# Patient Record
Sex: Female | Born: 1965
Health system: Southern US, Community
[De-identification: ages and names within clinical notes are randomized; demographics above are authoritative.]

## PROBLEM LIST (undated history)

## (undated) DIAGNOSIS — M069 Rheumatoid arthritis, unspecified: Secondary | ICD-10-CM

## (undated) DIAGNOSIS — M1990 Unspecified osteoarthritis, unspecified site: Secondary | ICD-10-CM

## (undated) DIAGNOSIS — M5136 Other intervertebral disc degeneration, lumbar region: Secondary | ICD-10-CM

## (undated) DIAGNOSIS — J45909 Unspecified asthma, uncomplicated: Secondary | ICD-10-CM

## (undated) DIAGNOSIS — M818 Other osteoporosis without current pathological fracture: Secondary | ICD-10-CM

## (undated) DIAGNOSIS — M51369 Other intervertebral disc degeneration, lumbar region without mention of lumbar back pain or lower extremity pain: Secondary | ICD-10-CM

## (undated) DIAGNOSIS — G479 Sleep disorder, unspecified: Secondary | ICD-10-CM

## (undated) DIAGNOSIS — M810 Age-related osteoporosis without current pathological fracture: Secondary | ICD-10-CM

## (undated) HISTORY — PX: ELBOW SURGERY: SHX618

---

## 2015-07-07 ENCOUNTER — Emergency Department (HOSPITAL_COMMUNITY): Payer: Self-pay

## 2015-07-07 ENCOUNTER — Encounter (HOSPITAL_COMMUNITY): Payer: Self-pay | Admitting: Emergency Medicine

## 2015-07-07 ENCOUNTER — Emergency Department (HOSPITAL_COMMUNITY)
Admission: EM | Admit: 2015-07-07 | Discharge: 2015-07-07 | Disposition: A | Discharge status: Home / Self Care | Payer: Self-pay | Attending: Emergency Medicine | Admitting: Emergency Medicine

## 2015-07-07 DIAGNOSIS — M5442 Lumbago with sciatica, left side: Secondary | ICD-10-CM | POA: Insufficient documentation

## 2015-07-07 DIAGNOSIS — M1990 Unspecified osteoarthritis, unspecified site: Secondary | ICD-10-CM | POA: Insufficient documentation

## 2015-07-07 DIAGNOSIS — F17210 Nicotine dependence, cigarettes, uncomplicated: Secondary | ICD-10-CM | POA: Insufficient documentation

## 2015-07-07 HISTORY — DX: Unspecified osteoarthritis, unspecified site: M19.90

## 2015-07-07 MED ORDER — CYCLOBENZAPRINE HCL 10 MG PO TABS
10.0000 mg | ORAL_TABLET | Freq: Once | ORAL | Status: AC
  Administered 2015-07-07: 10 mg via ORAL
  Filled 2015-07-07: qty 1

## 2015-07-07 MED ORDER — KETOROLAC TROMETHAMINE 60 MG/2ML IM SOLN
60.0000 mg | Freq: Once | INTRAMUSCULAR | Status: AC
  Administered 2015-07-07: 60 mg via INTRAMUSCULAR
  Filled 2015-07-07: qty 2

## 2015-07-07 MED ORDER — CYCLOBENZAPRINE HCL 10 MG PO TABS: 10.0000 mg | ORAL_TABLET | Freq: Three times a day (TID) | ORAL | Status: DC | PRN

## 2015-07-07 MED ORDER — OXYCODONE-ACETAMINOPHEN 5-325 MG PO TABS
1.0000 | ORAL_TABLET | Freq: Once | ORAL | Status: AC
  Administered 2015-07-07: 1 via ORAL
  Filled 2015-07-07: qty 1

## 2015-07-07 MED ORDER — PREDNISONE 50 MG PO TABS
60.0000 mg | ORAL_TABLET | Freq: Once | ORAL | Status: AC
  Administered 2015-07-07: 60 mg via ORAL
  Filled 2015-07-07: qty 1

## 2015-07-07 MED ORDER — OXYCODONE-ACETAMINOPHEN 5-325 MG PO TABS: 1.0000 | ORAL_TABLET | ORAL | Status: DC | PRN

## 2015-07-07 NOTE — ED Notes (Signed)
MD at bedside. 

## 2015-07-07 NOTE — ED Notes (Signed)
Pt reports LT lower back pain after a fall getting into the shower. States she injured it initially a few weeks ago. States pain radiates to LT buttock and down LT leg. Pt ambulatory.

## 2015-07-07 NOTE — ED Provider Notes (Signed)
CSN: [id number]     Arrival date & time 07/07/15  1129 History   First MD Initiated Contact with Patient 07/07/15 1148     Chief Complaint  Patient presents with  . Back Pain     (Consider location/radiation/quality/duration/timing/severity/associated sxs/prior Treatment) HPI   Amy Hall is a 50 y.o. female with chronic low back pain who presents to the Emergency Department complaining of worsening left lower back pain that began suddenly after a fall in the shower earlier on the day of arrival.  She reports a prior injury several weeks ago and was seen at [facility].  She was treated with pain medications with minimal relief.  She has scheduled an appt with neurology in July.  She describes a sharp radiating pain to her left buttock and into her leg.  Pain worse with walking.  She denies fever, chills, abdominal pain, numbness or weakness of the LE, urine or bowel changes.   Past Medical History  Diagnosis Date  . Arthritis    Past Surgical History  Procedure Laterality Date  . Elbow surgery     No family history on file. Social History  Substance Use Topics  . Smoking status: Current Every Day Smoker -- 0.50 packs/day    Types: Cigarettes  . Smokeless tobacco: None  . Alcohol Use: No   OB History    No data available     Review of Systems  Constitutional: Negative for fever.  Respiratory: Negative for shortness of breath.   Gastrointestinal: Negative for vomiting, abdominal pain and constipation.  Genitourinary: Negative for dysuria, hematuria, flank pain, decreased urine volume and difficulty urinating.  Musculoskeletal: Positive for back pain. Negative for joint swelling.  Skin: Negative for rash.  Neurological: Negative for weakness and numbness.  All other systems reviewed and are negative.     Allergies  Review of patient's allergies indicates no known allergies.  Home Medications   Prior to Admission medications   Not on File   BP 113/78  mmHg  Pulse 88  Temp(Src) 98.6 F (37 C) (Oral)  Resp 20  Ht 5\' 3"  (1.6 m)  Wt 58.968 kg  BMI 23.03 kg/m2  SpO2 98%  LMP 01/16/2015 Physical Exam  Constitutional: She is oriented to person, place, and time. She appears well-developed and well-nourished. No distress.  HENT:  Head: Normocephalic and atraumatic.  Neck: Normal range of motion. Neck supple.  Cardiovascular: Normal rate, regular rhythm, normal heart sounds and intact distal pulses.   No murmur heard. Pulmonary/Chest: Effort normal and breath sounds normal. No respiratory distress.  Abdominal: Soft. She exhibits no distension. There is no tenderness.  Musculoskeletal: She exhibits tenderness. She exhibits no edema.       Lumbar back: She exhibits tenderness and pain. She exhibits normal range of motion, no swelling, no deformity, no laceration and normal pulse.  ttp of the lumbar spine and left lumbar paraspinal muscles.  DP pulses are brisk and symmetrical.  Distal sensation intact.  Pt has 4/5 strength against resistance of bilateral lower extremities.     Neurological: She is alert and oriented to person, place, and time. She has normal strength. No sensory deficit. She exhibits normal muscle tone. Coordination and gait normal.  Reflex Scores:      Patellar reflexes are 2+ on the right side and 2+ on the left side.      Achilles reflexes are 2+ on the right side and 2+ on the left side. Skin: Skin is warm and dry. No rash  noted.  Nursing note and vitals reviewed.   ED Course  Procedures (including critical care time) Labs Review Labs Reviewed - No data to display  Imaging Review Dg Lumbar Spine Complete  07/07/2015  CLINICAL DATA:  Fall today.  Back pain EXAM: LUMBAR SPINE - COMPLETE 4+ VIEW COMPARISON:  None. FINDINGS: Mild dextroscoliosis. Normal alignment. Mild fracture of L1 which appears chronic. No acute fracture. Disc degeneration and spurring on the left at L3-4 and on the right L4-5. No pars defect.  IMPRESSION: Chronic fracture of L1.  No acute fracture. Electronically Signed   By: Franchot Gallo M.D.   On: 07/07/2015 12:42    I have personally reviewed and evaluated these images and lab results as part of my medical decision-making.   EKG Interpretation None      MDM   Final diagnoses:  Left-sided low back pain with left-sided sciatica    Pt well appearing.  No focal neuor deficits.  No concerning sx's for emergent neurological process.  Pt ambulates with a slightly antalgic gait.  No foot drop  Feeling better after medication. Return precautions given     Pt has upcoming appt with neurology.     Kem Parkinson, PA-C 07/09/15 2154  Forde Dandy, MD 07/12/15 [phone]

## 2015-07-22 ENCOUNTER — Emergency Department (HOSPITAL_COMMUNITY)
Admission: EM | Admit: 2015-07-22 | Discharge: 2015-07-22 | Disposition: A | Discharge status: Home / Self Care | Payer: Medicaid Other | Attending: Emergency Medicine | Admitting: Emergency Medicine

## 2015-07-22 ENCOUNTER — Encounter (HOSPITAL_COMMUNITY): Payer: Self-pay | Admitting: Emergency Medicine

## 2015-07-22 DIAGNOSIS — M1990 Unspecified osteoarthritis, unspecified site: Secondary | ICD-10-CM | POA: Insufficient documentation

## 2015-07-22 DIAGNOSIS — M5442 Lumbago with sciatica, left side: Secondary | ICD-10-CM | POA: Insufficient documentation

## 2015-07-22 DIAGNOSIS — F17210 Nicotine dependence, cigarettes, uncomplicated: Secondary | ICD-10-CM | POA: Insufficient documentation

## 2015-07-22 MED ORDER — PREDNISONE 50 MG PO TABS
60.0000 mg | ORAL_TABLET | Freq: Once | ORAL | Status: AC
  Administered 2015-07-22: 60 mg via ORAL
  Filled 2015-07-22: qty 1

## 2015-07-22 MED ORDER — CYCLOBENZAPRINE HCL 5 MG PO TABS: 5.0000 mg | ORAL_TABLET | Freq: Three times a day (TID) | ORAL | Status: DC | PRN

## 2015-07-22 MED ORDER — PREDNISONE 10 MG PO TABS: ORAL_TABLET | ORAL | Status: DC

## 2015-07-22 MED ORDER — OXYCODONE-ACETAMINOPHEN 5-325 MG PO TABS: 1.0000 | ORAL_TABLET | ORAL | Status: DC | PRN

## 2015-07-22 MED ORDER — OXYCODONE-ACETAMINOPHEN 5-325 MG PO TABS
1.0000 | ORAL_TABLET | Freq: Once | ORAL | Status: AC
  Administered 2015-07-22: 1 via ORAL
  Filled 2015-07-22: qty 1

## 2015-07-22 NOTE — Discharge Instructions (Signed)
Sciatica °Sciatica is pain, weakness, numbness, or tingling along the path of the sciatic nerve. The nerve starts in the lower back and runs down the back of each leg. The nerve controls the muscles in the lower leg and in the back of the knee, while also providing sensation to the back of the thigh, lower leg, and the sole of your foot. Sciatica is a symptom of another medical condition. For instance, nerve damage or certain conditions, such as a herniated disk or bone spur on the spine, pinch or put pressure on the sciatic nerve. This causes the pain, weakness, or other sensations normally associated with sciatica. Generally, sciatica only affects one side of the body. °CAUSES  °· Herniated or slipped disc. °· Degenerative disk disease. °· A pain disorder involving the narrow muscle in the buttocks (piriformis syndrome). °· Pelvic injury or fracture. °· Pregnancy. °· Tumor (rare). °SYMPTOMS  °Symptoms can vary from mild to very severe. The symptoms usually travel from the low back to the buttocks and down the back of the leg. Symptoms can include: °· Mild tingling or dull aches in the lower back, leg, or hip. °· Numbness in the back of the calf or sole of the foot. °· Burning sensations in the lower back, leg, or hip. °· Sharp pains in the lower back, leg, or hip. °· Leg weakness. °· Severe back pain inhibiting movement. °These symptoms may get worse with coughing, sneezing, laughing, or prolonged sitting or standing. Also, being overweight may worsen symptoms. °DIAGNOSIS  °Your caregiver will perform a physical exam to look for common symptoms of sciatica. He or she may ask you to do certain movements or activities that would trigger sciatic nerve pain. Other tests may be performed to find the cause of the sciatica. These may include: °· Blood tests. °· X-rays. °· Imaging tests, such as an MRI or CT scan. °TREATMENT  °Treatment is directed at the cause of the sciatic pain. Sometimes, treatment is not necessary  and the pain and discomfort goes away on its own. If treatment is needed, your caregiver may suggest: °· Over-the-counter medicines to relieve pain. °· Prescription medicines, such as anti-inflammatory medicine, muscle relaxants, or narcotics. °· Applying heat or ice to the painful area. °· Steroid injections to lessen pain, irritation, and inflammation around the nerve. °· Reducing activity during periods of pain. °· Exercising and stretching to strengthen your abdomen and improve flexibility of your spine. Your caregiver may suggest losing weight if the extra weight makes the back pain worse. °· Physical therapy. °· Surgery to eliminate what is pressing or pinching the nerve, such as a bone spur or part of a herniated disk. °HOME CARE INSTRUCTIONS  °· Only take over-the-counter or prescription medicines for pain or discomfort as directed by your caregiver. °· Apply ice to the affected area for 20 minutes, 3-4 times a day for the first 48-72 hours. Then try heat in the same way. °· Exercise, stretch, or perform your usual activities if these do not aggravate your pain. °· Attend physical therapy sessions as directed by your caregiver. °· Keep all follow-up appointments as directed by your caregiver. °· Do not wear high heels or shoes that do not provide proper support. °· Check your mattress to see if it is too soft. A firm mattress may lessen your pain and discomfort. °SEEK IMMEDIATE MEDICAL CARE IF:  °· You lose control of your bowel or bladder (incontinence). °· You have increasing weakness in the lower back, pelvis, buttocks,   or legs.  You have redness or swelling of your back.  You have a burning sensation when you urinate.  You have pain that gets worse when you lie down or awakens you at night.  Your pain is worse than you have experienced in the past.  Your pain is lasting longer than 4 weeks.  You are suddenly losing weight without reason. MAKE SURE YOU:  Understand these  instructions.  Will watch your condition.  Will get help right away if you are not doing well or get worse.   This information is not intended to replace advice given to you by your health care provider. Make sure you discuss any questions you have with your health care provider.   Document Released: 12/26/2000 Document Revised: 09/22/2014 Document Reviewed: 05/13/2011 Elsevier Interactive Patient Education 2016 [place] your next dose of prednisone tomorrow morning.  Use the the other medicines as directed.  Do not drive within 4 hours of taking oxycodone as this will make you drowsy.  Avoid lifting,  Bending,  Twisting or any other activity that worsens your pain over the next week.  Try using a heating pad to your lower back 20 minutes 3-4 times daily.  Keep your appointment with neurology as you have scheduled.  Get rechecked sooner for any weakness in your leg(s) or loss of bladder or bowel function - these are potential symptoms of a worsening condition.

## 2015-07-22 NOTE — ED Notes (Signed)
Back injury from fall 3 weeks ago, pt was given flexeril and prednisone, pt states she ran out yesterday and pain is worse today. Pt has appointment with neuro 7/18

## 2015-07-23 NOTE — ED Provider Notes (Signed)
CSN: [id number]     Arrival date & time 07/22/15  1631 History   First MD Initiated Contact with Patient 07/22/15 1653     Chief Complaint  Patient presents with  . Back Pain     (Consider location/radiation/quality/duration/timing/severity/associated sxs/prior Treatment) The history is provided by the patient.   Amy Hall is a 50 y.o. female presenting with a history of chronic back pain, worsened since sustaining a slip and fall in the shower 3 weeks ago.  Prior to this injury she had been scheduled an appointment with a neurologist in [place] after being seen at [facility] for a prior fall. Her pain is sharp and radiates into her left posterior thigh to her knee. Movement and walking worsens pain and is relieved supine on her side with her knees and hips flexed.  She denies numbness or weakness in her legs, also denies fevers, chills, urinary or fecal incontinence or retention.  She was prescribed flexeril and oxycodone at her last visit here.  Imaging at that time was revealing for a chronic appearing compression fracture at L1.   Past Medical History  Diagnosis Date  . Arthritis    Past Surgical History  Procedure Laterality Date  . Elbow surgery     No family history on file. Social History  Substance Use Topics  . Smoking status: Current Every Day Smoker -- 1.00 packs/day    Types: Cigarettes  . Smokeless tobacco: None  . Alcohol Use: No   OB History    No data available     Review of Systems  Constitutional: Negative for fever.  Respiratory: Negative for shortness of breath.   Cardiovascular: Negative for chest pain and leg swelling.  Gastrointestinal: Negative for abdominal pain, constipation and abdominal distention.  Genitourinary: Negative for dysuria, urgency, frequency, flank pain and difficulty urinating.  Musculoskeletal: Positive for back pain. Negative for joint swelling and gait problem.  Skin: Negative for rash.  Neurological: Negative for weakness  and numbness.      Allergies  Review of patient's allergies indicates no known allergies.  Home Medications   Prior to Admission medications   Medication Sig Start Date End Date Taking? Authorizing Provider  cyclobenzaprine (FLEXERIL) 5 MG tablet Take 1 tablet (5 mg total) by mouth 3 (three) times daily as needed for muscle spasms. 07/22/15   Evalee Jefferson, PA-C  oxyCODONE-acetaminophen (PERCOCET/ROXICET) 5-325 MG tablet Take 1 tablet by mouth every 4 (four) hours as needed. 07/22/15   Evalee Jefferson, PA-C  predniSONE (DELTASONE) 10 MG tablet 6, 5, 4, 3, 2 then 1 tablet by mouth daily for 6 days total. 07/23/15   Evalee Jefferson, PA-C   BP 122/81 mmHg  Pulse 87  Temp(Src) 97.9 F (36.6 C) (Oral)  Resp 18  Ht 5\' 2"  (1.575 m)  Wt 58.968 kg  BMI 23.77 kg/m2  SpO2 98%  LMP 01/16/2015 Physical Exam  Constitutional: She appears well-developed and well-nourished.  HENT:  Head: Normocephalic.  Eyes: Conjunctivae are normal.  Neck: Normal range of motion. Neck supple.  Cardiovascular: Normal rate and intact distal pulses.   Pedal pulses normal.  Pulmonary/Chest: Effort normal.  Abdominal: Soft. Bowel sounds are normal. She exhibits no distension and no mass.  Musculoskeletal: Normal range of motion. She exhibits no edema.       Lumbar back: She exhibits tenderness and bony tenderness. She exhibits no swelling, no edema and no spasm.  ttp midlumbar midline. No stepoffs.  Positive SLR left.  Neurological: She is alert. She has  normal strength. She displays no atrophy and no tremor. No sensory deficit. Gait normal.  Reflex Scores:      Patellar reflexes are 2+ on the right side and 2+ on the left side.      Achilles reflexes are 2+ on the right side and 2+ on the left side. No strength deficit noted in hip and knee flexor and extensor muscle groups.  Ankle flexion and extension intact. No footdrop.  Slight antalgic gait favoring left.  Skin: Skin is warm and dry.  Psychiatric: She has a normal mood  and affect.  Nursing note and vitals reviewed.   ED Course  Procedures (including critical care time) Labs Review Labs Reviewed - No data to display  Imaging Review No results found. I have personally reviewed and evaluated these images and lab results as part of my medical decision-making.   EKG Interpretation None      MDM   Final diagnoses:  Left-sided low back pain with left-sided sciatica    Imaging from prior visit reviewed.  Pt with no emergent findings today. Encouraged to keep appt with neurology.  Prescribed oxycodone, [place] database reviewed first.  Prednisone taper, flexeril.  Activity as tolerated. Heat tx.  No neuro deficit on exam or by history to suggest emergent or surgical presentation.  Also discussed worsened sx that should prompt immediate re-evaluation including distal weakness, bowel/bladder retention/incontinence.          Evalee Jefferson, PA-C 07/25/15 1227  Merrily Pew, MD 07/27/15 [phone]

## 2015-08-02 ENCOUNTER — Encounter (HOSPITAL_COMMUNITY): Payer: Self-pay

## 2015-08-02 ENCOUNTER — Emergency Department (HOSPITAL_COMMUNITY)
Admission: EM | Admit: 2015-08-02 | Discharge: 2015-08-02 | Disposition: A | Discharge status: Home / Self Care | Payer: Medicaid Other | Attending: Emergency Medicine | Admitting: Emergency Medicine

## 2015-08-02 DIAGNOSIS — Z791 Long term (current) use of non-steroidal anti-inflammatories (NSAID): Secondary | ICD-10-CM | POA: Insufficient documentation

## 2015-08-02 DIAGNOSIS — Z79899 Other long term (current) drug therapy: Secondary | ICD-10-CM | POA: Insufficient documentation

## 2015-08-02 DIAGNOSIS — G8929 Other chronic pain: Secondary | ICD-10-CM | POA: Insufficient documentation

## 2015-08-02 DIAGNOSIS — F17210 Nicotine dependence, cigarettes, uncomplicated: Secondary | ICD-10-CM | POA: Insufficient documentation

## 2015-08-02 DIAGNOSIS — M1990 Unspecified osteoarthritis, unspecified site: Secondary | ICD-10-CM | POA: Insufficient documentation

## 2015-08-02 DIAGNOSIS — M5442 Lumbago with sciatica, left side: Secondary | ICD-10-CM

## 2015-08-02 DIAGNOSIS — M5432 Sciatica, left side: Secondary | ICD-10-CM

## 2015-08-02 MED ORDER — PREDNISONE 50 MG PO TABS
60.0000 mg | ORAL_TABLET | Freq: Once | ORAL | Status: AC
  Administered 2015-08-02: 60 mg via ORAL
  Filled 2015-08-02: qty 1

## 2015-08-02 MED ORDER — CYCLOBENZAPRINE HCL 10 MG PO TABS
10.0000 mg | ORAL_TABLET | Freq: Once | ORAL | Status: AC
  Administered 2015-08-02: 10 mg via ORAL
  Filled 2015-08-02: qty 1

## 2015-08-02 MED ORDER — PREDNISONE 10 MG (21) PO TBPK: 10.0000 mg | ORAL_TABLET | Freq: Every day | ORAL | Status: DC

## 2015-08-02 MED ORDER — CYCLOBENZAPRINE HCL 5 MG PO TABS: 5.0000 mg | ORAL_TABLET | Freq: Three times a day (TID) | ORAL | Status: DC | PRN

## 2015-08-02 MED ORDER — OXYCODONE-ACETAMINOPHEN 5-325 MG PO TABS
2.0000 | ORAL_TABLET | Freq: Once | ORAL | Status: AC
  Administered 2015-08-02: 2 via ORAL
  Filled 2015-08-02: qty 2

## 2015-08-02 NOTE — ED Provider Notes (Signed)
CSN: [id number]     Arrival date & time 08/02/15  [id number] History   First MD Initiated Contact with Patient 08/02/15 1008     Chief Complaint  Patient presents with  . Back Pain     (Consider location/radiation/quality/duration/timing/severity/associated sxs/prior Treatment) Patient is a 50 y.o. female presenting with back pain. The history is provided by the patient.  Back Pain Location:  Lumbar spine Quality:  Aching and stiffness Stiffness is present:  All day Radiates to:  Does not radiate Pain severity:  Severe Pain is:  Same all the time Onset quality:  Sudden Duration:  4 weeks Timing:  Constant Progression:  Worsening Chronicity:  New Context: recent injury   Context: not physical stress, not recent illness and not twisting   Relieved by:  OTC medications and muscle relaxants (steroids) Worsened by:  Movement and palpation Ineffective treatments:  None tried Associated symptoms: no abdominal pain, no bladder incontinence, no bowel incontinence, no chest pain, no fever, no numbness, no pelvic pain, no perianal numbness and no weakness     Past Medical History  Diagnosis Date  . Arthritis    Past Surgical History  Procedure Laterality Date  . Elbow surgery     No family history on file. Social History  Substance Use Topics  . Smoking status: Current Every Day Smoker -- 1.00 packs/day    Types: Cigarettes  . Smokeless tobacco: None  . Alcohol Use: No   OB History    No data available     Review of Systems  Constitutional: Negative for fever and chills.  HENT: Negative for congestion, rhinorrhea and sore throat.   Eyes: Negative for visual disturbance.  Respiratory: Negative for cough and shortness of breath.   Cardiovascular: Negative for chest pain and leg swelling.  Gastrointestinal: Negative for nausea, vomiting, abdominal pain, diarrhea and bowel incontinence.  Genitourinary: Negative for bladder incontinence, flank pain and pelvic pain.   Musculoskeletal: Positive for back pain. Negative for neck pain and neck stiffness.  Skin: Negative for rash.  Neurological: Negative for weakness and numbness.  Hematological: Does not bruise/bleed easily.      Allergies  Review of patient's allergies indicates no known allergies.  Home Medications   Prior to Admission medications   Medication Sig Start Date End Date Taking? Authorizing Provider  ibuprofen (ADVIL,MOTRIN) 200 MG tablet Take 800 mg by mouth every 6 (six) hours as needed (swelling).   Yes Historical Provider, MD  oxyCODONE-acetaminophen (PERCOCET/ROXICET) 5-325 MG tablet Take 1 tablet by mouth every 4 (four) hours as needed. 07/22/15  Yes Almyra Free Idol, PA-C  cyclobenzaprine (FLEXERIL) 5 MG tablet Take 1 tablet (5 mg total) by mouth 3 (three) times daily as needed for muscle spasms. 08/02/15   Duffy Bruce, MD  predniSONE (STERAPRED UNI-PAK 21 TAB) 10 MG (21) TBPK tablet Take 1 tablet (10 mg total) by mouth daily. Take 6 tabs by mouth daily  for 2 days, then 5 tabs for 2 days, then 4 tabs for 2 days, then 3 tabs for 2 days, 2 tabs for 2 days, then 1 tab by mouth daily for 2 days 08/02/15   Duffy Bruce, MD   BP 124/86 mmHg  Pulse 104  Temp(Src) 98.2 F (36.8 C) (Oral)  Resp 18  Ht 5\' 2"  (1.575 m)  Wt 130 lb (58.968 kg)  BMI 23.77 kg/m2  SpO2 99%  LMP 01/16/2015 Physical Exam  Constitutional: She is oriented to person, place, and time. She appears well-developed and well-nourished. No distress.  HENT:  Head: Normocephalic.  Mouth/Throat: No oropharyngeal exudate.  Eyes: Conjunctivae are normal. Pupils are equal, round, and reactive to light.  Neck: Normal range of motion. Neck supple.  Cardiovascular: Normal rate, regular rhythm, normal heart sounds and intact distal pulses.  Exam reveals no friction rub.   No murmur heard. Pulmonary/Chest: Effort normal and breath sounds normal. No respiratory distress. She has no wheezes. She has no rales.  Abdominal: Soft.  Bowel sounds are normal. She exhibits no distension. There is no tenderness.  Musculoskeletal: She exhibits no edema.  Mild TTP over right>left paraspinal lower lumbar spine. No midline TTP. No deformity or step-offs.  Neurological: She is alert and oriented to person, place, and time.  Strength 5/5 in proximal and distal LE bilaterally Sensation fully intact to light touch in prox and distal LE b/l Reflexes 2+ at patella and achilles Babinski normal b/l  Skin: Skin is warm. No rash noted.  Vitals reviewed.   ED Course  Procedures (including critical care time) Labs Review Labs Reviewed - No data to display  Imaging Review No results found. I have personally reviewed and evaluated these images and lab results as part of my medical decision-making.   EKG Interpretation None      MDM  50 yo F with reported PMHx of RA on chronic prednisone, recent fall 3 weeks ago who p/w worsening back and generalized pain after running out of prednisone and muscle relaxants. Pt has known L1 fx and was seen on 6/24 as well as 7/12 for these sx. Plain films showed no new fx. Currently, she denies any new falls or neurological sx. VSS and WNL.  Regarding her back pain, there are no new red flag symptoms. No signs of weakness, numbness, or other neurological compromise. No loss of bowel or bladder continence, perianal anesthesia, or signs of cauda equina. No fever, midline TTP, or signs of osteomyelitis or epidural abscess. No falls since episode and do not feel acute bony pathology or repeat imaging is indicated. No urinary symptoms. Will treat with prolonged course of nsaids, muscle relaxants. Advised continued movement, stretching, and PCP f/u.  Regarding her generalized as well as back pain, there is likely a component of RA as well. Of note, pt states she has been on prednisone 10 BID for "years" and recently ran out. She stopped her prescribed taper several days ago. She currently has normal HR, BP,  and no signs of adrenal criss or insufficiency. I discussed the danger and risks of discontinuing steroids in detail, and reiterated that it is imperative she sets upa  PCP appt in 1 week to discuss her chronic med management. Will give a prolonged steroid taper for both her back pain and chronic RA pain, for 12 days. I discussed that before she finishes this pack, she needs to f/u with a PCP to discuss chronic weaning or maintenance. I advised her to return if she is unable to f/u before steroid taper is complete and discussed risks of discontinuation. She is in agreement.  Clinical Impression: 1. Left-sided low back pain with left-sided sciatica   2. Chronic sciatica, left     Disposition: Discharge  Condition: Good  I have discussed the results, Dx and Tx plan with the pt(& family if present). He/she/they expressed understanding and agree(s) with the plan. Discharge instructions discussed at great length. Strict return precautions discussed and pt &/or family have verbalized understanding of the instructions. No further questions at time of discharge.    Discharge Medication  List as of 08/02/2015 11:21 AM    START taking these medications   Details  predniSONE (STERAPRED UNI-PAK 21 TAB) 10 MG (21) TBPK tablet Take 1 tablet (10 mg total) by mouth daily. Take 6 tabs by mouth daily  for 2 days, then 5 tabs for 2 days, then 4 tabs for 2 days, then 3 tabs for 2 days, 2 tabs for 2 days, then 1 tab by mouth daily for 2 days, Starting 08/02/2015, Until Discontinued, P rint        Follow Up: [place] [address] [place] [phone]  Call this number to set up an appointment with one of our primary care doctors for [place] medication management  [facility] [id number] [place] [address] [phone]   Call for PCP if unable to set up an appointment as above  [facility] [place] [address] [phone]   Call for PCP if unable to set up an appointment as above     Duffy Bruce, MD 08/02/15 [phone]

## 2015-08-02 NOTE — ED Notes (Signed)
Pt reports has arthritis and was recently on prednisone.  Pt says she took the prednisone SUnday.  Pt c/o pain in lower back radiating down left leg.  Pt ambulatory with a crutch.

## 2015-08-18 ENCOUNTER — Ambulatory Visit: Payer: Self-pay | Attending: Internal Medicine | Admitting: Physician Assistant

## 2015-08-18 VITALS — BP 116/82 | HR 58 | Temp 98.0°F | Resp 16 | Wt 127.8 lb

## 2015-08-18 DIAGNOSIS — M459 Ankylosing spondylitis of unspecified sites in spine: Secondary | ICD-10-CM

## 2015-08-18 DIAGNOSIS — M5441 Lumbago with sciatica, right side: Secondary | ICD-10-CM

## 2015-08-18 MED ORDER — PREDNISONE 10 MG PO TABS: 10.0000 mg | ORAL_TABLET | Freq: Two times a day (BID) | ORAL | 1 refills | Status: DC

## 2015-08-18 MED ORDER — CYCLOBENZAPRINE HCL 5 MG PO TABS: 5.0000 mg | ORAL_TABLET | Freq: Three times a day (TID) | ORAL | 0 refills | Status: DC | PRN

## 2015-08-18 MED ORDER — ACETAMINOPHEN-CODEINE #3 300-30 MG PO TABS: 1.0000 | ORAL_TABLET | ORAL | 0 refills | Status: DC | PRN
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
## 2015-08-18 NOTE — Progress Notes (Signed)
Patient is in the office today for a ED follow-up Pt pain level today in the office is a 8 Pt states the pain shoots to her left leg and the right foot is numb Pt states she can't sit still, she can't lay flat

## 2015-08-18 NOTE — Progress Notes (Signed)
Patient denies SI/HI

## 2015-08-24 ENCOUNTER — Ambulatory Visit: Payer: Medicaid Other

## 2015-08-29 ENCOUNTER — Ambulatory Visit (HOSPITAL_COMMUNITY)
Admission: RE | Admit: 2015-08-29 | Discharge: 2015-08-29 | Disposition: A | Discharge status: Home / Self Care | Payer: Medicaid Other | Source: Ambulatory Visit | Attending: Physician Assistant | Admitting: Physician Assistant

## 2015-08-29 DIAGNOSIS — M5136 Other intervertebral disc degeneration, lumbar region: Secondary | ICD-10-CM | POA: Insufficient documentation

## 2015-08-29 DIAGNOSIS — M5441 Lumbago with sciatica, right side: Secondary | ICD-10-CM

## 2015-08-29 DIAGNOSIS — M4806 Spinal stenosis, lumbar region: Secondary | ICD-10-CM | POA: Insufficient documentation

## 2015-09-01 ENCOUNTER — Telehealth: Payer: Self-pay

## 2015-09-01 ENCOUNTER — Other Ambulatory Visit: Payer: Self-pay | Admitting: Physician Assistant

## 2015-09-01 DIAGNOSIS — R937 Abnormal findings on diagnostic imaging of other parts of musculoskeletal system: Secondary | ICD-10-CM

## 2015-09-01 NOTE — Telephone Encounter (Signed)
-----   Message from Argentina Donovan, [place] sent at 09/01/2015  9:21 AM EDT ----- Please call patient and let her know that her MRI showed some narrowing of disc space, arthritis, and possible nerve compression.  I am going to refer her to a neurosurgeon in addition to pain management.    Thanks, Freeman Caldron, PA-C

## 2015-09-01 NOTE — Telephone Encounter (Signed)
Clld pt - [place] re MRI results.

## 2015-09-02 ENCOUNTER — Ambulatory Visit: Payer: Self-pay | Attending: Family Medicine | Admitting: Family Medicine

## 2015-09-02 ENCOUNTER — Encounter: Payer: Self-pay | Admitting: Family Medicine

## 2015-09-02 VITALS — BP 121/79 | HR 133 | Temp 98.3°F | Ht 62.0 in | Wt 129.4 lb

## 2015-09-02 DIAGNOSIS — R000 Tachycardia, unspecified: Secondary | ICD-10-CM | POA: Insufficient documentation

## 2015-09-02 DIAGNOSIS — M48061 Spinal stenosis, lumbar region without neurogenic claudication: Secondary | ICD-10-CM | POA: Insufficient documentation

## 2015-09-02 DIAGNOSIS — M4806 Spinal stenosis, lumbar region: Secondary | ICD-10-CM | POA: Insufficient documentation

## 2015-09-02 DIAGNOSIS — M069 Rheumatoid arthritis, unspecified: Secondary | ICD-10-CM | POA: Insufficient documentation

## 2015-09-02 DIAGNOSIS — M5136 Other intervertebral disc degeneration, lumbar region: Secondary | ICD-10-CM | POA: Insufficient documentation

## 2015-09-02 LAB — CBC WITH DIFFERENTIAL/PLATELET
BASOS PCT: 0 %
Basophils Absolute: 0 cells/uL (ref 0–200)
EOS ABS: 0 {cells}/uL — AB (ref 15–500)
EOS PCT: 0 %
HEMATOCRIT: 38.8 % (ref 35.0–45.0)
Hemoglobin: 12.2 g/dL (ref 11.7–15.5)
LYMPHS PCT: 14 %
Lymphs Abs: 1694 cells/uL (ref 850–3900)
MCH: 26.3 pg — ABNORMAL LOW (ref 27.0–33.0)
MCHC: 31.4 g/dL — AB (ref 32.0–36.0)
MCV: 83.8 fL (ref 80.0–100.0)
MONO ABS: 484 {cells}/uL (ref 200–950)
MPV: 8.5 fL (ref 7.5–12.5)
Monocytes Relative: 4 %
Neutro Abs: 9922 cells/uL — ABNORMAL HIGH (ref 1500–7800)
Neutrophils Relative %: 82 %
Platelets: 387 10*3/uL (ref 140–400)
RBC: 4.63 MIL/uL (ref 3.80–5.10)
RDW: 14.9 % (ref 11.0–15.0)
WBC: 12.1 10*3/uL — ABNORMAL HIGH (ref 3.8–10.8)

## 2015-09-02 LAB — COMPLETE METABOLIC PANEL WITH GFR
ALT: 12 U/L (ref 6–29)
AST: 12 U/L (ref 10–35)
Albumin: 3.2 g/dL — ABNORMAL LOW (ref 3.6–5.1)
Alkaline Phosphatase: 43 U/L (ref 33–130)
BUN: 12 mg/dL (ref 7–25)
CHLORIDE: 105 mmol/L (ref 98–110)
CO2: 23 mmol/L (ref 20–31)
CREATININE: 0.64 mg/dL (ref 0.50–1.05)
Calcium: 8.4 mg/dL — ABNORMAL LOW (ref 8.6–10.4)
GFR, Est African American: >89 mL/min (ref >=60)
GFR, Est Non African American: >89 mL/min (ref >=60)
GLUCOSE: 183 mg/dL — AB (ref 65–99)
Potassium: 3.3 mmol/L — ABNORMAL LOW (ref 3.5–5.3)
Sodium: 139 mmol/L (ref 135–146)
Total Bilirubin: 0.2 mg/dL (ref 0.2–1.2)
Total Protein: 5.4 g/dL — ABNORMAL LOW (ref 6.1–8.1)

## 2015-09-02 LAB — RHEUMATOID FACTOR: RHEUMATOID FACTOR: 39 [IU]/mL — AB (ref <=14)

## 2015-09-02 NOTE — Patient Instructions (Signed)
Rheumatoid Arthritis  Rheumatoid arthritis is a long-term (chronic) inflammatory disease that causes pain, swelling, and stiffness of the joints. It can affect the entire body, including the eyes and lungs. The effects of rheumatoid arthritis vary widely among those with the condition.  CAUSES  The cause of rheumatoid arthritis is not known. It tends to run in families and is more common in women. Certain cells of the body's natural defense system (immune system) do not work properly and begin to attack healthy joints. It primarily involves the connective tissue that lines the joints (synovial membrane). This can cause damage to the joint.  SYMPTOMS  · Pain, stiffness, swelling, and decreased motion of many joints, especially in the hands and feet.  · Stiffness that is worse in the morning. It may last 1-2 hours or longer.  · Numbness and tingling in the hands.  · Fatigue.  · Loss of appetite.  · Weight loss.  · Low-grade fever.  · Dry eyes and mouth.  · Firm lumps (rheumatoid nodules) that grow beneath the skin in areas such as the elbows and hands.  DIAGNOSIS  Diagnosis is based on the symptoms described, an exam, and blood tests. Sometimes, X-rays are helpful.  TREATMENT  The goals of treatment are to relieve pain, reduce inflammation, and to slow down or stop joint damage and disability. Methods vary and may include:  · Maintaining a balance of rest, exercise, and proper nutrition.  · Your health care provider may adjust your medicines every 3 months until treatment goals are reached. Common medicines include:    Pain relievers (analgesics).    Corticosteroids and nonsteroidal anti-inflammatory drugs (NSAIDs) to reduce inflammation.    Disease-modifying antirheumatic drugs (DMARDs) to try to slow the course of the disease.    Biologic response modifiers to reduce inflammation and damage.  · Physical therapy and occupational therapy.  · Surgery for patients with severe joint damage. Joint replacement or fusing of  joints may be needed.  · Routine monitoring and ongoing care, such as office visits, blood and urine tests, and X-rays.  Your health care provider will work with you to identify the best treatment option for you, based on an assessment of the overall disease activity in your body.  HOME CARE INSTRUCTIONS  · Remain physically active and reduce activity when the disease gets worse.  · Eat a well-balanced diet.  · Put heat on affected joints when you wake up and before activities. Keep the heat on the affected joint for as long as directed by your health care provider.  · Put ice on affected joints following activities or exercising.    Put ice in a plastic bag.    Place a towel between your skin and the bag.    Leave the ice on for 15-20 minutes, 3-4 times per day, or as directed by your health care provider.  · Take medicines and supplements only as directed by your health care provider.  · Use splints as directed by your health care provider. Splints help maintain joint position and function.  · Do not sleep with pillows under your knees. This may lead to spasms.  · Participate in a self-management program to keep current with the latest treatment and coping skills.  SEEK IMMEDIATE MEDICAL CARE IF:  · You have fainting episodes.  · You have periods of extreme weakness.  · You rapidly develop a hot, painful joint that is more severe than usual joint aches.  · You have chills.  ·   You have a fever.  FOR MORE INFORMATION  · American College of Rheumatology: www.rheumatology.org  · Arthritis Foundation: www.arthritis.org     This information is not intended to replace advice given to you by your health care provider. Make sure you discuss any questions you have with your health care provider.     Document Released: 12/30/1999 Document Revised: 01/22/2014 Document Reviewed: 02/07/2011  Elsevier Interactive Patient Education ©2016 [employer]vier [employer]

## 2015-09-02 NOTE — Progress Notes (Signed)
MRI results tachycardia

## 2015-09-02 NOTE — Progress Notes (Signed)
Subjective:  Patient ID: Amy Hall, female    DOB: 22-Apr-1965  Age: 50 y.o. MRN: [id number]  CC: Arthritis; Back Pain; Results; and Tachycardia   HPI Amy Hall is a 50 year old female with a history of rheumatoid arthritis who has not been able to see a rheumatologist due to lack of insurance and remains on chronic prednisone;  She comes into the clinic today for a follow-up visit.  At her last visit she was evaluated for low back pain and MRI of the lumbar spine revealed severe degenerative disc disease in L3-L4 and severe spinal stenosis. Her low back pain shoots down her left butt cheek and down the back of her left thigh to her leg and is worse with change in position. She was referred to neurosurgery and pain management by the PA. She denies loss of sphincteric function or foot drop and ambulates with the aid of a cane. Takes Flexeril, Tylenol No. 3 which helped ease the symptoms will abate and she remains on chronic prednisone.  Outpatient Medications Prior to Visit  Medication Sig Dispense Refill  . cyclobenzaprine (FLEXERIL) 5 MG tablet Take 1 tablet (5 mg total) by mouth 3 (three) times daily as needed for muscle spasms. 30 tablet 0  . ibuprofen (ADVIL,MOTRIN) 200 MG tablet Take 800 mg by mouth every 6 (six) hours as needed (swelling).    . predniSONE (DELTASONE) 10 MG tablet Take 1 tablet (10 mg total) by mouth 2 (two) times daily with a meal. 60 tablet 1  . acetaminophen-codeine (TYLENOL #3) 300-30 MG tablet Take 1 tablet by mouth every 4 (four) hours as needed for moderate pain. (Patient not taking: Reported on 09/02/2015) 30 tablet 0   No facility-administered medications prior to visit.     ROS Review of Systems  Constitutional: Negative for activity change, appetite change and fatigue.  HENT: Negative for congestion, sinus pressure and sore throat.   Eyes: Negative for visual disturbance.  Respiratory: Negative for cough, chest tightness, shortness of breath  and wheezing.   Cardiovascular: Negative for chest pain and palpitations.  Gastrointestinal: Negative for abdominal distention, abdominal pain and constipation.  Endocrine: Negative for polydipsia.  Genitourinary: Negative for dysuria and frequency.  Musculoskeletal:       See hpi  Skin: Negative for rash.  Neurological: Negative for tremors, light-headedness and numbness.  Hematological: Does not bruise/bleed easily.  Psychiatric/Behavioral: Negative for agitation and behavioral problems.    Objective:  BP 121/79 (BP Location: Right Arm, Patient Position: Sitting, Cuff Size: Small)   Pulse (!) 133   Temp 98.3 F (36.8 C) (Oral)   Ht 5\' 2"  (1.575 m)   Wt 129 lb 6.4 oz (58.7 kg)   LMP 01/16/2015   SpO2 98%   BMI 23.67 kg/m   BP/Weight 09/02/2015 08/18/2015 [id number]  Systolic BP [id number]  Diastolic BP 79 82 86  Wt. (Lbs) 129.4 127.8 130  BMI 23.67 23.37 23.77      Physical Exam  Constitutional: She is oriented to person, place, and time. She appears well-developed and well-nourished.  Cardiovascular: Normal heart sounds and intact distal pulses.  Tachycardia present.   No murmur heard. Pulmonary/Chest: Effort normal and breath sounds normal. She has no wheezes. She has no rales. She exhibits no tenderness.  Abdominal: Soft. Bowel sounds are normal. She exhibits no distension and no mass. There is no tenderness.  Musculoskeletal: She exhibits tenderness (Lumbar spine tenderness and positive straight leg raise on the left).  Fingers and toes with evidence  of bony rheumatoid deformities  Neurological: She is alert and oriented to person, place, and time.   CLINICAL DATA:  Right-sided low back pain with right-sided sciatica. Right leg paresthesia. Left leg pain. L1 fracture.  EXAM: MRI LUMBAR SPINE WITHOUT CONTRAST  TECHNIQUE: Multiplanar, multisequence MR imaging of the lumbar spine was performed. No intravenous contrast was administered.  COMPARISON:  Lumbar  spine radiographs 07/07/2015  FINDINGS: Segmentation:  Standard.  Alignment: Slight lumbar dextroscoliosis as previously seen. No significant listhesis.  Vertebrae: Chronic L1 superior endplate compression fracture with 40% height loss anteriorly and no residual marrow edema or retropulsion. There is severe disc space narrowing at L3-4 with moderate type 1 endplate changes asymmetric to the left. Small L4 superior endplate Schmorl's node. Disc desiccation throughout the lumbar spine.  Conus medullaris: Extends to the L1 level and appears normal.  Paraspinal and other soft tissues: Unremarkable.  Disc levels:  L1-2:  Mild disc bulging without stenosis.  L2-3:  Minimal disc bulging without stenosis.  L3-4: Circumferential disc bulging, moderate-sized left paracentral disc extrusion with caudal migration to the mid L3 vertebral body level, left greater than right ligamentum flavum thickening, and moderate facet arthrosis result in severe spinal stenosis, moderate left greater than right lateral recess stenosis, and severe left neural foraminal stenosis. The L4 nerve roots may be affected in the lateral recesses and the left L3 nerve may be affected in the neural foramen.  L4-5: Disc bulging asymmetric to the right, slight ligamentum flavum thickening, and mild-to-moderate facet arthrosis result in mild spinal stenosis, mild bilateral lateral recess stenosis, and mild to moderate right neural foraminal stenosis.  L5-S1: Minimal disc bulging and mild facet arthrosis without stenosis.  IMPRESSION: 1. Advanced disc degeneration at L3-4 with severe spinal stenosis in severe left neural foraminal stenosis. 2. Mild spinal stenosis and mild-to-moderate right foraminal stenosis at L4-5.   Electronically Signed   By: Logan Bores M.D.   On: 08/29/2015 15:28  Assessment & Plan:   1. Degenerative disc disease, lumbar Continue Flexeril and Tylenol No. 3  2.  Spinal stenosis of lumbar region No red flags at this time Referral to pain management and neurosurgery pending-advised the patient to apply for the [facility] discount to facilitate referral process  3. Rheumatoid arthritis involving multiple sites, unspecified rheumatoid factor presence (HCC) Continue prednisone-side effects of prednisone discussed Would love her to be on immunomodulatory agent however this will need rheumatology referral and the [place] discount will helped with this referral. - COMPLETE METABOLIC PANEL WITH GFR - CBC with Differential/Platelet - Rheumatoid factor - Cyclic citrul peptide antibody, IgG   No orders of the defined types were placed in this encounter.   Follow-up: Return in about 2 weeks (around 09/16/2015) for Coordination of care.   [name] MD

## 2015-09-05 ENCOUNTER — Other Ambulatory Visit: Payer: Self-pay | Admitting: Family Medicine

## 2015-09-05 LAB — CYCLIC CITRUL PEPTIDE ANTIBODY, IGG

## 2015-09-05 MED ORDER — POTASSIUM CHLORIDE ER 10 MEQ PO TBCR: 10.0000 meq | EXTENDED_RELEASE_TABLET | Freq: Every day | ORAL | 1 refills | Status: DC

## 2015-09-07 ENCOUNTER — Telehealth: Payer: Self-pay

## 2015-09-07 NOTE — Telephone Encounter (Signed)
Writer was ale to reach patient through her daughters phone.  Labs were discussed per Dr. Johna Sheriff recommendations.  Patient is to pick up a potassium supplement that was sent over to her [facility] because her labs showed that she is hypokalemic at 3.3  Patient stated understanding and will start the potassium today.

## 2015-09-07 NOTE — Telephone Encounter (Signed)
-----   Message from [name], MD sent at 09/05/2015  2:19 PM EDT ----- Labs are in keeping with rheumatoid arthritis. She also has hypokalemia and have sent a prescription for potassium pills to her pharmacy.

## 2015-09-08 ENCOUNTER — Ambulatory Visit: Payer: Medicaid Other | Attending: Internal Medicine

## 2015-09-09 ENCOUNTER — Telehealth: Payer: Self-pay

## 2015-09-09 NOTE — Telephone Encounter (Signed)
-----   Message from Argentina Donovan, [place] sent at 09/01/2015  9:21 AM EDT ----- Please call patient and let her know that her MRI showed some narrowing of disc space, arthritis, and possible nerve compression.  I am going to refer her to a neurosurgeon in addition to pain management.    Thanks, Freeman Caldron, PA-C

## 2015-09-09 NOTE — Progress Notes (Signed)
MRI results were advsd by PCP's nurse.

## 2015-09-12 ENCOUNTER — Telehealth: Payer: Self-pay | Admitting: Family Medicine

## 2015-09-12 NOTE — Telephone Encounter (Signed)
Pt called in the office to inform us that she needs a refill for cyclobenzaprine (FLEXERIL) 5 MG tablet. Please follow up.   Thank you

## 2015-09-13 MED ORDER — CYCLOBENZAPRINE HCL 5 MG PO TABS: 5.0000 mg | ORAL_TABLET | Freq: Three times a day (TID) | ORAL | 0 refills | Status: DC | PRN

## 2015-09-13 NOTE — Telephone Encounter (Signed)
Done

## 2015-09-14 NOTE — Telephone Encounter (Signed)
Called patient at [phone], left message advising request is completed.  No further action needed at this time.

## 2015-09-21 ENCOUNTER — Encounter: Payer: Self-pay | Admitting: Family Medicine

## 2015-09-21 ENCOUNTER — Ambulatory Visit: Payer: Self-pay | Attending: Family Medicine | Admitting: Family Medicine

## 2015-09-21 VITALS — BP 117/77 | HR 91 | Temp 98.0°F | Resp 20 | Ht 62.0 in | Wt 134.0 lb

## 2015-09-21 DIAGNOSIS — M5136 Other intervertebral disc degeneration, lumbar region: Secondary | ICD-10-CM | POA: Insufficient documentation

## 2015-09-21 DIAGNOSIS — M4806 Spinal stenosis, lumbar region: Secondary | ICD-10-CM

## 2015-09-21 DIAGNOSIS — Z79899 Other long term (current) drug therapy: Secondary | ICD-10-CM

## 2015-09-21 DIAGNOSIS — E876 Hypokalemia: Secondary | ICD-10-CM

## 2015-09-21 DIAGNOSIS — M48061 Spinal stenosis, lumbar region without neurogenic claudication: Secondary | ICD-10-CM

## 2015-09-21 DIAGNOSIS — M069 Rheumatoid arthritis, unspecified: Secondary | ICD-10-CM

## 2015-09-21 LAB — BASIC METABOLIC PANEL
BUN: 11 mg/dL (ref 7–25)
CALCIUM: 8.7 mg/dL (ref 8.6–10.4)
CO2: 24 mmol/L (ref 20–31)
CREATININE: 0.57 mg/dL (ref 0.50–1.05)
Chloride: 103 mmol/L (ref 98–110)
Glucose, Bld: 93 mg/dL (ref 65–99)
Potassium: 4.1 mmol/L (ref 3.5–5.3)
Sodium: 136 mmol/L (ref 135–146)

## 2015-09-21 LAB — POCT GLYCOSYLATED HEMOGLOBIN (HGB A1C): Hemoglobin A1C: 5.7

## 2015-09-21 MED ORDER — ACETAMINOPHEN-CODEINE #3 300-30 MG PO TABS: 1.0000 | ORAL_TABLET | Freq: Two times a day (BID) | ORAL | 1 refills | Status: DC | PRN

## 2015-09-21 MED ORDER — PREDNISONE 10 MG PO TABS: 10.0000 mg | ORAL_TABLET | Freq: Two times a day (BID) | ORAL | 1 refills | Status: DC

## 2015-09-21 NOTE — Progress Notes (Signed)
Subjective:    Patient ID: Amy Hall, female    DOB: 09/01/1965, 50 y.o.   MRN: [id number]  HPI She is a 50 year old female with a history of rheumatoid arthritis, spinal stenosis who has not been able to see a rheumatologist due to lack of insurance and remains on chronic prednisone;  She comes into the clinic today for a refill of medications  Her MRI of the lumbar spine revealed severe degenerative disc disease in L3-L4 and severe spinal stenosis. Her low back pain shoots down her left butt cheek and down the back of her left thigh to her leg and is worse with change in position. She was referred to neurosurgery and pain management by the PA. She denies loss of sphincteric function or foot drop and ambulates with the aid of a cane. Takes Flexeril, Tylenol No. 3 which helped ease the symptoms and she remains on chronic prednisone.  Past Medical History:  Diagnosis Date  . Arthritis     Past Surgical History:  Procedure Laterality Date  . ELBOW SURGERY      No Known Allergies  Current Outpatient Prescriptions on File Prior to Visit  Medication Sig Dispense Refill  . cyclobenzaprine (FLEXERIL) 5 MG tablet Take 1 tablet (5 mg total) by mouth 3 (three) times daily as needed for muscle spasms. 30 tablet 0  . ibuprofen (ADVIL,MOTRIN) 200 MG tablet Take 800 mg by mouth every 6 (six) hours as needed (swelling).    . Melatonin 5 MG TABS Take 5 mg by mouth Nightly. 2 tabs nightly    . potassium chloride (KLOR-CON 10) 10 MEQ tablet Take 1 tablet (10 mEq total) by mouth daily. 30 tablet 1   No current facility-administered medications on file prior to visit.       Review of Systems Constitutional: Negative for activity change, appetite change and fatigue.  HENT: Negative for congestion, sinus pressure and sore throat.   Eyes: Negative for visual disturbance.  Respiratory: Negative for cough, chest tightness, shortness of breath and wheezing.   Cardiovascular: Negative for chest  pain and palpitations.  Gastrointestinal: Negative for abdominal distention, abdominal pain and constipation.  Endocrine: Negative for polydipsia.  Genitourinary: Negative for dysuria and frequency.  Musculoskeletal:       See hpi  Skin: Negative for rash.  Neurological: Negative for tremors, light-headedness and numbness.  Hematological: Does not bruise/bleed easily.  Psychiatric/Behavioral: Negative for agitation and behavioral problems.     Objective: Vitals:   09/21/15 0923  BP: 117/77  Pulse: 91  Resp: 20  Temp: 98 F (36.7 C)  TempSrc: Oral  SpO2: 99%  Weight: 134 lb (60.8 kg)  Height: 5\' 2"  (1.575 m)      Physical Exam  Constitutional: She is oriented to person, place, and time. She appears well-developed and well-nourished.  Cardiovascular: Normal heart sounds and intact distal pulses.   No murmur heard. Pulmonary/Chest: Effort normal and breath sounds normal. She has no wheezes. She has no rales. She exhibits no tenderness.  Abdominal: Soft. Bowel sounds are normal. She exhibits no distension and no mass. There is no tenderness.  Musculoskeletal: She exhibits tenderness (Lumbar spine tenderness and positive straight leg raise on the left).  Fingers and toes with evidence of bony rheumatoid deformities  Neurological: She is alert and oriented to person, place, and time.       Assessment & Plan:  1. Degenerative disc disease, lumbar Continue Flexeril and Tylenol No. 3  2. Spinal stenosis of lumbar region No  red flags at this time Referral to pain management and neurosurgery pending - advised the patient to apply for the [facility] discount to facilitate referral process  3. Rheumatoid arthritis involving multiple sites, unspecified rheumatoid factor presence (HCC) Continue prednisone-side effects of prednisone discussed Would love her to be on immunomodulatory agent however this will need rheumatology referral and the [place] discount will helped with this  referral.  4. High-risk medication use Screen for diabetes mellitus given history of high-dose steroid use. A1c is normal

## 2015-09-21 NOTE — Progress Notes (Signed)
"  Hurting real bad, hands wrist, knee, "everywhere" multiple joint pain".  Refill medications on all.

## 2015-09-28 ENCOUNTER — Telehealth: Payer: Self-pay

## 2015-09-28 NOTE — Telephone Encounter (Signed)
-----   Message from [name], MD sent at 09/22/2015  2:04 PM EDT ----- Please inform the patient that labs are normal. Thank you.

## 2015-09-28 NOTE — Telephone Encounter (Signed)
Writer called patient regarding lab results per Dr. Jarold Song. Patient stated understanding.  Patient c/o continued back pain and wanted to schedule an appt with MD.  Appt scheduled for 10/06/15.

## 2015-10-06 ENCOUNTER — Ambulatory Visit: Payer: Medicaid Other | Admitting: Family Medicine

## 2015-10-11 ENCOUNTER — Telehealth: Payer: Self-pay | Admitting: Family Medicine

## 2015-10-11 NOTE — Telephone Encounter (Signed)
Patient called the office to request refill for cyclobenzaprine (FLEXERIL) 5 MG tablet. Pt cannot wait until her appt 10/4 to get medication. Please follow up.   Thank you

## 2015-10-12 MED ORDER — CYCLOBENZAPRINE HCL 5 MG PO TABS: 5.0000 mg | ORAL_TABLET | Freq: Three times a day (TID) | ORAL | 0 refills | Status: DC | PRN

## 2015-10-12 NOTE — Telephone Encounter (Signed)
Patient phone not working.  Spoke with daughter, she will let patient know the flexeril is at [facility] pharmacy.

## 2015-10-12 NOTE — Telephone Encounter (Signed)
Done

## 2015-10-19 ENCOUNTER — Ambulatory Visit: Payer: Medicaid Other | Admitting: Family Medicine

## 2015-10-20 ENCOUNTER — Ambulatory Visit: Payer: Self-pay | Attending: Family Medicine | Admitting: Family Medicine

## 2015-10-20 ENCOUNTER — Encounter: Payer: Self-pay | Admitting: Family Medicine

## 2015-10-20 VITALS — BP 127/72 | HR 109 | Temp 98.6°F | Ht 62.0 in | Wt 135.4 lb

## 2015-10-20 DIAGNOSIS — Z23 Encounter for immunization: Secondary | ICD-10-CM

## 2015-10-20 DIAGNOSIS — W57XXXA Bitten or stung by nonvenomous insect and other nonvenomous arthropods, initial encounter: Secondary | ICD-10-CM | POA: Insufficient documentation

## 2015-10-20 DIAGNOSIS — K029 Dental caries, unspecified: Secondary | ICD-10-CM | POA: Insufficient documentation

## 2015-10-20 DIAGNOSIS — M069 Rheumatoid arthritis, unspecified: Secondary | ICD-10-CM | POA: Insufficient documentation

## 2015-10-20 DIAGNOSIS — M7989 Other specified soft tissue disorders: Secondary | ICD-10-CM | POA: Insufficient documentation

## 2015-10-20 DIAGNOSIS — L84 Corns and callosities: Secondary | ICD-10-CM | POA: Insufficient documentation

## 2015-10-20 DIAGNOSIS — Z79899 Other long term (current) drug therapy: Secondary | ICD-10-CM | POA: Insufficient documentation

## 2015-10-20 MED ORDER — ACETAMINOPHEN-CODEINE #3 300-30 MG PO TABS: 1.0000 | ORAL_TABLET | Freq: Two times a day (BID) | ORAL | 1 refills | Status: DC | PRN

## 2015-10-20 MED ORDER — DOXYCYCLINE HYCLATE 100 MG PO TABS: 100.0000 mg | ORAL_TABLET | Freq: Two times a day (BID) | ORAL | 0 refills | Status: DC

## 2015-10-20 MED ORDER — CYCLOBENZAPRINE HCL 5 MG PO TABS: 5.0000 mg | ORAL_TABLET | Freq: Two times a day (BID) | ORAL | 2 refills | Status: DC | PRN

## 2015-10-20 NOTE — Progress Notes (Signed)
Medication refills

## 2015-10-20 NOTE — Progress Notes (Signed)
Subjective:  Patient ID: Amy Hall, female    DOB: [birth year]  Age: 50 y.o. MRN: [id number]  CC: Foot Pain (left foot- fell 4 days ago); Arm Pain (right forearm); and Facial Swelling   HPI Amy Hall She is a 50 year old female with a history of rheumatoid arthritis, spinal stenosis who has not been able to see a rheumatologist due to lack of insurance and remains on chronic prednisone;  She comes into the clinic today for a refill of medications  Her MRI of the lumbar spine revealed severe degenerative disc disease in L3-L4 and severe spinal stenosis. Her low back pain shoots down her left butt cheek and down the back of her left thigh to her leg and is worse with change in position. She was referred to neurosurgery and pain management by the PA. She denies loss of sphincteric function or foot drop and ambulates with the aid of a cane. Takes Flexeril, Tylenol No. 3 which helped ease the symptoms and she remains on chronic prednisone.  She complains of a painful nodule on the sole of her left foot for the last few weeks which is worse when she bears weight on that foot. Also had an insect bite yesterday on her right arm with associated swelling, she has erythema of her cheeks and associated mild swelling; unsure of what bit her. Denies being outside in the woods but states "she does not feel well"and denies fever. Would like referral to a dentist as her dental filling has fallen out  Past Medical History:  Diagnosis Date  . Arthritis     Past Surgical History:  Procedure Laterality Date  . ELBOW SURGERY      No Known Allergies   Outpatient Medications Prior to Visit  Medication Sig Dispense Refill  . ibuprofen (ADVIL,MOTRIN) 200 MG tablet Take 800 mg by mouth every 6 (six) hours as needed (swelling).    . predniSONE (DELTASONE) 10 MG tablet Take 1 tablet (10 mg total) by mouth 2 (two) times daily with a meal. 60 tablet 1  . acetaminophen-codeine (TYLENOL #3) 300-30 MG  tablet Take 1 tablet by mouth every 12 (twelve) hours as needed for moderate pain. 60 tablet 1  . cyclobenzaprine (FLEXERIL) 5 MG tablet Take 1 tablet (5 mg total) by mouth 3 (three) times daily as needed for muscle spasms. 30 tablet 0  . Melatonin 5 MG TABS Take 5 mg by mouth Nightly. 2 tabs nightly    . potassium chloride (KLOR-CON 10) 10 MEQ tablet Take 1 tablet (10 mEq total) by mouth daily. (Patient not taking: Reported on 10/20/2015) 30 tablet 1   No facility-administered medications prior to visit.     ROS Review of Systems Constitutional: Negative for activity change, appetite change and fatigue.  HENT: Negative for congestion, sinus pressure and sore throat.   Eyes: Negative for visual disturbance.  Respiratory: Negative for cough, chest tightness, shortness of breath and wheezing.   Cardiovascular: Negative for chest pain and palpitations.  Gastrointestinal: Negative for abdominal distention, abdominal pain and constipation.  Endocrine: Negative for polydipsia.  Genitourinary: Negative for dysuria and frequency.  Musculoskeletal:       See hpi  Skin: positive for rash.  Neurological: Negative for tremors, light-headedness and numbness.  Hematological: Does not bruise/bleed easily.  Psychiatric/Behavioral: Negative for agitation and behavioral problems  Objective:  BP 127/72 (BP Location: Right Arm, Patient Position: Sitting, Cuff Size: Large)   Pulse (!) 109   Temp 98.6 F (37 C) (Oral)  Ht 5\' 2"  (1.575 m)   Wt 135 lb 6.4 oz (61.4 kg)   LMP 01/16/2015   SpO2 99%   BMI 24.76 kg/m   BP/Weight 10/20/2015 09/21/2015 [id number]  Systolic BP [id number]  Diastolic BP 72 77 79  Wt. (Lbs) 135.4 134 129.4  BMI 24.76 24.51 23.67      Physical Exam Constitutional: She is oriented to person, place, and time. She appears well-developed and well-nourished.  Cardiovascular: Normal heart sounds and intact distal pulses.   No murmur heard. Pulmonary/Chest: Effort normal and  breath sounds normal. She has no wheezes. She has no rales. She exhibits no tenderness.  Abdominal: Soft. Bowel sounds are normal. She exhibits no distension and no mass. There is no tenderness.  Musculoskeletal: She exhibits tenderness (Lumbar spine tenderness and positive straight leg raise on the left).  Fingers and toes with evidence of bony rheumatoid deformities  Left foot with tender nodule in the middle of sole Right hand with induration on forearm which is nontender  Skin: slight erythema of right cheek and mild induraion Neurological: She is alert and oriented to person, place, and time.    Assessment & Plan:   1. Rheumatoid arthritis involving multiple sites, unspecified rheumatoid factor presence ([place]) Awaiting approval for [employer] so rheumatology referral can be placed - acetaminophen-codeine (TYLENOL #3) 300-30 MG tablet; Take 1 tablet by mouth every 12 (twelve) hours as needed for moderate pain.  Dispense: 60 tablet; Refill: 1 - cyclobenzaprine (FLEXERIL) 5 MG tablet; Take 1 tablet (5 mg total) by mouth 2 (two) times daily as needed for muscle spasms.  Dispense: 60 tablet; Refill: 2  2. Caries involving multiple surfaces of tooth - Ambulatory referral to Dentistry  3. Callus of foot - Ambulatory referral to Podiatry  4. Arthropod bite, initial encounter Treating presumptively fatigued bite given she is immunocompressed - doxycycline (VIBRA-TABS) 100 MG tablet; Take 1 tablet (100 mg total) by mouth 2 (two) times daily.  Dispense: 20 tablet; Refill: 0  5. Encounter for immunization - Flu Vaccine QUAD 36+ mos IM   Meds ordered this encounter  Medications  . acetaminophen-codeine (TYLENOL #3) 300-30 MG tablet    Sig: Take 1 tablet by mouth every 12 (twelve) hours as needed for moderate pain.    Dispense:  60 tablet    Refill:  1  . cyclobenzaprine (FLEXERIL) 5 MG tablet    Sig: Take 1 tablet (5 mg total) by mouth 2 (two) times daily as needed for muscle  spasms.    Dispense:  60 tablet    Refill:  2  . doxycycline (VIBRA-TABS) 100 MG tablet    Sig: Take 1 tablet (100 mg total) by mouth 2 (two) times daily.    Dispense:  20 tablet    Refill:  0    Follow-up: Return in about 2 months (around 12/20/2015) for Follow-up in rheumatoid.   [name] MD

## 2015-11-04 ENCOUNTER — Ambulatory Visit (HOSPITAL_BASED_OUTPATIENT_CLINIC_OR_DEPARTMENT_OTHER): Payer: Self-pay | Admitting: Physical Medicine & Rehabilitation

## 2015-11-04 ENCOUNTER — Encounter: Payer: Self-pay | Admitting: Physical Medicine & Rehabilitation

## 2015-11-04 ENCOUNTER — Encounter: Payer: Self-pay | Attending: Physical Medicine & Rehabilitation

## 2015-11-04 VITALS — BP 117/83 | HR 99 | Temp 98.6°F | Resp 14

## 2015-11-04 DIAGNOSIS — M48062 Spinal stenosis, lumbar region with neurogenic claudication: Secondary | ICD-10-CM

## 2015-11-04 DIAGNOSIS — M79605 Pain in left leg: Secondary | ICD-10-CM | POA: Insufficient documentation

## 2015-11-04 DIAGNOSIS — M069 Rheumatoid arthritis, unspecified: Secondary | ICD-10-CM

## 2015-11-04 DIAGNOSIS — Z5181 Encounter for therapeutic drug level monitoring: Secondary | ICD-10-CM

## 2015-11-04 DIAGNOSIS — M545 Low back pain: Secondary | ICD-10-CM | POA: Insufficient documentation

## 2015-11-04 NOTE — Patient Instructions (Addendum)
We'll do a lumbar spine injection L3-L4 epidural. will need a driver. If the injection is partially helpful, but wears off. We may repeat it once or twice. If it is not helpful, would consider surgical evaluation.  Opioid risk is 9. Would not prescribe any scheduled 2 or 3 narcotic analgesics

## 2015-11-04 NOTE — Progress Notes (Signed)
Subjective:    Patient ID: Amy Hall, female    DOB: 01-Nov-1965, 50 y.o.   MRN: [id number]  HPI  Chief complaint is low back pain radiating into the left lower extremity   50 year old female with history of rheumatoid arthritis that was diagnosed approximate 6 years ago was followed by Dr. Dossie Der in [place] , who treated the patient with low-dose prednisone. There were recommendations for methotrexate as well as other medications. However, could not do these secondary to insurance coverage. Plaquenil was tried in the past but could not tolerate side effects.  Patient moved [place] about 6 or 7 months ago. Patient fell in March or April of this year and started developing back pain and left lower extremity pain. Pain initially was very severe and cause problems with sitting.  Patient complains of left foot pain with standing, although she does have some pain when she is sitting or laying down. Patient complains of numbness and tingling in the left calf area. Patient also complains of weakness, although difficult to tell exactly where it is. Perhaps in the ankle area.  Patient's constipation, but no other bowel or bladder issues.  Primary care physician prescribed cyclobenzaprine, which was partially helpful. MRI of the lumbar spine was performed in August 2017 and demonstrated L3-L4 lumbar spinal stenosis with both lateral recess stenosis, as well as left foraminal stenosis.  Tried gabapentin. She felt weak from this. This was started by primary physician on 05/31/2015. Dr. Nolene Ebbs in [place]    Pain Inventory Average Pain 10 Pain Right Now 10 My pain is sharp, stabbing, tingling and aching  In the last 24 hours, has pain interfered with the following? General activity 10 Relation with others 10 Enjoyment of life 10 What TIME of day is your pain at its worst? morning, night Sleep (in general) Poor  Pain is worse with: walking, bending, sitting, standing  and some activites Pain improves with: rest and therapy/exercise Relief from Meds: 3  Mobility walk with assistance use a cane how many minutes can you walk? "hurts to walk" ability to climb steps?  yes do you drive?  yes transfers alone Do you have any goals in this area?  yes  Function not employed: date last employed 02/2015 disabled: date disabled 02/2013 I need assistance with the following:  dressing, bathing, household duties, shopping and sometimes needs help   Neuro/Psych numbness tingling trouble walking spasms confusion depression anxiety  Prior Studies x-rays CT/MRI EXAM: MRI LUMBAR SPINE WITHOUT CONTRAST  TECHNIQUE: Multiplanar, multisequence MR imaging of the lumbar spine was performed. No intravenous contrast was administered.  COMPARISON:  Lumbar spine radiographs 07/07/2015  FINDINGS: Segmentation:  Standard.  Alignment: Slight lumbar dextroscoliosis as previously seen. No significant listhesis.  Vertebrae: Chronic L1 superior endplate compression fracture with 40% height loss anteriorly and no residual marrow edema or retropulsion. There is severe disc space narrowing at L3-4 with moderate type 1 endplate changes asymmetric to the left. Small L4 superior endplate Schmorl's node. Disc desiccation throughout the lumbar spine.  Conus medullaris: Extends to the L1 level and appears normal.  Paraspinal and other soft tissues: Unremarkable.  Disc levels:  L1-2:  Mild disc bulging without stenosis.  L2-3:  Minimal disc bulging without stenosis.  L3-4: Circumferential disc bulging, moderate-sized left paracentral disc extrusion with caudal migration to the mid L3 vertebral body level, left greater than right ligamentum flavum thickening, and moderate facet arthrosis result in severe spinal stenosis, moderate left greater than right lateral recess stenosis, and  severe left neural foraminal stenosis. The L4 nerve roots may be  affected in the lateral recesses and the left L3 nerve may be affected in the neural foramen.  L4-5: Disc bulging asymmetric to the right, slight ligamentum flavum thickening, and mild-to-moderate facet arthrosis result in mild spinal stenosis, mild bilateral lateral recess stenosis, and mild to moderate right neural foraminal stenosis.  L5-S1: Minimal disc bulging and mild facet arthrosis without stenosis.  IMPRESSION: 1. Advanced disc degeneration at L3-4 with severe spinal stenosis in severe left neural foraminal stenosis. 2. Mild spinal stenosis and mild-to-moderate right foraminal stenosis at L4-5.   Electronically Signed   By: Logan Bores M.D. Physicians involved in your care Primary care [facility]   No family history on file. Social History   Social History  . Marital status: Divorced    Spouse name: N/A  . Number of children: N/A  . Years of education: N/A   Social History Main Topics  . Smoking status: Current Every Day Smoker    Packs/day: 1.00    Types: Cigarettes  . Smokeless tobacco: Never Used  . Alcohol use 0.6 - 1.2 oz/week    1 - 2 Cans of beer per week  . Drug use: No  . Sexual activity: Not Asked   Other Topics Concern  . None   Social History Narrative  . None   Past Surgical History:  Procedure Laterality Date  . ELBOW SURGERY     Past Medical History:  Diagnosis Date  . Arthritis    BP 117/83 (BP Location: Right Arm, Patient Position: Sitting, Cuff Size: Large)   Pulse 99   Temp 98.6 F (37 C) (Oral)   Resp 14   LMP 01/16/2015   SpO2 94%   Opioid Risk Score:   Fall Risk Score:  `1  Depression screen PHQ 2/9  Depression screen [facility] 2/9 11/04/2015 10/20/2015 09/21/2015 09/02/2015 08/18/2015  Decreased Interest 2 2 1  0 1  Down, Depressed, Hopeless 3 1 1 2 2   PHQ - 2 Score 5 3 2 2 3   Altered sleeping 3 3 2  - 2  Tired, decreased energy 1 1 3  - 2  Change in appetite 2 2 2  - 1  Feeling bad or failure about  yourself  3 2 2  - 2  Trouble concentrating 3 3 2  - 1  Moving slowly or fidgety/restless 2 3 1  - 1  Suicidal thoughts 1 1 1  - 1  PHQ-9 Score 20 18 15  - 13  Difficult doing work/chores Very difficult - - - -     Review of Systems  Constitutional: Positive for unexpected weight change.       Night sweats   Respiratory: Positive for apnea, cough and wheezing.   Musculoskeletal:       Limb swelling  All other systems reviewed and are negative.      Objective:   Physical Exam        Assessment & Plan:

## 2015-11-04 NOTE — Progress Notes (Signed)
Please see prior note  Examination  General: No acute distress Mood and affect are appropriate Heart: Regular rate and rhythm no rubs murmurs or extra sounds Lungs: Clear to auscultation, breathing unlabored, no rales or wheezes Abdomen: Positive bowel sounds, soft nontender to palpation, nondistended Extremities: No clubbing, cyanosis, or edema Skin: No evidence of breakdown, no evidence of rash, no nodules Neurologic: Cranial nerves II through XII intact, motor strength is 5/5 in bilateral deltoid, bicep, tricep, grip, hip flexor, knee extensors, ankle dorsiflexor and plantar flexor Sensory exam normal sensation to light touch and proprioception in bilateral upper and lower extremities Cerebellar exam normal finger to nose to finger as well as heel to shin in bilateral upper and lower extremities Musculoskeletal: Hallux valgus, right foot. No evidence of swan-neck or boutonniere's deformities. No ulnar deviation of the hands. No evidence of effusion knees, ankles, feet, hands or wrists.   Negative straight leg raising Negative femoral stretch test  Neuro:  Eyes without evidence of nystagmus  Tone is normal without evidence of spasticity Cerebellar exam shows no evidence of ataxia on finger nose finger or heel to shin testing No evidence of trunkal ataxia  Deep tendon reflexes are 3 plus bilateral biceps, triceps, brachial radialis, patellar, 1 at bilateral Achilles  Sensory exam is normal to pinprick, proprioception and light touch in the upper and lower limbs   Impression: 1. Lumbar spinal stenosis combination of ligamentum flavum hypertrophy, facet arthropathy as well as disc protrusion, L3-4, has both lateral recess narrowing as well as left foraminal narrowing. She does have left lower extremity radicular discomfort. She has tried gabapentin but did not tolerate it because of sedation. She is already on low dose prednisone. Also on anti-inflammatory medications.  We discussed  treatment options. Opioid risk is 9. Therefore risks stronger narcotics outweigh benefit Will schedule for epidural injection. L3-L4 translaminar If this provides a temporary relief. It can be repeated once or twice. If it is not helpful, would proceed with neurosurgical consultation, as already ordered by PCP.  2. Rheumatoid arthritis on low-dose prednisone, no significant joint deformities at this point except for hallux valgus of the right great toe which is likely OA. She does not have any neck pain but has some hyperactive reflexes. No other signs of myelopathy. However, if neck pain increases. Would recommend MRI of the cervical spine to assess for cervical stenosis

## 2015-11-07 ENCOUNTER — Telehealth: Payer: Self-pay | Admitting: Family Medicine

## 2015-11-07 NOTE — Telephone Encounter (Signed)
Pt calling to follow up on referrals to the Rheumatologist and Podiatrist  Pt has been approved for [employer] but does not qualify for the [employer]

## 2015-11-07 NOTE — Telephone Encounter (Signed)
Since she has CAFA now I sent the referral to [facility]  [phone]  [place] .They will contact the patient to schedule an appointment . I don't see a rheumatology referral

## 2015-11-11 LAB — 6-ACETYLMORPHINE,TOXASSURE ADD
6-ACETYLMORPHINE: NEGATIVE
6-ACETYLMORPHINE: NOT DETECTED ng/mg{creat}

## 2015-11-11 LAB — TOXASSURE SELECT,+ANTIDEPR,UR

## 2015-11-11 NOTE — Progress Notes (Signed)
Urine drug screen for this encounter is consistent for prescribed medication 

## 2015-11-23 ENCOUNTER — Encounter: Payer: Self-pay | Admitting: Family Medicine

## 2015-11-23 ENCOUNTER — Ambulatory Visit: Payer: Self-pay | Attending: Family Medicine | Admitting: Family Medicine

## 2015-11-23 DIAGNOSIS — M79672 Pain in left foot: Secondary | ICD-10-CM | POA: Insufficient documentation

## 2015-11-23 DIAGNOSIS — M48061 Spinal stenosis, lumbar region without neurogenic claudication: Secondary | ICD-10-CM | POA: Insufficient documentation

## 2015-11-23 DIAGNOSIS — Z7952 Long term (current) use of systemic steroids: Secondary | ICD-10-CM | POA: Insufficient documentation

## 2015-11-23 DIAGNOSIS — M5136 Other intervertebral disc degeneration, lumbar region: Secondary | ICD-10-CM | POA: Insufficient documentation

## 2015-11-23 DIAGNOSIS — M545 Low back pain: Secondary | ICD-10-CM | POA: Insufficient documentation

## 2015-11-23 DIAGNOSIS — M069 Rheumatoid arthritis, unspecified: Secondary | ICD-10-CM | POA: Insufficient documentation

## 2015-11-23 DIAGNOSIS — M79671 Pain in right foot: Secondary | ICD-10-CM | POA: Insufficient documentation

## 2015-11-23 MED ORDER — PREDNISONE 10 MG PO TABS: 10.0000 mg | ORAL_TABLET | Freq: Two times a day (BID) | ORAL | 1 refills | Status: DC

## 2015-11-23 NOTE — Patient Instructions (Signed)
Rheumatoid Arthritis  Rheumatoid arthritis is a long-term (chronic) inflammatory disease that causes pain, swelling, and stiffness of the joints. It can affect the entire body, including the eyes and lungs. The effects of rheumatoid arthritis vary widely among those with the condition.  CAUSES  The cause of rheumatoid arthritis is not known. It tends to run in families and is more common in women. Certain cells of the body's natural defense system (immune system) do not work properly and begin to attack healthy joints. It primarily involves the connective tissue that lines the joints (synovial membrane). This can cause damage to the joint.  SYMPTOMS  · Pain, stiffness, swelling, and decreased motion of many joints, especially in the hands and feet.  · Stiffness that is worse in the morning. It may last 1-2 hours or longer.  · Numbness and tingling in the hands.  · Fatigue.  · Loss of appetite.  · Weight loss.  · Low-grade fever.  · Dry eyes and mouth.  · Firm lumps (rheumatoid nodules) that grow beneath the skin in areas such as the elbows and hands.  DIAGNOSIS  Diagnosis is based on the symptoms described, an exam, and blood tests. Sometimes, X-rays are helpful.  TREATMENT  The goals of treatment are to relieve pain, reduce inflammation, and to slow down or stop joint damage and disability. Methods vary and may include:  · Maintaining a balance of rest, exercise, and proper nutrition.  · Your health care provider may adjust your medicines every 3 months until treatment goals are reached. Common medicines include:    Pain relievers (analgesics).    Corticosteroids and nonsteroidal anti-inflammatory drugs (NSAIDs) to reduce inflammation.    Disease-modifying antirheumatic drugs (DMARDs) to try to slow the course of the disease.    Biologic response modifiers to reduce inflammation and damage.  · Physical therapy and occupational therapy.  · Surgery for patients with severe joint damage. Joint replacement or fusing of  joints may be needed.  · Routine monitoring and ongoing care, such as office visits, blood and urine tests, and X-rays.  Your health care provider will work with you to identify the best treatment option for you, based on an assessment of the overall disease activity in your body.  HOME CARE INSTRUCTIONS  · Remain physically active and reduce activity when the disease gets worse.  · Eat a well-balanced diet.  · Put heat on affected joints when you wake up and before activities. Keep the heat on the affected joint for as long as directed by your health care provider.  · Put ice on affected joints following activities or exercising.    Put ice in a plastic bag.    Place a towel between your skin and the bag.    Leave the ice on for 15-20 minutes, 3-4 times per day, or as directed by your health care provider.  · Take medicines and supplements only as directed by your health care provider.  · Use splints as directed by your health care provider. Splints help maintain joint position and function.  · Do not sleep with pillows under your knees. This may lead to spasms.  · Participate in a self-management program to keep current with the latest treatment and coping skills.  SEEK IMMEDIATE MEDICAL CARE IF:  · You have fainting episodes.  · You have periods of extreme weakness.  · You rapidly develop a hot, painful joint that is more severe than usual joint aches.  · You have chills.  ·   You have a fever.  FOR MORE INFORMATION  · American College of Rheumatology: www.rheumatology.org  · Arthritis Foundation: www.arthritis.org     This information is not intended to replace advice given to you by your health care provider. Make sure you discuss any questions you have with your health care provider.     Document Released: 12/30/1999 Document Revised: 01/22/2014 Document Reviewed: 02/07/2011  Elsevier Interactive Patient Education ©2016 [employer]vier [employer]

## 2015-11-23 NOTE — Progress Notes (Signed)
Subjective:  Patient ID: Amy Hall, female    DOB: 05-07-1965  Age: 50 y.o. MRN: [id number]  CC: Follow-up (back pain); feet pain (L>R  pain a couple of weeks); and Rheumatoid Arthritis   HPI Amy Hall is a 50 year old female with a history of rheumatoid arthritis, spinal stenosis who has not been able to see a rheumatologist due to lack of insurance and remains on chronic prednisone;  She comes into the clinic today for a refill of medications and referral to Rheumatology as her [employer] Application has been approved  Her MRI of the lumbar spine revealed severe degenerative disc disease in L3-L4 and severe spinal stenosis. Her low back pain shoots down her left butt cheek and down the back of her left thigh to her leg and is worse with change in position. She is scheduled to receive to receive epidural spinal injections tomorrow. She denies loss of sphincteric function or foot drop and ambulates with the aid of a cane. Takes Flexeril, Tylenol No. 3 which helps ease the symptoms and she remains on chronic prednisone.  Past Medical History:  Diagnosis Date  . Arthritis     Past Surgical History:  Procedure Laterality Date  . ELBOW SURGERY      Allergies  Allergen Reactions  . Gabapentin      Outpatient Medications Prior to Visit  Medication Sig Dispense Refill  . acetaminophen-codeine (TYLENOL #3) 300-30 MG tablet Take 1 tablet by mouth every 12 (twelve) hours as needed for moderate pain. 60 tablet 1  . cyclobenzaprine (FLEXERIL) 5 MG tablet Take 1 tablet (5 mg total) by mouth 2 (two) times daily as needed for muscle spasms. 60 tablet 2  . ibuprofen (ADVIL,MOTRIN) 200 MG tablet Take 800 mg by mouth every 6 (six) hours as needed (swelling).    . predniSONE (DELTASONE) 10 MG tablet Take 1 tablet (10 mg total) by mouth 2 (two) times daily with a meal. 60 tablet 1  . Melatonin 5 MG TABS Take 5 mg by mouth Nightly. 2 tabs nightly    . doxycycline (VIBRA-TABS) 100 MG  tablet Take 1 tablet (100 mg total) by mouth 2 (two) times daily. 20 tablet 0  . potassium chloride (KLOR-CON 10) 10 MEQ tablet Take 1 tablet (10 mEq total) by mouth daily. (Patient not taking: Reported on 11/23/2015) 30 tablet 1   No facility-administered medications prior to visit.     ROS Review of Systems Constitutional: Negative for activity change, appetite change and fatigue.  HENT: Negative for congestion, sinus pressure and sore throat.   Eyes: Negative for visual disturbance.  Respiratory: Negative for cough, chest tightness, shortness of breath and wheezing.   Cardiovascular: Negative for chest pain and palpitations.  Gastrointestinal: Negative for abdominal distention, abdominal pain and constipation.  Endocrine: Negative for polydipsia.  Genitourinary: Negative for dysuria and frequency.  Musculoskeletal:       See hpi  Skin: positive for rash.  Neurological: Negative for tremors, light-headedness and numbness.  Hematological: Does not bruise/bleed easily.  Psychiatric/Behavioral: Negative for agitation and behavioral problems Objective:  BP (!) 142/74 (BP Location: Right Arm, Patient Position: Sitting, Cuff Size: Small)   Pulse 88   Temp 98.1 F (36.7 C) (Oral)   Ht 5\' 3"  (1.6 m)   Wt 138 lb 12.8 oz (63 kg)   LMP 01/16/2015   SpO2 100%   BMI 24.59 kg/m   BP/Weight 11/23/2015 11/04/2015 [id number]  Systolic BP [id number]  Diastolic BP 74 83 72  Wt. (Lbs) 138.8 - 135.4  BMI 24.59 - 24.76      Physical Exam Constitutional: She is oriented to person, place, and time. She appears well-developed and well-nourished.  Cardiovascular: Normal heart sounds and intact distal pulses.   No murmur heard. Pulmonary/Chest: Effort normal and breath sounds normal. She has no wheezes. She has no rales. She exhibits no tenderness.  Abdominal: Soft. Bowel sounds are normal. She exhibits no distension and no mass. There is no tenderness.  Musculoskeletal: She exhibits tenderness  (Lumbar spine tenderness and positive straight leg raise on the left).  Fingers and toes with evidence of bony rheumatoid deformities  Left foot with tender nodule in the middle of sole Right hand with induration on forearm which is nontender  Skin: slight erythema of right cheek and mild induraion Neurological: She is alert and oriented to person, place, and time.  Assessment & Plan:   1. Rheumatoid arthritis involving multiple sites, unspecified rheumatoid factor presence ([facility]) - predniSONE (DELTASONE) 10 MG tablet; Take 1 tablet (10 mg total) by mouth 2 (two) times daily with a meal.  Dispense: 60 tablet; Refill: 1 - Ambulatory referral to Rheumatology   Meds ordered this encounter  Medications  . predniSONE (DELTASONE) 10 MG tablet    Sig: Take 1 tablet (10 mg total) by mouth 2 (two) times daily with a meal.    Dispense:  60 tablet    Refill:  1    Follow-up: Return in about 1 month (around 12/23/2015) for Complete physical exam.   [name] MD

## 2015-11-23 NOTE — Progress Notes (Signed)
Seeing podiatry on the 20th Needs referral to rheumatology Getting injections in back tomorrow with pain clinic

## 2015-11-24 ENCOUNTER — Ambulatory Visit (HOSPITAL_BASED_OUTPATIENT_CLINIC_OR_DEPARTMENT_OTHER): Payer: Self-pay | Admitting: Physical Medicine & Rehabilitation

## 2015-11-24 ENCOUNTER — Encounter: Payer: Self-pay | Attending: Physical Medicine & Rehabilitation

## 2015-11-24 DIAGNOSIS — M79605 Pain in left leg: Secondary | ICD-10-CM | POA: Insufficient documentation

## 2015-11-24 DIAGNOSIS — M545 Low back pain: Secondary | ICD-10-CM | POA: Insufficient documentation

## 2015-11-24 DIAGNOSIS — M5416 Radiculopathy, lumbar region: Secondary | ICD-10-CM | POA: Insufficient documentation

## 2015-11-24 NOTE — Patient Instructions (Signed)

## 2015-11-24 NOTE — Progress Notes (Signed)
  PROCEDURE RECORD [facility]   Name: Amy Hall DOB:1965/07/01 MRN: [id number]  Date:11/24/2015  Physician: Alysia Penna, MD    Nurse/CMA: Yolanda Bonine  Allergies:  Allergies  Allergen Reactions  . Gabapentin     Consent Signed: Yes.    Is patient diabetic? No.  CBG today?   Pregnant: No. LMP: Patient's last menstrual period was 01/16/2015. (age 50-55)  Anticoagulants: no Anti-inflammatory: no Antibiotics: no  Procedure: Lumbar 3-4 Translaminar Position: Prone Start Time: 1320 End Time: 1325  Fluoro Time: 12  RN/CMA Janet Berlin    Time 1305 1330    BP 117/85 107/76    Pulse 104 99    Respirations 14 14    O2 Sat 93 97    S/S 6 6    Pain Level 8 5     D/C home with Joneen Boers, patient A & O X 3, D/C instructions reviewed, and sits independently.

## 2015-11-24 NOTE — Progress Notes (Signed)
Left L3-4 paramedian Lumbar epidural steroid injection under fluoroscopic guidance  Indication: Lumbosacral radiculitis is not relieved by medication management or other conservative care and interfering with self-care and mobility.  No  anticoagulant use.  Informed consent was obtained after describing risk and benefits of the procedure with the patient, this includes bleeding, bruising, infection, paralysis and medication side effects.  The patient wishes to proceed and has given written consent.  Patient was placed in a prone position.  The lumbar area was marked and prepped with Betadine.  It was entered with a 25-gauge 1-1/2 inch needle and one mL of 1% lidocaine was injected into the skin and subcutaneous tissue.  Then a 17-gauge spinal needle was inserted under fluoroscopic guidance into the L3-4 interlaminar space under AP and Lateral imaging.  Once needle tip of approximated the posterior elements, a loss of resistance technique was utilized with lateral imaging.  A positive loss of resistance was obtained and then confirmed by injecting 2 mL's of Omnipaque 180.  Then a solution containing 1.5 mL's of 6mg /ml Celestone and 1.5 mL's of 1% lidocaine was injected.  The patient tolerated procedure well.  Post procedure instructions were given.  Please see post procedure form.

## 2015-12-05 ENCOUNTER — Encounter: Payer: Self-pay | Admitting: Podiatry

## 2015-12-05 ENCOUNTER — Ambulatory Visit: Payer: No Typology Code available for payment source

## 2015-12-05 ENCOUNTER — Ambulatory Visit: Payer: No Typology Code available for payment source | Admitting: Podiatry

## 2015-12-05 DIAGNOSIS — M2041 Other hammer toe(s) (acquired), right foot: Secondary | ICD-10-CM

## 2015-12-05 DIAGNOSIS — S92335A Nondisplaced fracture of third metatarsal bone, left foot, initial encounter for closed fracture: Secondary | ICD-10-CM

## 2015-12-05 DIAGNOSIS — M2042 Other hammer toe(s) (acquired), left foot: Secondary | ICD-10-CM

## 2015-12-05 DIAGNOSIS — M2010 Hallux valgus (acquired), unspecified foot: Secondary | ICD-10-CM

## 2015-12-05 DIAGNOSIS — R52 Pain, unspecified: Secondary | ICD-10-CM

## 2015-12-05 NOTE — Progress Notes (Signed)
   Subjective:    Patient ID: Amy Hall, female    DOB: 04-25-1965, 50 y.o.   MRN: [id number]  HPI  50 year old female presents the office they for concerns of painful bunions to both of her feet. She has a history of rheumatoid arthritis. She states this been hurting for several years and she's had no treatment. Most notably of the last couple weeks she has had increasing pain to her left foot. She has noticed some swelling to the left foot which has gotten somewhat better. Denies any redness or warmth. She denies any recent injury or trauma. No other complaints at this time.   Review of Systems  All other systems reviewed and are negative.      Objective:   Physical Exam General: AAO x3, NAD  Dermatological: Skin is warm, dry and supple bilateral. Nails x 10 are well manicured; remaining integument appears unremarkable at this time. There are no open sores, no preulcerative lesions, no rash or signs of infection present.  Vascular: Dorsalis Pedis artery and Posterior Tibial artery pedal pulses are 2/4 bilateral with immedate capillary fill time. Pedal hair growth present.  There is no pain with calf compression, swelling, warmth, erythema.   Neruologic: Grossly intact via light touch bilateral. Vibratory intact via tuning fork bilateral. Protective threshold with Semmes Wienstein monofilament intact to all pedal sites bilateral.   Musculoskeletal: There is significant bunion deformity present bilaterally with irritation on the medial aspect of the first metatarsal head from shoe gear. There is decreased range of motion the first metatarsal phalangeal joint. 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 There is flexion contractures of the digits. On the left side there is tenderness directly on the third and fourth metatarsal and there is overlying edema without any erythema or increase in warmth. There is no other areas of tenderness. MMT 5/5. Decrease in medial  arch.  Gait: Unassisted, Nonantalgic.     Assessment & Plan:  50 year old female symptomatic HAV/hammertoes, metatarsal fracture left foot -Treatment options discussed including all alternatives, risks, and complications -Etiology of symptoms were discussed -X-rays were obtained and reviewed with the patient. Significant HAV is present bilaterally. 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 On the left foot there is a subacute fracture of the third metatarsal. No other areas of acute fracture identified. -Surgical shoe for left foot dispensed today. Ice and elevation. Limited activity. -Accommodate type inserts were dispensed to her today to help support her foot as well as tube foam for offloading of the bunions. -Discussed shoe gear modifications -Follow up in 4 weeks or sooner if needed. Call any questions or concerns meantime.  *x-ray left foot next appointment  Celesta Gentile, DPM

## 2015-12-22 ENCOUNTER — Ambulatory Visit: Payer: Medicaid Other | Admitting: Physical Medicine & Rehabilitation

## 2015-12-23 ENCOUNTER — Encounter: Payer: Medicaid Other | Admitting: Family Medicine

## 2016-01-02 ENCOUNTER — Ambulatory Visit: Payer: No Typology Code available for payment source | Admitting: Podiatry

## 2016-01-02 ENCOUNTER — Ambulatory Visit: Payer: No Typology Code available for payment source

## 2016-01-02 ENCOUNTER — Encounter: Payer: Self-pay | Admitting: Podiatry

## 2016-01-02 DIAGNOSIS — M2041 Other hammer toe(s) (acquired), right foot: Secondary | ICD-10-CM

## 2016-01-02 DIAGNOSIS — S92335A Nondisplaced fracture of third metatarsal bone, left foot, initial encounter for closed fracture: Secondary | ICD-10-CM

## 2016-01-02 DIAGNOSIS — G629 Polyneuropathy, unspecified: Secondary | ICD-10-CM

## 2016-01-02 DIAGNOSIS — M79671 Pain in right foot: Secondary | ICD-10-CM

## 2016-01-02 DIAGNOSIS — M2042 Other hammer toe(s) (acquired), left foot: Secondary | ICD-10-CM

## 2016-01-02 DIAGNOSIS — M2010 Hallux valgus (acquired), unspecified foot: Secondary | ICD-10-CM

## 2016-01-02 MED ORDER — PREGABALIN 75 MG PO CAPS: 75.0000 mg | ORAL_CAPSULE | Freq: Two times a day (BID) | ORAL | 0 refills | Status: DC

## 2016-01-02 NOTE — Progress Notes (Signed)
Subjective: 50 year old female presents the office if a follow-up elevation of bilateral foot pain. She's of the left foot is no better with the right side is continuing to hurt. She is been wearing the surgical shoe intermittently in the left foot does not wear today. She has not been wearing the insert for the right foot that we give her last appointment. She denies any increase in swelling or redness or warmth or feet. No recent injury or trauma. Also states that she still he numbness and tingling to her feet. She was on gabapentin previously for this. Denies any systemic complaints such as fevers, chills, nausea, vomiting. No acute changes since last appointment, and no other complaints at this time.   Objective: AAO x3, NAD DP/PT pulses palpable bilaterally, CRT less than 3 seconds Presents continue tenderness palpation to the left foot on the third metatarsal. There is also diffuse pain submetatarsal 1-5 on the left foot. Significant HAV is present and hammertoe contractures are present. On the right foot there is tenderness the same areas on the dorsal aspect of the metatarsals as well submetatarsal area. Significant HAV is again present with hammertoes. No other areas of tenderness.  No open lesions or pre-ulcerative lesions.  No pain with calf compression, swelling, warmth, erythema  Assessment: Left foot metatarsal 3 fracture ; significant HAV/hammertoes with chronic dislocation   Plan: -All treatment options discussed with the patient including all alternatives, risks, complications.  -X-rays were obtained and reviewed. The does may be some evidence of healing along the fracture the left foot metatarsal 3 fracture. No evidence of new fracture identified today. -Recommend continue with surgical shoe for the left foot. -Recommend her to start the instrument the right foot. She is going try to change her shoes as well purchase new shoes. -She is having numbness and tingling to her feedings  been ongoing for some time. She is present on gabapentin but cannot take the medicine. We'll try Lyrica. -Patient encouraged to call the office with any questions, concerns, change in symptoms.   Celesta Gentile, DPM

## 2016-01-02 NOTE — Patient Instructions (Signed)
Pregabalin capsules What is this medicine? PREGABALIN (pre GAB a lin) is used to treat nerve pain from diabetes, shingles, spinal cord injury, and fibromyalgia. It is also used to control seizures in epilepsy. This medicine may be used for other purposes; ask your health care provider or pharmacist if you have questions. COMMON BRAND NAME(S): Lyrica What should I tell my health care provider before I take this medicine? They need to know if you have any of these conditions: -bleeding problems -heart disease, including heart failure -history of alcohol or drug abuse -kidney disease -suicidal thoughts, plans, or attempt; a previous suicide attempt by you or a family member -an unusual or allergic reaction to pregabalin, gabapentin, other medicines, foods, dyes, or preservatives -pregnant or trying to get pregnant or trying to conceive with your partner -breast-feeding How should I use this medicine? Take this medicine by mouth with a glass of water. Follow the directions on the prescription label. You can take this medicine with or without food. Take your doses at regular intervals. Do not take your medicine more often than directed. Do not stop taking except on your doctor's advice. A special MedGuide will be given to you by the pharmacist with each prescription and refill. Be sure to read this information carefully each time. Talk to your pediatrician regarding the use of this medicine in children. Special care may be needed. Overdosage: If you think you have taken too much of this medicine contact a poison control center or emergency room at once. NOTE: This medicine is only for you. Do not share this medicine with others. What if I miss a dose? If you miss a dose, take it as soon as you can. If it is almost time for your next dose, take only that dose. Do not take double or extra doses. What may interact with this medicine? -alcohol -certain medicines for blood pressure like captopril,  enalapril, or lisinopril -certain medicines for diabetes, like pioglitazone or rosiglitazone -certain medicines for anxiety or sleep -narcotic medicines for pain This list may not describe all possible interactions. Give your health care provider a list of all the medicines, herbs, non-prescription drugs, or dietary supplements you use. Also tell them if you smoke, drink alcohol, or use illegal drugs. Some items may interact with your medicine. What should I watch for while using this medicine? Tell your doctor or healthcare professional if your symptoms do not start to get better or if they get worse. Visit your doctor or health care professional for regular checks on your progress. Do not stop taking except on your doctor's advice. You may develop a severe reaction. Your doctor will tell you how much medicine to take. Wear a medical identification bracelet or chain if you are taking this medicine for seizures, and carry a card that describes your disease and details of your medicine and dosage times. You may get drowsy or dizzy. Do not drive, use machinery, or do anything that needs mental alertness until you know how this medicine affects you. Do not stand or sit up quickly, especially if you are an older patient. This reduces the risk of dizzy or fainting spells. Alcohol may interfere with the effect of this medicine. Avoid alcoholic drinks. If you have a heart condition, like congestive heart failure, and notice that you are retaining water and have swelling in your hands or feet, contact your health care provider immediately. The use of this medicine may increase the chance of suicidal thoughts or actions. Pay special attention   to how you are responding while on this medicine. Any worsening of mood, or thoughts of suicide or dying should be reported to your health care professional right away. This medicine has caused reduced sperm counts in some men. This may interfere with the ability to father a  child. You should talk to your doctor or health care professional if you are concerned about your fertility. Women who become pregnant while using this medicine for seizures may enroll in the [place] Antiepileptic Drug Pregnancy Registry by calling 1-888-233-2334. This registry collects information about the safety of antiepileptic drug use during pregnancy. What side effects may I notice from receiving this medicine? Side effects that you should report to your doctor or health care professional as soon as possible: -allergic reactions like skin rash, itching or hives, swelling of the face, lips, or tongue -breathing problems -changes in vision -chest pain -confusion -jerking or unusual movements of any part of your body -loss of memory -muscle pain, tenderness, or weakness -suicidal thoughts or other mood changes -swelling of the ankles, feet, hands -unusual bruising or bleeding Side effects that usually do not require medical attention (report to your doctor or health care professional if they continue or are bothersome): -dizziness -drowsiness -dry mouth -headache -nausea -tremors -trouble sleeping -weight gain This list may not describe all possible side effects. Call your doctor for medical advice about side effects. You may report side effects to FDA at 1-800-FDA-1088. Where should I keep my medicine? Keep out of the reach of children. This medicine can be abused. Keep your medicine in a safe place to protect it from theft. Do not share this medicine with anyone. Selling or giving away this medicine is dangerous and against the law. This medicine may cause accidental overdose and death if it taken by other adults, children, or pets. Mix any unused medicine with a substance like cat litter or coffee grounds. Then throw the medicine away in a sealed container like a sealed bag or a coffee can with a lid. Do not use the medicine after the expiration date. Store at room  temperature between 15 and 30 degrees C (59 and 86 degrees F). NOTE: This sheet is a summary. It may not cover all possible information. If you have questions about this medicine, talk to your doctor, pharmacist, or health care provider.  2017 Elsevier/Gold Standard (2015-02-03 10:26:12)  

## 2016-01-06 ENCOUNTER — Ambulatory Visit: Payer: Self-pay | Admitting: Physical Medicine & Rehabilitation

## 2016-01-17 ENCOUNTER — Ambulatory Visit: Payer: Medicaid Other | Admitting: Family Medicine

## 2016-01-23 ENCOUNTER — Ambulatory Visit: Payer: Self-pay | Admitting: Physical Medicine & Rehabilitation

## 2016-01-24 ENCOUNTER — Telehealth: Payer: Self-pay | Admitting: Family Medicine

## 2016-01-24 NOTE — Telephone Encounter (Signed)
Patient called states she has been having flu-like symptoms, patient states they have been getting worse. Please f/up

## 2016-01-24 NOTE — Telephone Encounter (Signed)
She needs an office visit 

## 2016-01-26 ENCOUNTER — Telehealth: Payer: Self-pay

## 2016-01-26 NOTE — Telephone Encounter (Signed)
Writer called patient's daughter after trying to call patient regarding flu symptoms.  Writer offered an appt to see MD.  Patient never returned this call.

## 2016-01-30 ENCOUNTER — Ambulatory Visit: Payer: No Typology Code available for payment source | Admitting: Podiatry

## 2016-01-31 ENCOUNTER — Ambulatory Visit: Payer: Medicaid Other | Admitting: Physical Medicine & Rehabilitation

## 2016-01-31 ENCOUNTER — Encounter: Payer: Self-pay | Attending: Physical Medicine & Rehabilitation

## 2016-01-31 DIAGNOSIS — M545 Low back pain: Secondary | ICD-10-CM | POA: Insufficient documentation

## 2016-01-31 DIAGNOSIS — M79605 Pain in left leg: Secondary | ICD-10-CM | POA: Insufficient documentation

## 2016-02-13 ENCOUNTER — Telehealth: Payer: Self-pay | Admitting: Family Medicine

## 2016-02-13 DIAGNOSIS — M069 Rheumatoid arthritis, unspecified: Secondary | ICD-10-CM

## 2016-02-13 MED ORDER — PREDNISONE 10 MG PO TABS: 10.0000 mg | ORAL_TABLET | Freq: Two times a day (BID) | ORAL | 1 refills | Status: DC

## 2016-02-13 MED ORDER — CYCLOBENZAPRINE HCL 5 MG PO TABS: 5.0000 mg | ORAL_TABLET | Freq: Two times a day (BID) | ORAL | 2 refills | Status: DC | PRN

## 2016-02-13 NOTE — Telephone Encounter (Signed)
Done

## 2016-02-13 NOTE — Telephone Encounter (Signed)
Pt request a RF on Prednisone and Flexeril for pain. Please advise

## 2016-02-13 NOTE — Telephone Encounter (Signed)
Patient called the office to schedule an appt with PCP as soon as possible due to the pain that she is experiencing currently. PCP has no appt available. Please follow up.   Thank you.

## 2016-02-14 NOTE — Telephone Encounter (Signed)
Pt aware.

## 2016-02-20 ENCOUNTER — Encounter: Payer: Self-pay | Admitting: Family Medicine

## 2016-02-20 ENCOUNTER — Ambulatory Visit: Payer: Self-pay | Attending: Family Medicine | Admitting: Family Medicine

## 2016-02-20 VITALS — BP 123/78 | HR 114 | Temp 98.4°F | Ht 62.5 in | Wt 138.2 lb

## 2016-02-20 DIAGNOSIS — Z7952 Long term (current) use of systemic steroids: Secondary | ICD-10-CM | POA: Insufficient documentation

## 2016-02-20 DIAGNOSIS — J189 Pneumonia, unspecified organism: Secondary | ICD-10-CM | POA: Insufficient documentation

## 2016-02-20 DIAGNOSIS — R0602 Shortness of breath: Secondary | ICD-10-CM | POA: Insufficient documentation

## 2016-02-20 DIAGNOSIS — M791 Myalgia: Secondary | ICD-10-CM | POA: Insufficient documentation

## 2016-02-20 DIAGNOSIS — R0981 Nasal congestion: Secondary | ICD-10-CM | POA: Insufficient documentation

## 2016-02-20 DIAGNOSIS — M4800 Spinal stenosis, site unspecified: Secondary | ICD-10-CM | POA: Insufficient documentation

## 2016-02-20 DIAGNOSIS — M069 Rheumatoid arthritis, unspecified: Secondary | ICD-10-CM | POA: Insufficient documentation

## 2016-02-20 MED ORDER — GUAIFENESIN ER 600 MG PO TB12: 600.0000 mg | ORAL_TABLET | Freq: Two times a day (BID) | ORAL | 0 refills | Status: DC

## 2016-02-20 MED ORDER — ALBUTEROL SULFATE HFA 108 (90 BASE) MCG/ACT IN AERS: 2.0000 | INHALATION_SPRAY | Freq: Four times a day (QID) | RESPIRATORY_TRACT | 0 refills | Status: DC | PRN

## 2016-02-20 MED ORDER — ACETAMINOPHEN-CODEINE #3 300-30 MG PO TABS: 1.0000 | ORAL_TABLET | Freq: Two times a day (BID) | ORAL | 1 refills | Status: DC | PRN

## 2016-02-20 MED ORDER — LEVOFLOXACIN 500 MG PO TABS: 500.0000 mg | ORAL_TABLET | Freq: Every day | ORAL | 0 refills | Status: DC

## 2016-02-20 MED FILL — levoFLOXacin 500 MG TABS: 500 | 7 days supply | Qty: 7 | Fill #0

## 2016-02-20 NOTE — Progress Notes (Signed)
Subjective:  Patient ID: Amy Hall, female    DOB: [birth year]  Age: 51 y.o. MRN: [id number]  CC: Nasal Congestion; Shortness of Breath; Fatigue; Generalized Body Aches; Cough (productive- yellowish green); and Rheumatoid Arthritis   HPI Amy Hall is a 51 year old female with a history of rheumatoid arthritis, spinal stenosis who has not been able to see a rheumatologist due to lack of insurance and remains on chronic prednisone;  She was referred to rheumatology at her last office visit but is yet to see a rheumatologist.   She complains of cough productive of greenish sputum, shortness of breath, nasal congestions and generalized bodyaches. Has used TheraFlu, NyQuil with no relief in symptoms. Denies sinus tenderness and has not had a fever. Myalgias and arthralgias that different from the ones she experienced with a rheumatoid flare. Denies gastrointestinal symptoms.  Past Medical History:  Diagnosis Date  . Arthritis     Past Surgical History:  Procedure Laterality Date  . ELBOW SURGERY      Allergies  Allergen Reactions  . Gabapentin      Outpatient Medications Prior to Visit  Medication Sig Dispense Refill  . cyclobenzaprine (FLEXERIL) 5 MG tablet Take 1 tablet (5 mg total) by mouth 2 (two) times daily as needed for muscle spasms. 60 tablet 2  . ibuprofen (ADVIL,MOTRIN) 200 MG tablet Take 800 mg by mouth every 6 (six) hours as needed (swelling).    . predniSONE (DELTASONE) 10 MG tablet Take 1 tablet (10 mg total) by mouth 2 (two) times daily with a meal. 60 tablet 1  . pregabalin (LYRICA) 75 MG capsule Take 1 capsule (75 mg total) by mouth 2 (two) times daily. 14 capsule 0  . acetaminophen-codeine (TYLENOL #3) 300-30 MG tablet Take 1 tablet by mouth every 12 (twelve) hours as needed for moderate pain. 60 tablet 1  . Melatonin 5 MG TABS Take 5 mg by mouth Nightly. 2 tabs nightly     No facility-administered medications prior to visit.     ROS Review of  Systems Constitutional: Negative for activity change, appetite change and fatigue.  HENT: Negative for congestion, sinus pressure and sore throat.   Eyes: Negative for visual disturbance.  Respiratory: positive for cough, chest tightness, shortness of breath and wheezing.   Cardiovascular: Negative for chest pain and palpitations.  Gastrointestinal: Negative for abdominal distention, abdominal pain and constipation.  Endocrine: Negative for polydipsia.  Genitourinary: Negative for dysuria and frequency.  Musculoskeletal:       See hpi  Skin: positive for rash.  Neurological: Negative for tremors, light-headedness and numbness.  Hematological: Does not bruise/bleed easily.  Psychiatric/Behavioral: Negative for agitation and behavioral problems  Objective:  BP 123/78 (BP Location: Right Arm, Patient Position: Sitting, Cuff Size: Small)   Pulse (!) 114   Temp 98.4 F (36.9 C) (Oral)   Ht 5' 2.5" (1.588 m)   Wt 138 lb 3.2 oz (62.7 kg)   LMP 01/16/2015   SpO2 98%   BMI 24.87 kg/m   BP/Weight 02/20/2016 11/24/2015 [id number]  Systolic BP [id number]  Diastolic BP 78 85 74  Wt. (Lbs) 138.2 - 138.8  BMI 24.87 - 24.59      Physical Exam  Constitutional: She is oriented to person, place, and time. She appears well-developed and well-nourished.  Cardiovascular: Normal heart sounds and intact distal pulses.  Tachycardia present.   No murmur heard. Pulmonary/Chest: Effort normal. She has wheezes. She has no rales. She exhibits no tenderness.  Abdominal: Soft. Bowel sounds  are normal. She exhibits no distension and no mass. There is no tenderness.  Musculoskeletal:  Rheumatoid nodules in multiple joints  Neurological: She is alert and oriented to person, place, and time.     Assessment & Plan:   1. Rheumatoid arthritis involving multiple sites, unspecified rheumatoid factor presence [facility]) Rheumatology referral is pending - acetaminophen-codeine (TYLENOL #3) 300-30 MG tablet; Take 1  tablet by mouth every 12 (twelve) hours as needed for moderate pain.  Dispense: 60 tablet; Refill: 1  2. Community acquired pneumonia, unspecified laterality - levofloxacin (LEVAQUIN) 500 MG tablet; Take 1 tablet (500 mg total) by mouth daily.  Dispense: 7 tablet; Refill: 0 - albuterol (PROVENTIL HFA;VENTOLIN HFA) 108 (90 Base) MCG/ACT inhaler; Inhale 2 puffs into the lungs every 6 (six) hours as needed for wheezing or shortness of breath.  Dispense: 1 Inhaler; Refill: 0 - guaiFENesin (MUCINEX) 600 MG 12 hr tablet; Take 1 tablet (600 mg total) by mouth 2 (two) times daily.  Dispense: 30 tablet; Refill: 0   Meds ordered this encounter  Medications  . acetaminophen-codeine (TYLENOL #3) 300-30 MG tablet    Sig: Take 1 tablet by mouth every 12 (twelve) hours as needed for moderate pain.    Dispense:  60 tablet    Refill:  1  . levofloxacin (LEVAQUIN) 500 MG tablet    Sig: Take 1 tablet (500 mg total) by mouth daily.    Dispense:  7 tablet    Refill:  0  . albuterol (PROVENTIL HFA;VENTOLIN HFA) 108 (90 Base) MCG/ACT inhaler    Sig: Inhale 2 puffs into the lungs every 6 (six) hours as needed for wheezing or shortness of breath.    Dispense:  1 Inhaler    Refill:  0  . guaiFENesin (MUCINEX) 600 MG 12 hr tablet    Sig: Take 1 tablet (600 mg total) by mouth 2 (two) times daily.    Dispense:  30 tablet    Refill:  0    Follow-up: 6 weeks for follow-up of chronic medical conditions   [name] MD

## 2016-03-12 ENCOUNTER — Ambulatory Visit: Payer: Self-pay | Attending: Family Medicine | Admitting: Family Medicine

## 2016-03-12 ENCOUNTER — Encounter: Payer: Self-pay | Admitting: Family Medicine

## 2016-03-12 VITALS — BP 142/85 | HR 109 | Temp 98.2°F | Ht 62.0 in | Wt 138.2 lb

## 2016-03-12 DIAGNOSIS — M4800 Spinal stenosis, site unspecified: Secondary | ICD-10-CM | POA: Insufficient documentation

## 2016-03-12 DIAGNOSIS — K029 Dental caries, unspecified: Secondary | ICD-10-CM | POA: Insufficient documentation

## 2016-03-12 DIAGNOSIS — M069 Rheumatoid arthritis, unspecified: Secondary | ICD-10-CM | POA: Insufficient documentation

## 2016-03-12 DIAGNOSIS — J309 Allergic rhinitis, unspecified: Secondary | ICD-10-CM | POA: Insufficient documentation

## 2016-03-12 DIAGNOSIS — Z7952 Long term (current) use of systemic steroids: Secondary | ICD-10-CM | POA: Insufficient documentation

## 2016-03-12 DIAGNOSIS — R05 Cough: Secondary | ICD-10-CM | POA: Insufficient documentation

## 2016-03-12 DIAGNOSIS — Z888 Allergy status to other drugs, medicaments and biological substances status: Secondary | ICD-10-CM | POA: Insufficient documentation

## 2016-03-12 DIAGNOSIS — N3000 Acute cystitis without hematuria: Secondary | ICD-10-CM | POA: Insufficient documentation

## 2016-03-12 DIAGNOSIS — J302 Other seasonal allergic rhinitis: Secondary | ICD-10-CM

## 2016-03-12 DIAGNOSIS — R39198 Other difficulties with micturition: Secondary | ICD-10-CM | POA: Insufficient documentation

## 2016-03-12 DIAGNOSIS — R0602 Shortness of breath: Secondary | ICD-10-CM | POA: Insufficient documentation

## 2016-03-12 LAB — POCT URINALYSIS DIPSTICK
Bilirubin, UA: NEGATIVE
Blood, UA: NEGATIVE
Glucose, UA: NEGATIVE
Ketones, UA: NEGATIVE
NITRITE UA: NEGATIVE
PH UA: 6
PROTEIN UA: NEGATIVE
Spec Grav, UA: 1.01
UROBILINOGEN UA: 0.2

## 2016-03-12 MED ORDER — SULFAMETHOXAZOLE-TRIMETHOPRIM 800-160 MG PO TABS: 1.0000 | ORAL_TABLET | Freq: Two times a day (BID) | ORAL | 0 refills | Status: DC

## 2016-03-12 MED ORDER — CETIRIZINE HCL 10 MG PO TABS: 10.0000 mg | ORAL_TABLET | Freq: Every day | ORAL | 2 refills | Status: DC

## 2016-03-12 MED FILL — ?CETIRIZINE HCL 10 MG TABLE: 10 | 30 days supply | Qty: 30 | Fill #0

## 2016-03-12 MED FILL — VENTOLIN HFA 90 MCG INHALER: 108 (90 BAS | 25 days supply | Qty: 18 | Fill #0

## 2016-03-12 MED FILL — SULFAMETHOXAZOLE/TMP DS TAB: 800-160 | 10 days supply | Qty: 20 | Fill #0

## 2016-03-12 NOTE — Patient Instructions (Signed)

## 2016-03-12 NOTE — Progress Notes (Signed)
Subjective:  Patient ID: Amy Hall, female    DOB: 12-21-1965  Age: 51 y.o. MRN: [id number]  CC: Shortness of Breath; Cough; Dental Pain; and burning when urinating   HPI Amy Hall s a 51 year old female with a history of rheumatoid arthritis, spinal stenosis who has not been able to see a rheumatologist due to lack of insurance and remains on chronic prednisone.  Today she complains of dysuria, urinary frequency for the last 1 week which started after sexual intercourse. Denies fever, abdominal pain, back pain.  Complains of persisting "inability to catch her breath"since her last visit one month ago where she was treated for community acquired pneumonia and completed a course of Levaquin but never picked up a Proventil inhaler due to cost.  She also complains of multiple teeth ache at the site where her dental filings fellout; previously referred to a dentist but is yet to hear from them.  Past Medical History:  Diagnosis Date  . Arthritis     Past Surgical History:  Procedure Laterality Date  . ELBOW SURGERY      Allergies  Allergen Reactions  . Gabapentin      Outpatient Medications Prior to Visit  Medication Sig Dispense Refill  . acetaminophen-codeine (TYLENOL #3) 300-30 MG tablet Take 1 tablet by mouth every 12 (twelve) hours as needed for moderate pain. 60 tablet 1  . cyclobenzaprine (FLEXERIL) 5 MG tablet Take 1 tablet (5 mg total) by mouth 2 (two) times daily as needed for muscle spasms. 60 tablet 2  . ibuprofen (ADVIL,MOTRIN) 200 MG tablet Take 800 mg by mouth every 6 (six) hours as needed (swelling).    . predniSONE (DELTASONE) 10 MG tablet Take 1 tablet (10 mg total) by mouth 2 (two) times daily with a meal. 60 tablet 1  . albuterol (PROVENTIL HFA;VENTOLIN HFA) 108 (90 Base) MCG/ACT inhaler Inhale 2 puffs into the lungs every 6 (six) hours as needed for wheezing or shortness of breath. (Patient not taking: Reported on 03/12/2016) 1 Inhaler 0  .  Melatonin 5 MG TABS Take 5 mg by mouth Nightly. 2 tabs nightly    . pregabalin (LYRICA) 75 MG capsule Take 1 capsule (75 mg total) by mouth 2 (two) times daily. (Patient not taking: Reported on 03/12/2016) 14 capsule 0  . guaiFENesin (MUCINEX) 600 MG 12 hr tablet Take 1 tablet (600 mg total) by mouth 2 (two) times daily. (Patient not taking: Reported on 03/12/2016) 30 tablet 0  . levofloxacin (LEVAQUIN) 500 MG tablet Take 1 tablet (500 mg total) by mouth daily. (Patient not taking: Reported on 03/12/2016) 7 tablet 0   No facility-administered medications prior to visit.     ROS Review of Systems Constitutional: Negative for activity change, appetite change and fatigue.  HENT: Negative for congestion, sinus pressure and sore throat.   Eyes: Negative for visual disturbance.  Respiratory: positive for cough, chest tightness, shortness of breath, no wheezing.   Cardiovascular: Negative for chest pain and palpitations.  Gastrointestinal: Negative for abdominal distention, abdominal pain and constipation.  Endocrine: Negative for polydipsia.  Genitourinary: see hpi.  Musculoskeletal:       See hpi  Skin: positive for rash.  Neurological: Negative for tremors, light-headedness and numbness.  Hematological: Does not bruise/bleed easily.  Psychiatric/Behavioral: Negative for agitation and behavioral problems Objective:  BP (!) 142/85 (BP Location: Right Arm, Patient Position: Sitting, Cuff Size: Small)   Pulse (!) 109   Temp 98.2 F (36.8 C) (Oral)   Ht 5\' 2"  (1.575  m)   Wt 138 lb 3.2 oz (62.7 kg)   LMP 01/16/2015   SpO2 99%   BMI 25.28 kg/m   BP/Weight 03/12/2016 02/20/2016 [id number]  Systolic BP [id number]  Diastolic BP 85 78 85  Wt. (Lbs) 138.2 138.2 -  BMI 25.28 24.87 -      Physical Exam Constitutional: She is oriented to person, place, and time. She appears well-developed and well-nourished.  HEENT: oropharyngeal erythema, mucus in oropharynx. Normal TM bilaterally; multiple  dental caries Cardiovascular: Normal heart sounds and intact distal pulses.  Tachycardia present.   No murmur heard. Pulmonary/Chest: Effort normal. She has no wheezing. She has no rales. She exhibits no tenderness.  Abdominal: Soft. Bowel sounds are normal. She exhibits no distension and no mass. There is no tenderness.  Musculoskeletal:  Rheumatoid nodules in multiple joints  No CVA tenderness Neurological: She is alert and oriented to person, place, and time.   Assessment & Plan:   1. Acute cystitis without hematuria - Urinalysis Dipstick - sulfamethoxazole-trimethoprim (BACTRIM DS) 800-160 MG tablet; Take 1 tablet by mouth 2 (two) times daily.  Dispense: 20 tablet; Refill: 0  2. Dental caries - Ambulatory referral to Dentistry  3. Rheumatoid arthritis involving multiple sites, unspecified rheumatoid factor presence (HCC) Continue prednisone, Flexeril - Ambulatory referral to Rheumatology  4. Chronic seasonal allergic rhinitis due to other allergen Patient advised to pickup MDI from the pharmacy - cetirizine (ZYRTEC) 10 MG tablet; Take 1 tablet (10 mg total) by mouth daily.  Dispense: 30 tablet; Refill: 2   Meds ordered this encounter  Medications  . sulfamethoxazole-trimethoprim (BACTRIM DS) 800-160 MG tablet    Sig: Take 1 tablet by mouth 2 (two) times daily.    Dispense:  20 tablet    Refill:  0  . cetirizine (ZYRTEC) 10 MG tablet    Sig: Take 1 tablet (10 mg total) by mouth daily.    Dispense:  30 tablet    Refill:  2    Follow-up: Return in about 2 months (around 05/10/2016) for Follow-up of chronic medical conditions.   [name] MD

## 2016-03-14 ENCOUNTER — Ambulatory Visit: Payer: Medicaid Other | Attending: Family Medicine

## 2016-03-20 ENCOUNTER — Other Ambulatory Visit: Payer: Self-pay | Admitting: *Deleted

## 2016-03-20 DIAGNOSIS — J189 Pneumonia, unspecified organism: Secondary | ICD-10-CM

## 2016-03-20 MED ORDER — ALBUTEROL SULFATE HFA 108 (90 BASE) MCG/ACT IN AERS: 2.0000 | INHALATION_SPRAY | Freq: Four times a day (QID) | RESPIRATORY_TRACT | 3 refills | Status: AC | PRN

## 2016-03-20 NOTE — Telephone Encounter (Signed)
PRINTED FOR PASS PROGRAM 

## 2016-04-03 ENCOUNTER — Emergency Department (HOSPITAL_COMMUNITY): Payer: Self-pay

## 2016-04-03 ENCOUNTER — Encounter (HOSPITAL_COMMUNITY): Payer: Self-pay

## 2016-04-03 ENCOUNTER — Other Ambulatory Visit (HOSPITAL_COMMUNITY)
Admission: RE | Admit: 2016-04-03 | Discharge: 2016-04-03 | Disposition: A | Discharge status: Home / Self Care | Payer: Self-pay | Source: Ambulatory Visit | Attending: Internal Medicine | Admitting: Internal Medicine

## 2016-04-03 ENCOUNTER — Ambulatory Visit: Payer: Self-pay | Attending: Internal Medicine | Admitting: Internal Medicine

## 2016-04-03 ENCOUNTER — Telehealth: Payer: Self-pay | Admitting: Family Medicine

## 2016-04-03 ENCOUNTER — Encounter: Payer: Self-pay | Admitting: Internal Medicine

## 2016-04-03 ENCOUNTER — Inpatient Hospital Stay (HOSPITAL_COMMUNITY)
Admission: EM | Admit: 2016-04-03 | Discharge: 2016-04-06 | DRG: 812 | Disposition: A | Discharge status: Home / Self Care | Payer: Self-pay | Attending: Nephrology | Admitting: Nephrology

## 2016-04-03 VITALS — BP 123/69 | HR 124 | Temp 98.0°F | Resp 16 | Wt 145.2 lb

## 2016-04-03 DIAGNOSIS — K922 Gastrointestinal hemorrhage, unspecified: Secondary | ICD-10-CM | POA: Diagnosis present

## 2016-04-03 DIAGNOSIS — E538 Deficiency of other specified B group vitamins: Secondary | ICD-10-CM

## 2016-04-03 DIAGNOSIS — Z8701 Personal history of pneumonia (recurrent): Secondary | ICD-10-CM

## 2016-04-03 DIAGNOSIS — Z7952 Long term (current) use of systemic steroids: Secondary | ICD-10-CM | POA: Insufficient documentation

## 2016-04-03 DIAGNOSIS — T7840XA Allergy, unspecified, initial encounter: Secondary | ICD-10-CM | POA: Diagnosis present

## 2016-04-03 DIAGNOSIS — R0602 Shortness of breath: Secondary | ICD-10-CM | POA: Insufficient documentation

## 2016-04-03 DIAGNOSIS — R1110 Vomiting, unspecified: Secondary | ICD-10-CM | POA: Insufficient documentation

## 2016-04-03 DIAGNOSIS — K449 Diaphragmatic hernia without obstruction or gangrene: Secondary | ICD-10-CM | POA: Diagnosis present

## 2016-04-03 DIAGNOSIS — D473 Essential (hemorrhagic) thrombocythemia: Secondary | ICD-10-CM | POA: Diagnosis present

## 2016-04-03 DIAGNOSIS — L299 Pruritus, unspecified: Secondary | ICD-10-CM | POA: Insufficient documentation

## 2016-04-03 DIAGNOSIS — K921 Melena: Secondary | ICD-10-CM | POA: Diagnosis present

## 2016-04-03 DIAGNOSIS — E876 Hypokalemia: Secondary | ICD-10-CM | POA: Diagnosis present

## 2016-04-03 DIAGNOSIS — R300 Dysuria: Secondary | ICD-10-CM | POA: Diagnosis present

## 2016-04-03 DIAGNOSIS — D62 Acute posthemorrhagic anemia: Principal | ICD-10-CM | POA: Diagnosis present

## 2016-04-03 DIAGNOSIS — N390 Urinary tract infection, site not specified: Secondary | ICD-10-CM | POA: Diagnosis present

## 2016-04-03 DIAGNOSIS — D649 Anemia, unspecified: Secondary | ICD-10-CM | POA: Diagnosis present

## 2016-04-03 DIAGNOSIS — R112 Nausea with vomiting, unspecified: Secondary | ICD-10-CM | POA: Diagnosis present

## 2016-04-03 DIAGNOSIS — Z720 Tobacco use: Secondary | ICD-10-CM | POA: Diagnosis present

## 2016-04-03 DIAGNOSIS — J45901 Unspecified asthma with (acute) exacerbation: Secondary | ICD-10-CM | POA: Diagnosis present

## 2016-04-03 DIAGNOSIS — M1990 Unspecified osteoarthritis, unspecified site: Secondary | ICD-10-CM | POA: Diagnosis present

## 2016-04-03 DIAGNOSIS — M810 Age-related osteoporosis without current pathological fracture: Secondary | ICD-10-CM | POA: Diagnosis present

## 2016-04-03 DIAGNOSIS — M069 Rheumatoid arthritis, unspecified: Secondary | ICD-10-CM

## 2016-04-03 DIAGNOSIS — N3000 Acute cystitis without hematuria: Secondary | ICD-10-CM

## 2016-04-03 DIAGNOSIS — K2901 Acute gastritis with bleeding: Secondary | ICD-10-CM

## 2016-04-03 DIAGNOSIS — Z8249 Family history of ischemic heart disease and other diseases of the circulatory system: Secondary | ICD-10-CM

## 2016-04-03 DIAGNOSIS — K210 Gastro-esophageal reflux disease with esophagitis: Secondary | ICD-10-CM | POA: Diagnosis present

## 2016-04-03 DIAGNOSIS — Z79899 Other long term (current) drug therapy: Secondary | ICD-10-CM

## 2016-04-03 DIAGNOSIS — F17210 Nicotine dependence, cigarettes, uncomplicated: Secondary | ICD-10-CM | POA: Diagnosis present

## 2016-04-03 DIAGNOSIS — S00522A Blister (nonthermal) of oral cavity, initial encounter: Secondary | ICD-10-CM | POA: Diagnosis present

## 2016-04-03 DIAGNOSIS — K5900 Constipation, unspecified: Secondary | ICD-10-CM | POA: Diagnosis present

## 2016-04-03 DIAGNOSIS — R609 Edema, unspecified: Secondary | ICD-10-CM | POA: Insufficient documentation

## 2016-04-03 DIAGNOSIS — R1310 Dysphagia, unspecified: Secondary | ICD-10-CM | POA: Diagnosis present

## 2016-04-03 DIAGNOSIS — D509 Iron deficiency anemia, unspecified: Secondary | ICD-10-CM | POA: Diagnosis present

## 2016-04-03 DIAGNOSIS — Z888 Allergy status to other drugs, medicaments and biological substances status: Secondary | ICD-10-CM | POA: Insufficient documentation

## 2016-04-03 DIAGNOSIS — D72829 Elevated white blood cell count, unspecified: Secondary | ICD-10-CM

## 2016-04-03 DIAGNOSIS — A419 Sepsis, unspecified organism: Secondary | ICD-10-CM | POA: Diagnosis present

## 2016-04-03 HISTORY — DX: Age-related osteoporosis without current pathological fracture: M81.0

## 2016-04-03 HISTORY — DX: Unspecified asthma, uncomplicated: J45.909

## 2016-04-03 HISTORY — DX: Other intervertebral disc degeneration, lumbar region: M51.36

## 2016-04-03 HISTORY — DX: Rheumatoid arthritis, unspecified: M06.9

## 2016-04-03 HISTORY — DX: Other intervertebral disc degeneration, lumbar region without mention of lumbar back pain or lower extremity pain: M51.369

## 2016-04-03 HISTORY — DX: Other osteoporosis without current pathological fracture: M81.8

## 2016-04-03 HISTORY — DX: Sleep disorder, unspecified: G47.9

## 2016-04-03 LAB — URINALYSIS, ROUTINE W REFLEX MICROSCOPIC
Bilirubin Urine: NEGATIVE
GLUCOSE, UA: NEGATIVE mg/dL
Hgb urine dipstick: NEGATIVE
Ketones, ur: NEGATIVE mg/dL
NITRITE: NEGATIVE
PH: 7 (ref 5.0–8.0)
Protein, ur: NEGATIVE mg/dL
SPECIFIC GRAVITY, URINE: 1.009 (ref 1.005–1.030)

## 2016-04-03 LAB — I-STAT CG4 LACTIC ACID, ED: Lactic Acid, Venous: 2.29 mmol/L (ref 0.5–1.9)

## 2016-04-03 LAB — COMPREHENSIVE METABOLIC PANEL
ALK PHOS: 48 U/L (ref 38–126)
ALT: 25 U/L (ref 14–54)
ANION GAP: 9 (ref 5–15)
AST: 21 U/L (ref 15–41)
Albumin: 2.7 g/dL — ABNORMAL LOW (ref 3.5–5.0)
BUN: 10 mg/dL (ref 6–20)
CALCIUM: 8.7 mg/dL — AB (ref 8.9–10.3)
CO2: 26 mmol/L (ref 22–32)
CREATININE: 0.68 mg/dL (ref 0.44–1.00)
Chloride: 105 mmol/L (ref 101–111)
Glucose, Bld: 117 mg/dL — ABNORMAL HIGH (ref 65–99)
Potassium: 3.3 mmol/L — ABNORMAL LOW (ref 3.5–5.1)
Sodium: 140 mmol/L (ref 135–145)
Total Bilirubin: 0.3 mg/dL (ref 0.3–1.2)
Total Protein: 5.8 g/dL — ABNORMAL LOW (ref 6.5–8.1)

## 2016-04-03 LAB — DIFFERENTIAL
BASOS ABS: 0 10*3/uL (ref 0.0–0.1)
BLASTS: 0 %
Band Neutrophils: 0 %
Basophils Relative: 0 %
EOS PCT: 0 %
Eosinophils Absolute: 0 10*3/uL (ref 0.0–0.7)
Lymphocytes Relative: 9 %
Lymphs Abs: 1.6 10*3/uL (ref 0.7–4.0)
METAMYELOCYTES PCT: 0 %
MONOS PCT: 8 %
Monocytes Absolute: 1.4 10*3/uL — ABNORMAL HIGH (ref 0.1–1.0)
Myelocytes: 0 %
NEUTROS ABS: 14.5 10*3/uL — AB (ref 1.7–7.7)
NEUTROS PCT: 83 %
Other: 0 %
Promyelocytes Absolute: 0 %
nRBC: 0 /100 WBC

## 2016-04-03 LAB — FERRITIN: FERRITIN: 6 ng/mL — AB (ref 11–307)

## 2016-04-03 LAB — POCT URINALYSIS DIPSTICK
BILIRUBIN UA: NEGATIVE
GLUCOSE UA: NEGATIVE
KETONES UA: NEGATIVE
Nitrite, UA: NEGATIVE
Protein, UA: NEGATIVE
RBC UA: NEGATIVE
SPEC GRAV UA: 1.015 (ref 1.030–1.035)
Urobilinogen, UA: 0.2 (ref ?–2.0)
pH, UA: 7 (ref 5.0–8.0)

## 2016-04-03 LAB — CBC
HEMATOCRIT: 20.7 % — AB (ref 36.0–46.0)
Hemoglobin: 5.7 g/dL — CL (ref 12.0–15.0)
MCH: 20.4 pg — AB (ref 26.0–34.0)
MCHC: 27.5 g/dL — ABNORMAL LOW (ref 30.0–36.0)
MCV: 73.9 fL — AB (ref 78.0–100.0)
PLATELETS: 589 10*3/uL — AB (ref 150–400)
RBC: 2.8 MIL/uL — ABNORMAL LOW (ref 3.87–5.11)
RDW: 16.6 % — AB (ref 11.5–15.5)
WBC: 17.5 10*3/uL — AB (ref 4.0–10.5)

## 2016-04-03 LAB — IRON AND TIBC: TIBC: 315 ug/dL (ref 250–450)

## 2016-04-03 LAB — VITAMIN B12: Vitamin B-12: 191 pg/mL (ref 180–914)

## 2016-04-03 LAB — C-REACTIVE PROTEIN: CRP: 2.6 mg/dL — AB (ref <1.0)

## 2016-04-03 LAB — PREPARE RBC (CROSSMATCH)

## 2016-04-03 LAB — RAPID STREP SCREEN (MED CTR MEBANE ONLY): Streptococcus, Group A Screen (Direct): NEGATIVE

## 2016-04-03 LAB — PROTIME-INR
INR: 1.02
Prothrombin Time: 13.5 seconds (ref 11.4–15.2)

## 2016-04-03 LAB — RETICULOCYTES
RBC.: 2.48 MIL/uL — ABNORMAL LOW (ref 3.87–5.11)
Retic Count, Absolute: 74.4 10*3/uL (ref 19.0–186.0)
Retic Ct Pct: 3 % (ref 0.4–3.1)

## 2016-04-03 LAB — POC OCCULT BLOOD, ED: FECAL OCCULT BLD: POSITIVE — AB

## 2016-04-03 LAB — LACTATE DEHYDROGENASE: LDH: 183 U/L (ref 98–192)

## 2016-04-03 LAB — PROCALCITONIN

## 2016-04-03 LAB — SEDIMENTATION RATE: SED RATE: 35 mm/h — AB (ref 0–22)

## 2016-04-03 LAB — ABO/RH: ABO/RH(D): B POS

## 2016-04-03 LAB — LIPASE, BLOOD: Lipase: 26 U/L (ref 11–51)

## 2016-04-03 LAB — SAVE SMEAR

## 2016-04-03 MED ORDER — ACETAMINOPHEN 325 MG PO TABS: 650.0000 mg | ORAL_TABLET | Freq: Four times a day (QID) | ORAL | Status: DC | PRN

## 2016-04-03 MED ORDER — ACETAMINOPHEN-CODEINE #3 300-30 MG PO TABS: 1.0000 | ORAL_TABLET | Freq: Two times a day (BID) | ORAL | 1 refills | Status: DC | PRN

## 2016-04-03 MED ORDER — DIPHENHYDRAMINE HCL 50 MG/ML IJ SOLN
25.0000 mg | Freq: Once | INTRAMUSCULAR | Status: AC
  Administered 2016-04-03: 25 mg via INTRAVENOUS
  Filled 2016-04-03: qty 1

## 2016-04-03 MED ORDER — DIPHENHYDRAMINE HCL 25 MG PO CAPS
25.0000 mg | ORAL_CAPSULE | Freq: Two times a day (BID) | ORAL | Status: DC
  Administered 2016-04-04 – 2016-04-06 (×4): 25 mg via ORAL
  Filled 2016-04-03 (×5): qty 1

## 2016-04-03 MED ORDER — PREDNISONE 10 MG PO TABS
10.0000 mg | ORAL_TABLET | Freq: Two times a day (BID) | ORAL | Status: DC
  Administered 2016-04-04: 10 mg via ORAL
  Filled 2016-04-03: qty 1

## 2016-04-03 MED ORDER — ACETAMINOPHEN 650 MG RE SUPP: 650.0000 mg | Freq: Four times a day (QID) | RECTAL | Status: DC | PRN

## 2016-04-03 MED ORDER — ONDANSETRON HCL 4 MG/2ML IJ SOLN
4.0000 mg | Freq: Three times a day (TID) | INTRAMUSCULAR | Status: DC | PRN
  Administered 2016-04-03 – 2016-04-04 (×2): 4 mg via INTRAVENOUS
  Filled 2016-04-03 (×2): qty 2

## 2016-04-03 MED ORDER — NICOTINE 21 MG/24HR TD PT24
21.0000 mg | MEDICATED_PATCH | Freq: Every day | TRANSDERMAL | Status: DC
  Administered 2016-04-04 – 2016-04-06 (×3): 21 mg via TRANSDERMAL
  Filled 2016-04-03 (×4): qty 1

## 2016-04-03 MED ORDER — SODIUM CHLORIDE 0.9 % IV SOLN
INTRAVENOUS | Status: DC
  Administered 2016-04-03 – 2016-04-05 (×2): via INTRAVENOUS

## 2016-04-03 MED ORDER — SODIUM CHLORIDE 0.9 % IV BOLUS (SEPSIS)
1000.0000 mL | Freq: Once | INTRAVENOUS | Status: AC
  Administered 2016-04-03: 1000 mL via INTRAVENOUS

## 2016-04-03 MED ORDER — HYDROCORTISONE NA SUCCINATE PF 100 MG IJ SOLR: 50.0000 mg | Freq: Once | INTRAMUSCULAR | Status: DC

## 2016-04-03 MED ORDER — ZOLPIDEM TARTRATE 5 MG PO TABS
5.0000 mg | ORAL_TABLET | Freq: Every evening | ORAL | Status: DC | PRN
  Administered 2016-04-06: 5 mg via ORAL
  Filled 2016-04-03: qty 1

## 2016-04-03 MED ORDER — FAMOTIDINE IN NACL 20-0.9 MG/50ML-% IV SOLN
20.0000 mg | Freq: Once | INTRAVENOUS | Status: AC
  Administered 2016-04-03: 20 mg via INTRAVENOUS
  Filled 2016-04-03: qty 50

## 2016-04-03 MED ORDER — SODIUM CHLORIDE 0.9 % IV SOLN
10.0000 mL/h | Freq: Once | INTRAVENOUS | Status: AC
  Administered 2016-04-03: 10 mL/h via INTRAVENOUS

## 2016-04-03 MED ORDER — FAMOTIDINE 20 MG PO TABS
20.0000 mg | ORAL_TABLET | Freq: Two times a day (BID) | ORAL | Status: DC
  Administered 2016-04-04 – 2016-04-06 (×4): 20 mg via ORAL
  Filled 2016-04-03 (×5): qty 1

## 2016-04-03 MED ORDER — PREDNISONE 10 MG PO TABS: 10.0000 mg | ORAL_TABLET | Freq: Two times a day (BID) | ORAL | 1 refills | Status: DC

## 2016-04-03 MED ORDER — IPRATROPIUM BROMIDE 0.02 % IN SOLN
0.5000 mg | RESPIRATORY_TRACT | Status: DC
  Filled 2016-04-03 (×2): qty 2.5

## 2016-04-03 MED ORDER — CYCLOBENZAPRINE HCL 5 MG PO TABS
5.0000 mg | ORAL_TABLET | Freq: Two times a day (BID) | ORAL | Status: DC | PRN
Start: 2016-04-03 — End: 2016-04-06
  Administered 2016-04-03 – 2016-04-05 (×4): 5 mg via ORAL
  Filled 2016-04-03 (×4): qty 1

## 2016-04-03 MED ORDER — LORATADINE 10 MG PO TABS
10.0000 mg | ORAL_TABLET | Freq: Every day | ORAL | Status: DC
  Administered 2016-04-04 – 2016-04-06 (×2): 10 mg via ORAL
  Filled 2016-04-03 (×3): qty 1

## 2016-04-03 MED ORDER — AZITHROMYCIN 500 MG PO TABS: 250.0000 mg | ORAL_TABLET | ORAL | Status: DC

## 2016-04-03 MED ORDER — CEFTRIAXONE SODIUM 1 G IJ SOLR
1.0000 g | INTRAMUSCULAR | Status: AC
  Administered 2016-04-04 – 2016-04-05 (×3): 1 g via INTRAVENOUS
  Filled 2016-04-03 (×4): qty 10

## 2016-04-03 MED ORDER — AZITHROMYCIN 500 MG PO TABS
500.0000 mg | ORAL_TABLET | Freq: Every day | ORAL | Status: AC
  Administered 2016-04-03: 500 mg via ORAL
  Filled 2016-04-03: qty 1

## 2016-04-03 MED ORDER — POTASSIUM CHLORIDE 20 MEQ/15ML (10%) PO SOLN
20.0000 meq | Freq: Once | ORAL | Status: AC
  Administered 2016-04-03: 20 meq via ORAL
  Filled 2016-04-03: qty 15

## 2016-04-03 MED ORDER — LEVALBUTEROL HCL 1.25 MG/0.5ML IN NEBU
1.2500 mg | INHALATION_SOLUTION | Freq: Four times a day (QID) | RESPIRATORY_TRACT | Status: DC
  Filled 2016-04-03 (×3): qty 0.5

## 2016-04-03 MED ORDER — DM-GUAIFENESIN ER 30-600 MG PO TB12
1.0000 | ORAL_TABLET | Freq: Two times a day (BID) | ORAL | Status: DC
  Administered 2016-04-03 – 2016-04-06 (×5): 1 via ORAL
  Filled 2016-04-03 (×6): qty 1

## 2016-04-03 MED ORDER — METHYLPREDNISOLONE SODIUM SUCC 125 MG IJ SOLR
60.0000 mg | Freq: Three times a day (TID) | INTRAMUSCULAR | Status: DC
  Administered 2016-04-04 (×2): 60 mg via INTRAVENOUS
  Filled 2016-04-03 (×2): qty 2

## 2016-04-03 MED ORDER — ONDANSETRON HCL 4 MG/2ML IJ SOLN
4.0000 mg | Freq: Once | INTRAMUSCULAR | Status: AC
  Administered 2016-04-03: 4 mg via INTRAVENOUS
  Filled 2016-04-03: qty 2

## 2016-04-03 MED ORDER — SODIUM CHLORIDE 0.9% FLUSH
3.0000 mL | Freq: Two times a day (BID) | INTRAVENOUS | Status: DC
  Administered 2016-04-03 – 2016-04-06 (×5): 3 mL via INTRAVENOUS

## 2016-04-03 MED ORDER — METHYLPREDNISOLONE SODIUM SUCC 125 MG IJ SOLR
125.0000 mg | Freq: Once | INTRAMUSCULAR | Status: AC
  Administered 2016-04-03: 125 mg via INTRAMUSCULAR

## 2016-04-03 MED ORDER — IPRATROPIUM-ALBUTEROL 0.5-2.5 (3) MG/3ML IN SOLN
3.0000 mL | Freq: Once | RESPIRATORY_TRACT | Status: AC
  Administered 2016-04-03: 3 mL via RESPIRATORY_TRACT

## 2016-04-03 NOTE — ED Notes (Signed)
Patient sitting up in bed, eating dinner.

## 2016-04-03 NOTE — Progress Notes (Signed)
Amy Hall, is a 51 y.o. female  NUU:[id number]  HKV:[id number]  DOB - September 26, 1965  Chief Complaint  Patient presents with  . Vomiting  . Vaginal Itching        Subjective:   Amy Hall is a 51 y.o. female here today for a follow up visit for dysuria, w/ hx of RA, spinal stenosis, and hx of Pna trx w/ Levoquin in Jan 2018.  Last saw Dr Jarold Song 2/26 and treated w/ 10 day course of bactrim for cystitis. Of note, upon my arrival to room, pt was struggling for breath/sob/dynea/nonverbal b/c could not speak in full sentences as all.  When CMA was checking her in, she was able to give cc: of sob/trouble breathing and dysuria w/ pruritis.  When I came back to room after giving cma orders for solumedrol and duonebs treatment, found pt was able to speak to me w/o distress. She c/o of facial swelling since this Sunday, appears to be getting worse, w/ worsening cough/difficulty breathing.  She states she ran out of her chronic steroids for which she takes for RA.  Starts having severe joint pains when off prednisone.   Patient has No headache, No chest pain, No abdominal pain - No Nausea, No new weakness tingling or numbness, No Cough - SOB.  No problems updated.  ALLERGIES: Allergies  Allergen Reactions  . Gabapentin     PAST MEDICAL HISTORY: Past Medical History:  Diagnosis Date  . Arthritis     MEDICATIONS AT HOME: Prior to Admission medications   Medication Sig Start Date End Date Taking? Authorizing Provider  acetaminophen-codeine (TYLENOL #3) 300-30 MG tablet Take 1 tablet by mouth every 12 (twelve) hours as needed for moderate pain. 02/20/16   [name], MD  albuterol (PROVENTIL HFA;VENTOLIN HFA) 108 (90 Base) MCG/ACT inhaler Inhale 2 puffs into the lungs every 6 (six) hours as needed for wheezing or shortness of breath. 03/20/16   [name], MD  cetirizine (ZYRTEC) 10 MG tablet Take 1 tablet (10 mg total) by mouth daily. 03/12/16   [name], MD  cyclobenzaprine  (FLEXERIL) 5 MG tablet Take 1 tablet (5 mg total) by mouth 2 (two) times daily as needed for muscle spasms. 02/13/16   [name], MD  ibuprofen (ADVIL,MOTRIN) 200 MG tablet Take 800 mg by mouth every 6 (six) hours as needed (swelling).    Historical Provider, MD  Melatonin 5 MG TABS Take 5 mg by mouth Nightly. 2 tabs nightly    Historical Provider, MD  predniSONE (DELTASONE) 10 MG tablet Take 1 tablet (10 mg total) by mouth 2 (two) times daily with a meal. 04/03/16   Maren Reamer, MD  pregabalin (LYRICA) 75 MG capsule Take 1 capsule (75 mg total) by mouth 2 (two) times daily. Patient not taking: Reported on 03/12/2016 01/02/16   Trula Slade, DPM  sulfamethoxazole-trimethoprim (BACTRIM DS) 800-160 MG tablet Take 1 tablet by mouth 2 (two) times daily. Patient not taking: Reported on 04/03/2016 03/12/16   [name], MD     Objective:   Vitals:   04/03/16 1434  BP: 123/69  Pulse: (!) 124  Resp: 16  Temp: 98 F (36.7 C)  TempSrc: Oral  SpO2: 98%  Weight: 145 lb 3.2 oz (65.9 kg)    Exam Initial exam:  Initially in distress w/ dypsnea, unable to breath, unable to speak due to doe.  Noted plethoric face, facial edema noted as well, with tearing of eyes bilat. No neck stridor noted bilat.  Tachycardic,  reg si s2. Lungs at time were diminished at time, but no audible wheezes or crackles.  Exam after duonebs/steroids: HEENT:   Persistent facial edema, around jawline. No stridor, now able to speak in full sentences. Neck: supple, no JVD. No cervical lymphadenopathy.  Chest: persistent diminished bs, exp wheezing, tight airways. CVS: S1 S2 regular, persistent tachycardic, no murmurs/gallups or rubs. Abdomen: Bowel sounds active, Non tender and not distended with no gaurding, rigidity or rebound. Extremities: B/L Lower Ext shows no edema, both legs are warm to touch Neurology: Awake alert, and oriented X 3, CN II-XII grossly intact, Non focal Skin:No Rash  Data Review Lab Results    Component Value Date   HGBA1C 5.7 09/21/2015    Depression screen [facility] 2/9 04/03/2016 03/12/2016 02/20/2016 11/23/2015 11/04/2015  Decreased Interest 2 1 2 1 2   Down, Depressed, Hopeless 2 2 2 1 3   PHQ - 2 Score 4 3 4 2 5   Altered sleeping 2 2 1 3 3   Tired, decreased energy 2 2 2 2 1   Change in appetite 2 2 1 2 2   Feeling bad or failure about yourself  2 2 2  0 3  Trouble concentrating 2 2 1  0 3  Moving slowly or fidgety/restless 2 1 1  0 2  Suicidal thoughts 2 0 1 0 1  PHQ-9 Score 18 14 13 9 20   Difficult doing work/chores - - - - Very difficult      Assessment & Plan   1. SOB (shortness of breath), persistent, w/ facial edema, pt on chronic steroids but ran out last few days - initially felt aecopd/asthma exacerbation, slightly improvement after nebs, but still w/ facial edema and residual upper airways wheeze after exam/nebs.  Facial swelling - tooth infections? Allergic reaction less likely since has been on chronoc steroids. - ipratropium-albuterol (DUONEB) 0.5-2.5 (3) MG/3ML nebulizer solution 3 mL; Take 3 mLs by nebulization once. - methylPREDNISolone sodium succinate (SOLU-MEDROL) 125 mg/2 mL injection 125 mg; Inject 2 mLs (125 mg total) into the muscle once. - trx to [facility] for further eval.    2. Dysuria - POCT urinalysis dipstick  - trace LE noted on urine - going to outpt er, will defer to ER for trx options. - Urine cytology ancillary only   3. Rheumatoid arthritis involving multiple sites, unspecified rheumatoid factor presence (HCC) - hx of chronic steroid use, imune compromised state. - predniSONE (DELTASONE) 10 MG tablet; Take 1 tablet (10 mg total) by mouth 2 (two) times daily with a meal.  Dispense: 60 tablet; Refill: 1     Patient have been counseled extensively about nutrition and exercise  No Follow-up on file.  The patient was given clear instructions to go to ER or return to medical center if symptoms don't improve, worsen or new problems develop.  The patient verbalized understanding. The patient was told to call to get lab results if they haven't heard anything in the next week.   This note has been created with Surveyor, quantity. Any transcriptional errors are unintentional.   Maren Reamer, MD, [place] [facility] [place], [place]   04/03/2016, 2:45 PM

## 2016-04-03 NOTE — ED Notes (Signed)
Patient verbalizes understanding of blood administration benefits/risks and denies any questions; signed informed consent form at bedside.

## 2016-04-03 NOTE — ED Triage Notes (Signed)
Onset 2 days ago blisters on side of tongue, back of throat and gum area, burning with urination, and shortness of breath with wheezing. And right side facial swelling.  Pt has used inhaler x 2 today with no relief.  Pt sent here from [facility].  Was given breathing tx and Methyloprednisonolone 125 mg injection.  Meds seem to have improved breathing "little bit" per pt.

## 2016-04-03 NOTE — Telephone Encounter (Signed)
Patient came by the office to pick up her prescription but pharmacy informed her that her predniSONE (DELTASONE) 10 MG tablet and flexeril were not called in. Please resend.   Thank you.

## 2016-04-03 NOTE — ED Notes (Signed)
Patient returned from x-ray and placed back on monitor.

## 2016-04-03 NOTE — H&P (Signed)
History and Physical    Amy Hall PZW:[id number] DOB: October 04, 1965 DOA: 04/03/2016  Referring MD/NP/PA:   PCP: [name], MD   Patient coming from:  The patient is coming from home.  At baseline, patient is independent for most of ADL.  Chief Complaint: Dysuria, shortness breath, wheezing, cough, facial swelling and blister in mouth.  HPI: Amy Hall is a 51 y.o. female with medical history significant of RA on chronic prednisone, asthma, DDD, arthritis, tobacco abuse, hx of PNA treated with Levoquin in Jan 2018, who presents with dysuria, shortness breath, wheezing, cough, facial swelling and blister in mouth.  Patient reports that she has been having shortness rest, wheezing and a cough with yellow colored sputum production in the past to 7 days, which has worsened today. Patient does not have chest pain at rest, but has lower chest muscle pain due to severe coughing. Patient denies fever or chills. She reports dysuria, burning on urination and increased urinary frequency recently. Patient also reports that she noted blisters on side of tongue, back of throat and gum area two weeks ago. Also has right side facial swelling. Patient has nausea and vomited yesterday, but no abdominal pain or diarrhea. Patient is constipated recently. She did not note any blood in her stool. No hematuria. She did not have menstrual period for more than oen year. Pt was sent in [facility] wellness today. She was given breathing tx and Methyloprednisonolone 125 mg injection with little improvement of SOB, and was send to ED for further evaluation and treatment. Of note, pt is taking prednisone 10 mg twice a day chronically for rheumatoid arthritis and also take ibuprofen often.  ED Course: pt was found to have positive FOBT, hemoglobin dropped from 12.2 on 818/17-->5.7 today,  WBC 17.5, positive urinalysis with moderate amount of leukocytes,  potassium 3.3, creatinine normal,  temperature normal, tachycardia, saturation 93% on room air, flu PCR negative. Pt is admitted to SDU as inpt. Transfuse 2 units of blood. Started IV protonix gtt. Initially, no source of bleeding identified before FOBT results came back. Due to severe anemia and leukocytosis, without clear source of bleeding, hematologist, Dr Alvy Bimler was consulted.  Review of Systems:   General: no fevers, chills, no changes in body weight, has fatigue HEENT: no blurry vision, hearing changes or sore throat Respiratory: has dyspnea, coughing, wheezing CV: no chest pain, no palpitations GI: has nausea, vomiting, no abdominal pain, diarrhea, has constipation GU: has dysuria, burning on urination, increased urinary frequency, no hematuria  Ext: no leg edema Neuro: no unilateral weakness, numbness, or tingling, no vision change or hearing loss Skin: no rash, no skin tear. MSK: No muscle spasm, no deformity, no limitation of range of movement in spin Heme: No easy bruising.  Travel history: No recent long distant travel.  Allergy:  Allergies  Allergen Reactions  . Gabapentin     More pain     Past Medical History:  Diagnosis Date  . Arthritis   . Asthma   . DDD (degenerative disc disease), lumbar   . Osteoporosis of lumbar spine   . Rheumatoid arthritis ([place])   . Sleep disorder     Past Surgical History:  Procedure Laterality Date  . ELBOW SURGERY      Social History:  reports that she has been smoking Cigarettes.  She has been smoking about 1.00 pack per day. She has never used smokeless tobacco. She reports that she drinks about 0.6 - 1.2 oz of alcohol per  week . She reports that she does not use drugs.  Family History:  Family History  Problem Relation Age of Onset  . Anemia Mother   . Hypertension Father   . Heart disease Father   . Hypertension Sister      Prior to Admission medications   Medication Sig Start Date End Date Taking? Authorizing Provider  acetaminophen-codeine  (TYLENOL #3) 300-30 MG tablet Take 1 tablet by mouth every 12 (twelve) hours as needed for moderate pain. 04/03/16  Yes Dawn Lazarus Gowda, MD  albuterol (PROVENTIL HFA;VENTOLIN HFA) 108 (90 Base) MCG/ACT inhaler Inhale 2 puffs into the lungs every 6 (six) hours as needed for wheezing or shortness of breath. 03/20/16  Yes [name], MD  cetirizine (ZYRTEC) 10 MG tablet Take 1 tablet (10 mg total) by mouth daily. 03/12/16  Yes [name], MD  cyclobenzaprine (FLEXERIL) 5 MG tablet Take 1 tablet (5 mg total) by mouth 2 (two) times daily as needed for muscle spasms. 02/13/16  Yes [name], MD  ibuprofen (ADVIL,MOTRIN) 200 MG tablet Take 800 mg by mouth every 6 (six) hours as needed (swelling).   Yes Historical Provider, MD  predniSONE (DELTASONE) 10 MG tablet Take 1 tablet (10 mg total) by mouth 2 (two) times daily with a meal. 04/03/16  Yes Maren Reamer, MD  pregabalin (LYRICA) 75 MG capsule Take 1 capsule (75 mg total) by mouth 2 (two) times daily. Patient not taking: Reported on 03/12/2016 01/02/16   Trula Slade, DPM  sulfamethoxazole-trimethoprim (BACTRIM DS) 800-160 MG tablet Take 1 tablet by mouth 2 (two) times daily. Patient not taking: Reported on 04/03/2016 03/12/16   [name], MD    Physical Exam: Vitals:   04/04/16 0323 04/04/16 0331 04/04/16 0745 04/04/16 0748  BP: 132/85 132/85  133/88  Pulse: 82 80  88  Resp: (!) 21 15  20   Temp: 97.7 F (36.5 C) 97.8 F (36.6 C)  97.9 F (36.6 C)  TempSrc: Oral Oral  Axillary  SpO2: 97% 96% 95% 94%  Weight:      Height:       General: Not in acute distress HEENT:       Eyes: PERRL, EOMI, no scleral icterus.       ENT: No discharge from the ears and nose, no pharynx injection, no tonsillar enlargement.  has some blisters on the soft palate, tongue questionably slightly swollen, no lip swelling.        Neck: No JVD, no bruit, no mass felt. Heme: No neck lymph node enlargement. Cardiac: S1/S2, RRR, No murmurs, No gallops or  rubs. Respiratory: Has severely decreased air movement bilaterally, with mild wheezing bilaterally. No rhonchi or rubs. GI: Soft, nondistended, nontender, no rebound pain, no organomegaly, BS present. GU: No hematuria Ext: No pitting leg edema bilaterally. 2+DP/PT pulse bilaterally. Musculoskeletal: No joint deformities, No joint redness or warmth, no limitation of ROM in spin. Skin: No rashes.  Neuro: Alert, oriented X3, cranial nerves II-XII grossly intact, moves all extremities normally. Psych: Patient is not psychotic, no suicidal or hemocidal ideation.  Labs on Admission: I have personally reviewed following labs and imaging studies  CBC:  Recent Labs Lab 04/03/16 1624 04/04/16 0616  WBC 17.5* 13.8*  NEUTROABS 14.5*  --   HGB 5.7* 7.3*  HCT 20.7* 25.2*  MCV 73.9* 76.1*  PLT 589* 914*   Basic Metabolic Panel:  Recent Labs Lab 04/03/16 1624 04/04/16 0616  NA 140 137  K 3.3* 4.0  CL 105 105  CO2 26 24  GLUCOSE 117* 138*  BUN 10 6  CREATININE 0.68 0.56  CALCIUM 8.7* 8.1*  MG  --  1.8   GFR: Estimated Creatinine Clearance: 76.2 mL/min (by C-G formula based on SCr of 0.56 mg/dL). Liver Function Tests:  Recent Labs Lab 04/03/16 1624  AST 21  ALT 25  ALKPHOS 48  BILITOT 0.3  PROT 5.8*  ALBUMIN 2.7*    Recent Labs Lab 04/03/16 2058  LIPASE 26   No results for input(s): AMMONIA in the last 168 hours. Coagulation Profile:  Recent Labs Lab 04/03/16 2044  INR 1.02   Cardiac Enzymes: No results for input(s): CKTOTAL, CKMB, CKMBINDEX, TROPONINI in the last 168 hours. BNP (last 3 results) No results for input(s): PROBNP in the last 8760 hours. HbA1C: No results for input(s): HGBA1C in the last 72 hours. CBG: No results for input(s): GLUCAP in the last 168 hours. Lipid Profile: No results for input(s): CHOL, HDL, LDLCALC, TRIG, CHOLHDL, LDLDIRECT in the last 72 hours. Thyroid Function Tests: No results for input(s): TSH, T4TOTAL, FREET4, T3FREE,  THYROIDAB in the last 72 hours. Anemia Panel:  Recent Labs  04/03/16 2044  VITAMINB12 191  FERRITIN 6*  TIBC 315  IRON <5*  RETICCTPCT 3.0   Urine analysis:    Component Value Date/Time   COLORURINE YELLOW 04/03/2016 1636   APPEARANCEUR CLOUDY (A) 04/03/2016 1636   LABSPEC 1.009 04/03/2016 1636   PHURINE 7.0 04/03/2016 1636   GLUCOSEU NEGATIVE 04/03/2016 1636   HGBUR NEGATIVE 04/03/2016 1636   BILIRUBINUR NEGATIVE 04/03/2016 1636   BILIRUBINUR negative 04/03/2016 1446   KETONESUR NEGATIVE 04/03/2016 1636   PROTEINUR NEGATIVE 04/03/2016 1636   UROBILINOGEN 0.2 04/03/2016 1446   NITRITE NEGATIVE 04/03/2016 1636   LEUKOCYTESUR MODERATE (A) 04/03/2016 1636   Sepsis Labs: @LABRCNTIP (procalcitonin:4,lacticidven:4) ) Recent Results (from the past 240 hour(s))  Rapid strep screen (not at [facility])     Status: None   Collection Time: 04/03/16  5:01 PM  Result Value Ref Range Status   Streptococcus, Group A Screen (Direct) NEGATIVE NEGATIVE Final    Comment: (NOTE) A Rapid Antigen test may result negative if the antigen level in the sample is below the detection level of this test. The FDA has not cleared this test as a stand-alone test therefore the rapid antigen negative result has reflexed to a Group A Strep culture.   MRSA PCR Screening     Status: None   Collection Time: 04/03/16 10:01 PM  Result Value Ref Range Status   MRSA by PCR NEGATIVE NEGATIVE Final    Comment:        The GeneXpert MRSA Assay (FDA approved for NASAL specimens only), is one component of a comprehensive MRSA colonization surveillance program. It is not intended to diagnose MRSA infection nor to guide or monitor treatment for MRSA infections.      Radiological Exams on Admission: Dg Chest 2 View  Result Date: 04/03/2016 CLINICAL DATA:  Sore throat, unable to the eat, shortness of breath, wheezing, smoker EXAM: CHEST  2 VIEW COMPARISON:  None. FINDINGS: Diffuse bilateral reticulonodular  opacities throughout all lobes of both lungs, suspicious for chronic interstitial lung disease. No definite superimposed focal pneumonia, collapse or consolidation. Normal heart size vascularity. No effusion or pneumothorax. Hiatal hernia suspected. Compression fracture of the L1 superior endplate, remote and unchanged compared 08/29/2015. IMPRESSION: Diffuse reticulonodular pulmonary opacities throughout both lungs, suspect chronic interstitial lung disease. No superimposed CHF or pneumonia Hiatal hernia suspected L1 superior endplate compression fracture,  remote. Electronically Signed   By: Jerilynn Mages.  Shick M.D.   On: 04/03/2016 17:35     EKG: Independently reviewed.  Sinus rhythm, QTC 464, tachycardia, no ischemic change.  Assessment/Plan Principal Problem:   Symptomatic anemia Active Problems:   Rheumatoid arthritis (HCC)   Asthma exacerbation   Tobacco abuse   UTI (urinary tract infection)   Sepsis (HCC)   Allergic reaction   Severe anemia   Nausea & vomiting   GI bleed   Symptomatic anemia: Hemoglobin dropped from 12.2 on 09/02/15-->5.7. FOBT positive, likely due to GI bleeding. Patient isn't taking prednisone chronically and also on ibuprofen, which are the risk factors.  -Will admit to SDU as inpt - hold Ibuprofen -transfuse 2 units of blood - Start IV pantoprazole gtt - Zofran IV for nausea - Avoid NSAIDs and SQ heparin - Maintain IV access (2 large bore IVs if possible). - Monitor closely and follow q6h cbc, transfuse as necessary. - check LDH, haptoglobin and peripheral smear, anemia panel - place call GI in AM.  GI bleeding: -see above  Asthma exacerbation: Patient has severely decreased air movement bilaterally with wheezing, consistent with asthma exacerbation. Flu pcr negative -Urine S. pneumococcal antigen -Nebulizers: Scheduled Atrovent and prn Xopenex nebs -z-pak -Solu-Medrol 60 mg IV q8h  -Mucinex for cough  -Follow up blood culture x2, sputum  culture.  UTI: -rocephin  -f/u urine culture and blood culture  Sepsis: Patient meets criteria for sepsis with leukocytosis, tachycardia, tachypnea and elevated lactate. Likely due to UTI and asthma exacerbation. -on Abx as above -will get Procalcitonin and trend lactic acid levels per sepsis protocol. -IVF: 2L of NS bolus in ED, followed by 100 cc/h   Rheumatoid arthritis ([place]): stable. On chronic prednisone 10 mg twice a day. -Continue prednisone -Patient is started with IV Solu-Medrol for asthma exacerbation which will also serves as stress dose of steroid. -check cortisol level  Tobacco abuse: -Did counseling about importance of quitting smoking -Nicotine patch  Possible Allergic reaction: pt has some blisters on the soft palate, tongue questionably slightly swollen, no lip swelling.  etiology is not clear. Fast strep negative. -On Solu-Medrol as above - Pepcid 20 mg twice a day -Benadryl 25 mg twice a day  Hypokalemia: K=3.3 on admission. - Repleted - Check Mg level    DVT ppx: SCD Code Status: Full code Family Communication: None at bed side.   Disposition Plan:  Anticipate discharge back to previous home environment Consults called:  Oncology, Dr. Alvy Bimler Admission status: SDU/inpation       Date of Service 04/04/2016    Ivor Costa [facility] Pager [phone]  If 7PM-7AM, please contact night-coverage [email or web address] Password [email or web address] 04/04/2016, 8:00 AM

## 2016-04-03 NOTE — ED Notes (Signed)
MD at bedside. 

## 2016-04-03 NOTE — ED Notes (Signed)
Patient transported to x-ray. ?

## 2016-04-03 NOTE — Progress Notes (Signed)
Peak Flow 180/130

## 2016-04-03 NOTE — Progress Notes (Signed)
Received consult I reviewed her chart I will see her tomorrow am Overall not consistent with leukemia I agreed with transfusion support tonight

## 2016-04-04 ENCOUNTER — Encounter (HOSPITAL_COMMUNITY): Payer: Self-pay | Admitting: Internal Medicine

## 2016-04-04 DIAGNOSIS — D509 Iron deficiency anemia, unspecified: Secondary | ICD-10-CM

## 2016-04-04 DIAGNOSIS — R0602 Shortness of breath: Secondary | ICD-10-CM

## 2016-04-04 DIAGNOSIS — K2970 Gastritis, unspecified, without bleeding: Secondary | ICD-10-CM

## 2016-04-04 DIAGNOSIS — E538 Deficiency of other specified B group vitamins: Secondary | ICD-10-CM

## 2016-04-04 DIAGNOSIS — M069 Rheumatoid arthritis, unspecified: Secondary | ICD-10-CM

## 2016-04-04 DIAGNOSIS — D473 Essential (hemorrhagic) thrombocythemia: Secondary | ICD-10-CM

## 2016-04-04 DIAGNOSIS — Z720 Tobacco use: Secondary | ICD-10-CM

## 2016-04-04 DIAGNOSIS — K922 Gastrointestinal hemorrhage, unspecified: Secondary | ICD-10-CM | POA: Diagnosis present

## 2016-04-04 DIAGNOSIS — D649 Anemia, unspecified: Secondary | ICD-10-CM

## 2016-04-04 DIAGNOSIS — D72829 Elevated white blood cell count, unspecified: Secondary | ICD-10-CM

## 2016-04-04 LAB — CBC
HEMATOCRIT: 25.2 % — AB (ref 36.0–46.0)
HEMATOCRIT: 24.6 % — AB (ref 36.0–46.0)
HEMATOCRIT: 25.2 % — AB (ref 36.0–46.0)
HEMOGLOBIN: 7.3 g/dL — AB (ref 12.0–15.0)
HEMOGLOBIN: 7 g/dL — AB (ref 12.0–15.0)
HEMOGLOBIN: 7.5 g/dL — AB (ref 12.0–15.0)
MCH: 22.1 pg — AB (ref 26.0–34.0)
MCH: 21.6 pg — AB (ref 26.0–34.0)
MCH: 22.6 pg — AB (ref 26.0–34.0)
MCHC: 29 g/dL — ABNORMAL LOW (ref 30.0–36.0)
MCHC: 28.5 g/dL — ABNORMAL LOW (ref 30.0–36.0)
MCHC: 29.8 g/dL — AB (ref 30.0–36.0)
MCV: 76.1 fL — AB (ref 78.0–100.0)
MCV: 75.9 fL — ABNORMAL LOW (ref 78.0–100.0)
MCV: 75.9 fL — ABNORMAL LOW (ref 78.0–100.0)
Platelets: 457 10*3/uL — ABNORMAL HIGH (ref 150–400)
Platelets: 466 10*3/uL — ABNORMAL HIGH (ref 150–400)
Platelets: 470 10*3/uL — ABNORMAL HIGH (ref 150–400)
RBC: 3.31 MIL/uL — AB (ref 3.87–5.11)
RBC: 3.24 MIL/uL — AB (ref 3.87–5.11)
RBC: 3.32 MIL/uL — ABNORMAL LOW (ref 3.87–5.11)
RDW: 16.6 % — ABNORMAL HIGH (ref 11.5–15.5)
RDW: 16.6 % — ABNORMAL HIGH (ref 11.5–15.5)
RDW: 17 % — ABNORMAL HIGH (ref 11.5–15.5)
WBC: 13.8 10*3/uL — ABNORMAL HIGH (ref 4.0–10.5)
WBC: 15.3 10*3/uL — ABNORMAL HIGH (ref 4.0–10.5)
WBC: 18.9 10*3/uL — ABNORMAL HIGH (ref 4.0–10.5)

## 2016-04-04 LAB — LACTIC ACID, PLASMA
Lactic Acid, Venous: 1.9 mmol/L (ref 0.5–1.9)
Lactic Acid, Venous: 1.7 mmol/L (ref 0.5–1.9)

## 2016-04-04 LAB — GLUCOSE, CAPILLARY: Glucose-Capillary: 129 mg/dL — ABNORMAL HIGH (ref 65–99)

## 2016-04-04 LAB — INFLUENZA PANEL BY PCR (TYPE A & B)
INFLAPCR: NEGATIVE
Influenza B By PCR: NEGATIVE

## 2016-04-04 LAB — BASIC METABOLIC PANEL
Anion gap: 8 (ref 5–15)
BUN: 6 mg/dL (ref 6–20)
CALCIUM: 8.1 mg/dL — AB (ref 8.9–10.3)
CHLORIDE: 105 mmol/L (ref 101–111)
CO2: 24 mmol/L (ref 22–32)
Creatinine, Ser: 0.56 mg/dL (ref 0.44–1.00)
GFR calc non Af Amer: >60 mL/min (ref >60)
Glucose, Bld: 138 mg/dL — ABNORMAL HIGH (ref 65–99)
POTASSIUM: 4 mmol/L (ref 3.5–5.1)
Sodium: 137 mmol/L (ref 135–145)

## 2016-04-04 LAB — URINE CULTURE

## 2016-04-04 LAB — BLOOD CULTURE ID PANEL (REFLEXED)
ACINETOBACTER BAUMANNII: NOT DETECTED
CANDIDA KRUSEI: NOT DETECTED
CANDIDA PARAPSILOSIS: NOT DETECTED
Candida albicans: NOT DETECTED
Candida glabrata: NOT DETECTED
Candida tropicalis: NOT DETECTED
ESCHERICHIA COLI: NOT DETECTED
Enterobacter cloacae complex: NOT DETECTED
Enterobacteriaceae species: NOT DETECTED
Enterococcus species: NOT DETECTED
Haemophilus influenzae: NOT DETECTED
KLEBSIELLA OXYTOCA: NOT DETECTED
Klebsiella pneumoniae: NOT DETECTED
Listeria monocytogenes: NOT DETECTED
Methicillin resistance: DETECTED — AB
Neisseria meningitidis: NOT DETECTED
PSEUDOMONAS AERUGINOSA: NOT DETECTED
Proteus species: NOT DETECTED
SERRATIA MARCESCENS: NOT DETECTED
STAPHYLOCOCCUS AUREUS BCID: NOT DETECTED
STREPTOCOCCUS PNEUMONIAE: NOT DETECTED
STREPTOCOCCUS PYOGENES: NOT DETECTED
Staphylococcus species: DETECTED — AB
Streptococcus agalactiae: NOT DETECTED
Streptococcus species: NOT DETECTED

## 2016-04-04 LAB — MRSA PCR SCREENING: MRSA by PCR: NEGATIVE

## 2016-04-04 LAB — URINE CYTOLOGY ANCILLARY ONLY
Chlamydia: NEGATIVE
NEISSERIA GONORRHEA: NEGATIVE
TRICH (WINDOWPATH): POSITIVE — AB

## 2016-04-04 LAB — CORTISOL-AM, BLOOD: Cortisol - AM: 1.7 ug/dL — ABNORMAL LOW (ref 6.7–22.6)

## 2016-04-04 LAB — PATHOLOGIST SMEAR REVIEW

## 2016-04-04 LAB — STREP PNEUMONIAE URINARY ANTIGEN: STREP PNEUMO URINARY ANTIGEN: NEGATIVE

## 2016-04-04 LAB — MAGNESIUM: MAGNESIUM: 1.8 mg/dL (ref 1.7–2.4)

## 2016-04-04 MED ORDER — LEVALBUTEROL HCL 1.25 MG/0.5ML IN NEBU
1.2500 mg | INHALATION_SOLUTION | Freq: Three times a day (TID) | RESPIRATORY_TRACT | Status: DC
Start: 2016-04-04 — End: 2016-04-04
  Administered 2016-04-04 (×2): 1.25 mg via RESPIRATORY_TRACT
  Filled 2016-04-04 (×3): qty 0.5

## 2016-04-04 MED ORDER — MAGIC MOUTHWASH W/LIDOCAINE
5.0000 mL | Freq: Three times a day (TID) | ORAL | Status: DC | PRN
  Administered 2016-04-04 – 2016-04-05 (×2): 5 mL via ORAL
  Filled 2016-04-04 (×3): qty 5

## 2016-04-04 MED ORDER — METHYLPREDNISOLONE SODIUM SUCC 125 MG IJ SOLR
60.0000 mg | Freq: Two times a day (BID) | INTRAMUSCULAR | Status: DC
  Administered 2016-04-04: 60 mg via INTRAVENOUS
  Filled 2016-04-04 (×2): qty 2

## 2016-04-04 MED ORDER — BISACODYL 5 MG PO TBEC
5.0000 mg | DELAYED_RELEASE_TABLET | Freq: Once | ORAL | Status: AC
  Administered 2016-04-04: 5 mg via ORAL
  Filled 2016-04-04: qty 1

## 2016-04-04 MED ORDER — IPRATROPIUM-ALBUTEROL 0.5-2.5 (3) MG/3ML IN SOLN: 3.0000 mL | RESPIRATORY_TRACT | Status: DC | PRN

## 2016-04-04 MED ORDER — FOLIC ACID 1 MG PO TABS
1.0000 mg | ORAL_TABLET | Freq: Every day | ORAL | Status: DC
  Administered 2016-04-04 – 2016-04-06 (×2): 1 mg via ORAL
  Filled 2016-04-04 (×3): qty 1

## 2016-04-04 MED ORDER — SODIUM CHLORIDE 0.9 % IV SOLN
510.0000 mg | Freq: Once | INTRAVENOUS | Status: AC
  Administered 2016-04-04: 510 mg via INTRAVENOUS
  Filled 2016-04-04 (×2): qty 17

## 2016-04-04 MED ORDER — SENNOSIDES-DOCUSATE SODIUM 8.6-50 MG PO TABS: 1.0000 | ORAL_TABLET | Freq: Every evening | ORAL | Status: DC | PRN

## 2016-04-04 MED ORDER — SODIUM CHLORIDE 0.9 % IV SOLN
8.0000 mg/h | INTRAVENOUS | Status: DC
  Administered 2016-04-04 – 2016-04-05 (×2): 8 mg/h via INTRAVENOUS
  Filled 2016-04-04 (×6): qty 80

## 2016-04-04 MED ORDER — IPRATROPIUM BROMIDE 0.02 % IN SOLN
0.5000 mg | Freq: Three times a day (TID) | RESPIRATORY_TRACT | Status: DC
  Administered 2016-04-04 (×2): 0.5 mg via RESPIRATORY_TRACT
  Filled 2016-04-04 (×3): qty 2.5

## 2016-04-04 MED ORDER — CYANOCOBALAMIN 1000 MCG/ML IJ SOLN
1000.0000 ug | Freq: Every day | INTRAMUSCULAR | Status: DC
  Filled 2016-04-04: qty 1

## 2016-04-04 MED ORDER — WHITE PETROLATUM GEL
Status: AC
  Administered 2016-04-04: 11:00:00
  Filled 2016-04-04: qty 1

## 2016-04-04 MED ORDER — PEG 3350-KCL-NA BICARB-NACL 420 G PO SOLR
4000.0000 mL | Freq: Once | ORAL | Status: DC
  Filled 2016-04-04: qty 4000

## 2016-04-04 MED ORDER — SODIUM CHLORIDE 0.9 % IV SOLN
80.0000 mg | Freq: Once | INTRAVENOUS | Status: AC
  Administered 2016-04-04: 80 mg via INTRAVENOUS
  Filled 2016-04-04: qty 80

## 2016-04-04 MED ORDER — CYCLOBENZAPRINE HCL 5 MG PO TABS: 5.0000 mg | ORAL_TABLET | Freq: Two times a day (BID) | ORAL | 2 refills | Status: DC | PRN

## 2016-04-04 MED ORDER — CYANOCOBALAMIN 1000 MCG/ML IJ SOLN
1000.0000 ug | Freq: Every day | INTRAMUSCULAR | Status: DC
  Administered 2016-04-04 – 2016-04-06 (×2): 1000 ug via SUBCUTANEOUS
  Filled 2016-04-04 (×3): qty 1

## 2016-04-04 MED FILL — ?PREDNISONE 10 MG TABLET: 10 | 30 days supply | Qty: 60 | Fill #0

## 2016-04-04 MED FILL — CYCLOBENZAPRINE 5 MG TABLET: 5 | 30 days supply | Qty: 60 | Fill #0

## 2016-04-04 NOTE — Telephone Encounter (Signed)
I just renewed her flexeril. The prednisone was renewed in clinic yesterday, and she was given a paper prescription - could not be escribed due to length of directions.  She also had tylenol #3 prescription filled out as well - but did not pick up when left.  - she needs to fill out pain contract when picks up tylenol #3 rx.

## 2016-04-04 NOTE — Consult Note (Signed)
[facility] Gastroenterology Consultation Note  Referring Provider: Dr. Joette Catching Primary Care Physician:  [name], MD  Reason for Consultation:  Iron-defiency anemia  HPI: Amy Hall is a 51 y.o. female admitted with constellation of symptoms worrisome for allergic reaction.  As part of this evaluation, found to have iron-deficiency anemia, and thus GI was consulted.  She has few-day history of sores in her mouth and odynophagia and ill-defined periumbilical and lower abdominal discomfort (near constant, but worse after eating).  No overt blood in stool.  Consumes lots of NSAIDs.  No prior endoscopy or colonoscopy.  On prednisone chronically for rheumatoid arthritis.   Past Medical History:  Diagnosis Date  . Arthritis   . Asthma   . DDD (degenerative disc disease), lumbar   . Osteoporosis of lumbar spine   . Rheumatoid arthritis ([place])   . Sleep disorder     Past Surgical History:  Procedure Laterality Date  . ELBOW SURGERY      Prior to Admission medications   Medication Sig Start Date End Date Taking? Authorizing Provider  acetaminophen-codeine (TYLENOL #3) 300-30 MG tablet Take 1 tablet by mouth every 12 (twelve) hours as needed for moderate pain. 04/03/16  Yes Dawn Lazarus Gowda, MD  albuterol (PROVENTIL HFA;VENTOLIN HFA) 108 (90 Base) MCG/ACT inhaler Inhale 2 puffs into the lungs every 6 (six) hours as needed for wheezing or shortness of breath. 03/20/16  Yes [name], MD  cetirizine (ZYRTEC) 10 MG tablet Take 1 tablet (10 mg total) by mouth daily. 03/12/16  Yes [name], MD  ibuprofen (ADVIL,MOTRIN) 200 MG tablet Take 800 mg by mouth every 6 (six) hours as needed (swelling).   Yes Historical Provider, MD  predniSONE (DELTASONE) 10 MG tablet Take 1 tablet (10 mg total) by mouth 2 (two) times daily with a meal. 04/03/16  Yes Maren Reamer, MD  cyclobenzaprine (FLEXERIL) 5 MG tablet Take 1 tablet (5 mg total) by mouth 2 (two) times daily as needed for muscle spasms.  04/04/16   Maren Reamer, MD  pregabalin (LYRICA) 75 MG capsule Take 1 capsule (75 mg total) by mouth 2 (two) times daily. Patient not taking: Reported on 03/12/2016 01/02/16   Trula Slade, DPM  sulfamethoxazole-trimethoprim (BACTRIM DS) 800-160 MG tablet Take 1 tablet by mouth 2 (two) times daily. Patient not taking: Reported on 04/03/2016 03/12/16   [name], MD    Current Facility-Administered Medications  Medication Dose Route Frequency Provider Last Rate Last Dose  . 0.9 %  sodium chloride infusion   Intravenous Continuous Fredia Sorrow, MD 100 mL/hr at 04/03/16 1655    . acetaminophen (TYLENOL) tablet 650 mg  650 mg Oral Q6H PRN Ivor Costa, MD       Or  . acetaminophen (TYLENOL) suppository 650 mg  650 mg Rectal Q6H PRN Ivor Costa, MD      . cefTRIAXone (ROCEPHIN) 1 g in dextrose 5 % 50 mL IVPB  1 g Intravenous Q24H Ivor Costa, MD   1 g at 04/04/16 0017  . cyanocobalamin ((VITAMIN B-12)) injection 1,000 mcg  1,000 mcg Subcutaneous Daily Cherene Altes, MD   1,000 mcg at 04/04/16 1025  . cyclobenzaprine (FLEXERIL) tablet 5 mg  5 mg Oral BID PRN Ivor Costa, MD   5 mg at 04/04/16 0803  . dextromethorphan-guaiFENesin (MUCINEX DM) 30-600 MG per 12 hr tablet 1 tablet  1 tablet Oral BID Ivor Costa, MD   1 tablet at 04/04/16 1024  . diphenhydrAMINE (BENADRYL) capsule 25 mg  25 mg Oral BID Ivor Costa, MD   25 mg at 04/04/16 0800  . famotidine (PEPCID) tablet 20 mg  20 mg Oral BID Ivor Costa, MD   20 mg at 04/04/16 0800  . folic acid (FOLVITE) tablet 1 mg  1 mg Oral Daily Heath Lark, MD   1 mg at 04/04/16 1024  . ipratropium (ATROVENT) nebulizer solution 0.5 mg  0.5 mg Nebulization TID Ivor Costa, MD   0.5 mg at 04/04/16 0743  . levalbuterol (XOPENEX) nebulizer solution 1.25 mg  1.25 mg Nebulization TID Ivor Costa, MD   1.25 mg at 04/04/16 0743  . loratadine (CLARITIN) tablet 10 mg  10 mg Oral Daily Ivor Costa, MD   10 mg at 04/04/16 1024  . magic mouthwash w/lidocaine  5 mL Oral TID PRN  Cherene Altes, MD      . methylPREDNISolone sodium succinate (SOLU-MEDROL) 125 mg/2 mL injection 60 mg  60 mg Intravenous TID Ivor Costa, MD   60 mg at 04/04/16 1024  . nicotine (NICODERM CQ - dosed in mg/24 hours) patch 21 mg  21 mg Transdermal Daily Ivor Costa, MD   21 mg at 04/04/16 1025  . ondansetron (ZOFRAN) injection 4 mg  4 mg Intravenous Q8H PRN Ivor Costa, MD   4 mg at 04/03/16 2113  . pantoprazole (PROTONIX) 80 mg in sodium chloride 0.9 % 250 mL (0.32 mg/mL) infusion  8 mg/hr Intravenous Continuous Ivor Costa, MD 25 mL/hr at 04/04/16 0800 8 mg/hr at 04/04/16 0800  . predniSONE (DELTASONE) tablet 10 mg  10 mg Oral BID WC Ivor Costa, MD   10 mg at 04/04/16 0800  . senna-docusate (Senokot-S) tablet 1 tablet  1 tablet Oral QHS PRN Ivor Costa, MD      . sodium chloride flush (NS) 0.9 % injection 3 mL  3 mL Intravenous Q12H Ivor Costa, MD   3 mL at 04/04/16 1000  . zolpidem (AMBIEN) tablet 5 mg  5 mg Oral QHS PRN Ivor Costa, MD        Allergies as of 04/03/2016 - Review Complete 04/03/2016  Allergen Reaction Noted  . Gabapentin  11/04/2015    Family History  Problem Relation Age of Onset  . Anemia Mother   . Hypertension Father   . Heart disease Father   . Hypertension Sister     Social History   Social History  . Marital status: Divorced    Spouse name: N/A  . Number of children: N/A  . Years of education: N/A   Occupational History  . Not on file.   Social History Main Topics  . Smoking status: Current Every Day Smoker    Packs/day: 1.00    Types: Cigarettes  . Smokeless tobacco: Never Used  . Alcohol use 0.6 - 1.2 oz/week    1 - 2 Cans of beer per week     Comment: occ  . Drug use: No  . Sexual activity: Not on file   Other Topics Concern  . Not on file   Social History Narrative  . No narrative on file    Review of Systems: ROS Dr. Blaine Hamper 04/03/16 reviewed and I agree  Physical Exam: Vital signs in last 24 hours: Temp:  [97.5 F (36.4 C)-98.5 F (36.9 C)]  97.5 F (36.4 C) (03/21 1326) Pulse Rate:  [80-124] 86 (03/21 1326) Resp:  [11-28] 20 (03/21 0800) BP: (101-133)/(63-99) 123/81 (03/21 1326) SpO2:  [93 %-100 %] 94 % (03/21 1326) Weight:  [65.9 kg (145  lb 3.2 oz)-71.6 kg (157 lb 14.4 oz)] 71.6 kg (157 lb 14.4 oz) (03/20 2208) Last BM Date: 04/03/16 General:   Alert,  Older-appearing than stated age, NAD Head:  Normocephalic and atraumatic. Eyes:  Sclera clear, no icterus.   Conjunctiva pale Ears:  Normal auditory acuity. Nose:  No deformity, discharge,  or lesions. Mouth:  I did not appreciate any significant aphthous ulcers in mouth; No deformity or lesions.  Oropharynx pale and dry Neck:  Supple; no masses or thyromegaly. Lungs:  Clear throughout to auscultation.   No wheezes, crackles, or rhonchi. No acute distress. Heart:  Regular rate and rhythm; no murmurs, clicks, rubs,  or gallops. Abdomen:  Soft,non-distended, generalized abdominal tenderness without peritonitis. No masses, hepatosplenomegaly or hernias noted. Normal bowel sounds, without guarding, and without rebound.     Msk:  Symmetrical without gross deformities. Normal posture. Pulses:  Normal pulses noted. Extremities:  Without clubbing or edema. Neurologic:  Alert and  oriented x4; diffusely weak, otherwise grossly normal neurologically. Skin:  Intact without significant lesions or rashes. Psych:  Alert and cooperative. Normal mood and affect.   Lab Results:  Recent Labs  04/03/16 1624 04/04/16 0616 04/04/16 0951  WBC 17.5* 13.8* 15.3*  HGB 5.7* 7.3* 7.0*  HCT 20.7* 25.2* 24.6*  PLT 589* 457* 466*   BMET  Recent Labs  04/03/16 1624 04/04/16 0616  NA 140 137  K 3.3* 4.0  CL 105 105  CO2 26 24  GLUCOSE 117* 138*  BUN 10 6  CREATININE 0.68 0.56  CALCIUM 8.7* 8.1*   LFT  Recent Labs  04/03/16 1624  PROT 5.8*  ALBUMIN 2.7*  AST 21  ALT 25  ALKPHOS 48  BILITOT 0.3   PT/INR  Recent Labs  04/03/16 2044  LABPROT 13.5  INR 1.02     Studies/Results: Dg Chest 2 View  Result Date: 04/03/2016 CLINICAL DATA:  Sore throat, unable to the eat, shortness of breath, wheezing, smoker EXAM: CHEST  2 VIEW COMPARISON:  None. FINDINGS: Diffuse bilateral reticulonodular opacities throughout all lobes of both lungs, suspicious for chronic interstitial lung disease. No definite superimposed focal pneumonia, collapse or consolidation. Normal heart size vascularity. No effusion or pneumothorax. Hiatal hernia suspected. Compression fracture of the L1 superior endplate, remote and unchanged compared 08/29/2015. IMPRESSION: Diffuse reticulonodular pulmonary opacities throughout both lungs, suspect chronic interstitial lung disease. No superimposed CHF or pneumonia Hiatal hernia suspected L1 superior endplate compression fracture, remote. Electronically Signed   By: Jerilynn Mages.  Shick M.D.   On: 04/03/2016 17:35   Impression:  1.  Generalized abdominal pain. 2.  Odynophagia. 3.  Iron-deficiency anemia.  Plan:  1.  Endoscopy and colonoscopy for further evaluation (if she can't tolerate the bowel prep, then would do endoscopy alone). 2.  No ASA/NsAIDs. 3.  PPI. 4.  Clear liquids, NPO after midnight. 5.  Next step in management is pending endoscopy and colonoscopy findings. 6.  [facility] will follow.   LOS: 1 day   Shenica Holzheimer Jerilynn Mages  04/04/2016, 1:27 PM  Pager [phone] If no answer or after 5 PM call [phone]

## 2016-04-04 NOTE — Consult Note (Signed)
[place] CONSULT NOTE  Patient Care Team: [name], MD as PCP - General (Family Medicine)  CHIEF COMPLAINTS/PURPOSE OF CONSULTATION: Leukocytosis, thrombocytosis, microcytic anemia  HISTORY OF PRESENTING ILLNESS:  Amy Hall 51 y.o. female is admitted to the hospital due to allergic reaction with mouth and tongue swelling.  She has background history of rheumatoid arthritis, diagnosed for almost 6 years. She is disabled from rheumatoid arthritis. She has not seen a rheumatologist for this. She was managed on prednisone 10 mg twice a day for the past few years. She has been complaining of severe epigastric discomfort and severe heartburn on a regular basis. She managed it by drinking lots of milk and taking on Tums.  Most recently, she has been to see her primary care physician for symptoms of bronchitis/community-acquired pneumonia. The patient is a smoker. On 02/20/2016, she was prescribed levofloxacin along with inhalers and Mucinex. On 03/12/2016, she presented to her primary care physician with symptoms of acute cystitis. This time, she was prescribed double strength Bactrim twice a day for 10 days. She completed her antibiotic therapy approximately a week to 10 days ago. Yesterday, she presented to the clinic with symptoms of shortness of breath . She also complained of dysuria with associated vaginal itching. She was treated for possible acute allergic reaction and then subsequently transferred to the emergency department for further management. She complained of significant mouth sores recently and having trouble with eating, swallowing and her breathing was difficult. Since acute management for possible allergic reaction, her symptoms of breathing has improved. She was noted to have significant abnormal CBC yesterday. I am consulted regarding that. Her baseline hemoglobin on 09/02/2015 show white count of 12.1, hemoglobin 12.2 and platelet count  387,000. Yesterday, her white count was 17.5, hemoglobin was 5.7, MCV of 73.9 and platelet count of 589,000. Iron panel review profile iron deficiency anemia. Serum vitamin B-12 is also low.  She denies recent pre-syncopal episodes, or palpitations. She has symptoms of severe epigastric/retrosternal chest pain for some time She had not noticed any recent bleeding such as epistaxis, hematuria or hematochezia She is not on antiplatelets agents. She has never had colonoscopy or EGD She denies any pica and eats a variety of diet. She never donated blood or received blood transfusion The patient has no menstruation for over a year The patient have chronic constipation and usually only has bowel movement once a week She denies recent weight loss  MEDICAL HISTORY:  Past Medical History:  Diagnosis Date  . Arthritis   . Asthma   . DDD (degenerative disc disease), lumbar   . Osteoporosis of lumbar spine   . Rheumatoid arthritis ([place])   . Sleep disorder     SURGICAL HISTORY: Past Surgical History:  Procedure Laterality Date  . ELBOW SURGERY      SOCIAL HISTORY: Social History   Social History  . Marital status: Divorced    Spouse name: N/A  . Number of children: N/A  . Years of education: N/A   Occupational History  . Not on file.   Social History Main Topics  . Smoking status: Current Every Day Smoker    Packs/day: 1.00    Types: Cigarettes  . Smokeless tobacco: Never Used  . Alcohol use 0.6 - 1.2 oz/week    1 - 2 Cans of beer per week     Comment: occ  . Drug use: No  . Sexual activity: Not on file   Other Topics Concern  . Not on  file   Social History Narrative  . No narrative on file    FAMILY HISTORY: Family History  Problem Relation Age of Onset  . Anemia Mother   . Hypertension Father   . Heart disease Father   . Hypertension Sister     ALLERGIES:  is allergic to gabapentin.  MEDICATIONS:  Current Facility-Administered Medications  Medication Dose  Route Frequency Provider Last Rate Last Dose  . 0.9 %  sodium chloride infusion   Intravenous Continuous Fredia Sorrow, MD 100 mL/hr at 04/03/16 1655    . acetaminophen (TYLENOL) tablet 650 mg  650 mg Oral Q6H PRN Ivor Costa, MD       Or  . acetaminophen (TYLENOL) suppository 650 mg  650 mg Rectal Q6H PRN Ivor Costa, MD      . azithromycin [facility]) tablet 250 mg  250 mg Oral Q24H Ivor Costa, MD      . cefTRIAXone (ROCEPHIN) 1 g in dextrose 5 % 50 mL IVPB  1 g Intravenous Q24H Ivor Costa, MD   1 g at 04/04/16 0017  . cyanocobalamin ((VITAMIN B-12)) injection 1,000 mcg  1,000 mcg Intramuscular Daily Kashton Mcartor, MD      . cyclobenzaprine (FLEXERIL) tablet 5 mg  5 mg Oral BID PRN Ivor Costa, MD   5 mg at 04/03/16 2224  . dextromethorphan-guaiFENesin (MUCINEX DM) 30-600 MG per 12 hr tablet 1 tablet  1 tablet Oral BID Ivor Costa, MD   1 tablet at 04/03/16 2224  . diphenhydrAMINE (BENADRYL) capsule 25 mg  25 mg Oral BID Ivor Costa, MD      . famotidine (PEPCID) tablet 20 mg  20 mg Oral BID Ivor Costa, MD      . ferumoxytol [facility]) 510 mg in sodium chloride 0.9 % 100 mL IVPB  510 mg Intravenous Once Heath Lark, MD      . folic acid (FOLVITE) tablet 1 mg  1 mg Oral Daily Ludwig Tugwell, MD      . ipratropium (ATROVENT) nebulizer solution 0.5 mg  0.5 mg Nebulization TID Ivor Costa, MD      . levalbuterol [facility]) nebulizer solution 1.25 mg  1.25 mg Nebulization TID Ivor Costa, MD      . loratadine (CLARITIN) tablet 10 mg  10 mg Oral Daily Ivor Costa, MD      . methylPREDNISolone sodium succinate (SOLU-MEDROL) 125 mg/2 mL injection 60 mg  60 mg Intravenous TID Ivor Costa, MD   60 mg at 04/04/16 0020  . nicotine (NICODERM CQ - dosed in mg/24 hours) patch 21 mg  21 mg Transdermal Daily Ivor Costa, MD   21 mg at 04/04/16 0023  . ondansetron (ZOFRAN) injection 4 mg  4 mg Intravenous Q8H PRN Ivor Costa, MD   4 mg at 04/03/16 2113  . pantoprazole (PROTONIX) 80 mg in sodium chloride 0.9 % 100 mL IVPB  80 mg Intravenous  Once Ivor Costa, MD   80 mg at 04/04/16 0600  . pantoprazole (PROTONIX) 80 mg in sodium chloride 0.9 % 250 mL (0.32 mg/mL) infusion  8 mg/hr Intravenous Continuous Ivor Costa, MD 25 mL/hr at 04/04/16 0600 8 mg/hr at 04/04/16 0600  . predniSONE (DELTASONE) tablet 10 mg  10 mg Oral BID WC Ivor Costa, MD      . sodium chloride flush (NS) 0.9 % injection 3 mL  3 mL Intravenous Q12H Ivor Costa, MD   3 mL at 04/03/16 2229  . zolpidem (AMBIEN) tablet 5 mg  5 mg Oral QHS PRN Soledad Gerlach  Blaine Hamper, MD        REVIEW OF SYSTEMS:   Constitutional: Denies fevers, chills or abnormal night sweats Eyes: Denies blurriness of vision, double vision or watery eyes Ears, nose, mouth, throat, and face: Denies mucositis or sore throat Skin: Denies abnormal skin rashes Lymphatics: Denies new lymphadenopathy or easy bruising Neurological:Denies numbness, tingling or new weaknesses Behavioral/Psych: Mood is stable, no new changes  All other systems were reviewed with the patient and are negative.  PHYSICAL EXAMINATION: ECOG PERFORMANCE STATUS: 1 - Symptomatic but completely ambulatory  Vitals:   04/04/16 0323 04/04/16 0331  BP: 132/85 132/85  Pulse: 82 80  Resp: (!) 21 15  Temp: 97.7 F (36.5 C) 97.8 F (36.6 C)   Filed Weights   04/03/16 2208  Weight: 157 lb 14.4 oz (71.6 kg)    GENERAL:alert, no distress and comfortable. She looks mildly cushingoid SKIN: She has evidence of tanning on her skin. The patient admits she use some tanning bed EYES: normal, conjunctiva are pink and non-injected, sclera clear OROPHARYNX:no exudate, no erythema and lips, buccal mucosa, and tongue normal  NECK: supple, thyroid normal size, non-tender, without nodularity LYMPH:  no palpable lymphadenopathy in the cervical, axillary or inguinal LUNGS: clear to auscultation and percussion with normal breathing effort HEART: regular rate & rhythm and no murmurs and no lower extremity edema ABDOMEN: Her abdomen is diffusely tender especially in  the epigastric region, without rebound or guarding  Musculoskeletal:no cyanosis of digits and no clubbing  PSYCH: alert & oriented x 3 with fluent speech NEURO: no focal motor/sensory deficits  RADIOGRAPHIC STUDIES: I have personally reviewed the radiological images as listed and agreed with the findings in the report. Dg Chest 2 View  Result Date: 04/03/2016 CLINICAL DATA:  Sore throat, unable to the eat, shortness of breath, wheezing, smoker EXAM: CHEST  2 VIEW COMPARISON:  None. FINDINGS: Diffuse bilateral reticulonodular opacities throughout all lobes of both lungs, suspicious for chronic interstitial lung disease. No definite superimposed focal pneumonia, collapse or consolidation. Normal heart size vascularity. No effusion or pneumothorax. Hiatal hernia suspected. Compression fracture of the L1 superior endplate, remote and unchanged compared 08/29/2015. IMPRESSION: Diffuse reticulonodular pulmonary opacities throughout both lungs, suspect chronic interstitial lung disease. No superimposed CHF or pneumonia Hiatal hernia suspected L1 superior endplate compression fracture, remote. Electronically Signed   By: Jerilynn Mages.  Shick M.D.   On: 04/03/2016 17:35   I have reviewed her peripheral blood smear. She has hypersegmented neutrophils. Significant amount of platelet count is noted. She has hypochromic microcytic anemia. No schistocytes ASSESSMENT & PLAN:   Leukocytosis, acute on chronic This is likely due to chronic prednisone therapy. Acute elevated white blood cell count yesterday could be related to high-dose Solu-Medrol given. Monitor closely  Microcytic anemia with profound iron deficiency I suspect she may have chronic GI bleed, likely due to peptic ulcer disease from chronic prednisone therapy She has received 2 units of blood transfusion last night on 04/03/2016 I recommend GI consult for both EGD and colonoscopy evaluation She would need to be placed on high-dose antiacid medicine The most  likely cause of her anemia is due to chronic blood loss/malabsorption syndrome. We discussed some of the risks, benefits, and alternatives of intravenous iron infusions. The patient is symptomatic from anemia and the iron level is critically low. She tolerated oral iron supplement poorly and desires to achieved higher levels of iron faster for adequate hematopoesis. Some of the side-effects to be expected including risks of infusion reactions, phlebitis, headaches, nausea  and fatigue.  The patient is willing to proceed. Patient education material was dispensed.  Goal is to keep ferritin level greater than 50 I will give her 1 dose of intravenous iron today and possibly another dose next week or as outpatient  Reactive thrombocytosis This is due to iron deficiency Monitor closely  Vitamin B-12 deficiency Could be related to mild absorption from chronic gastritis/pernicious anemia I will add vitamin B 12 injection daily for 1 week to assist in effective erythropoiesis I would also start her on concurrent folic acid supplementation  Gastritis/abdominal pain I recommend GI consult for EGD and colonoscopy  Rheumatoid arthritis, chronic prednisone dependency Apparently, rheumatology consult is pending from outpatient standpoint  Tobacco abuse with abnormal chest x-ray We discussed importance of tobacco cessation   Profound shortness of breath It is difficult to know what triggered her recent significant shortness of breath, either could be due to COPD exacerbation in the setting of severe anemia Shortness of breath has improved with conservative management Continue to monitor closely  Discharge planning Not ready to be discharged I will return to check on her on Friday  All questions were answered. The patient knows to call the clinic with any problems, questions or concerns.    Heath Lark, MD 04/04/16  6:19 AM

## 2016-04-04 NOTE — Progress Notes (Addendum)
[name] TEAM 1 - Stepdown/ICU TEAM  Amy Hall  SAY:[id number] DOB: March 05, 1965 DOA: 04/03/2016 PCP: Amy Morale, MD    Brief Narrative:  51 y.o. female with history of RA on chronic prednisone, asthma, DDD, arthritis, and tobacco abuse who presented with dysuria, shortness breath, wheezing, cough, facial swelling and blisters in her mouth.  Patient reported blisters on the side of her tongue as we as the back of her throat and gum area two weeks ago. She has not noted any blood in her stool. She has not had menstrual period for more than a year.Pt takes prednisone 10 mg twice a day chronically for rheumatoid arthritis and also frequently takes ibuprofen.  In the ED she was found to have a positive FOBT and a hemoglobin of 5.7 (was 12.2 on 818/17).    Subjective: The patient complains of ongoing pain in her mouth.  She reports significant epigastric and bilateral upper quadrant abdominal pain.  She denies chest pain or shortness of breath.  She has not had a bowel movement since her admission.  She remains nothing by mouth.  Assessment & Plan:  Upper GIB - abdom pain Suspect NSAID/prednisione associated gatristis/duodenitis v/s ulcer - Hall consulted for EGD   Acute severe blood loss anemia  s/p 2U PRBC thus far - being loaded w/ IV Fe - for Hall eval to search for source as pt thought to be actively bleeding - transfuse further as needed to keep Hgb > 6.9  Recent Labs Lab 04/03/16 1624 04/04/16 0616 04/04/16 0951  HGB 5.7* 7.3* 7.0*    B12 deficiency (191) Loading w/ daily SQ doses for 7 days  Acute asthma exacerbation  Much improved - follow   UTI tx for 3 days then stop - follow culture   RA Cont steroids to avoid addisonian crisis as pt is chronically dependent - wean back to baseline dose asap  Tobacco abuse   Oral blisters - facial swelling  Swelling has nearly resolved - ?allergic reaction - monitor - begin to wean IV steroids  DVT prophylaxis: SCDs Code  Status: FULL CODE Family Communication: no family present at time of exam  Disposition Plan: SDU until Hgb more stable   Consultants:  Amy Hall  Procedures: none  Antimicrobials:  Azithro 3/20 Rocephin 3/20 >  Objective: Blood pressure 119/80, pulse 94, temperature 97.9 F (36.6 C), temperature source Axillary, resp. rate 20, height 5\' 2"  (1.575 m), weight 71.6 kg (157 lb 14.4 oz), last menstrual period 01/16/2015, SpO2 93 %.  Intake/Output Summary (Last 24 hours) at 04/04/16 1203 Last data filed at 04/04/16 0800  Gross per 24 hour  Intake          4348.33 ml  Output                0 ml  Net          4348.33 ml   Filed Weights   04/03/16 2208  Weight: 71.6 kg (157 lb 14.4 oz)    Examination: General: No acute respiratory distress Lungs: Clear to auscultation bilaterally without wheezes or crackles Cardiovascular: Regular rate and rhythm without murmur gallop or rub normal S1 and S2 Abdomen: Nontender, nondistended, soft, bowel sounds positive, no rebound, no ascites, no appreciable mass Extremities: No significant cyanosis, clubbing, or edema bilateral lower extremities  CBC:  Recent Labs Lab 04/03/16 1624 04/04/16 0616 04/04/16 0951  WBC 17.5* 13.8* 15.3*  NEUTROABS 14.5*  --   --   HGB 5.7* 7.3* 7.0*  HCT  20.7* 25.2* 24.6*  MCV 73.9* 76.1* 75.9*  PLT 589* 457* 160*   Basic Metabolic Panel:  Recent Labs Lab 04/03/16 1624 04/04/16 0616  NA 140 137  K 3.3* 4.0  CL 105 105  CO2 26 24  GLUCOSE 117* 138*  BUN 10 6  CREATININE 0.68 0.56  CALCIUM 8.7* 8.1*  MG  --  1.8   GFR: Estimated Creatinine Clearance: 76.2 mL/min (by C-G formula based on SCr of 0.56 mg/dL).  Liver Function Tests:  Recent Labs Lab 04/03/16 1624  AST 21  ALT 25  ALKPHOS 48  BILITOT 0.3  PROT 5.8*  ALBUMIN 2.7*    Recent Labs Lab 04/03/16 2058  LIPASE 26    Coagulation Profile:  Recent Labs Lab 04/03/16 2044  INR 1.02    HbA1C: Hemoglobin A1C  Date/Time  Value Ref Range Status  09/21/2015 10:01 AM 5.7  Final    CBG:  Recent Labs Lab 04/04/16 0810  GLUCAP 129*    Recent Results (from the past 240 hour(s))  Rapid strep screen (not at [facility])     Status: None   Collection Time: 04/03/16  5:01 PM  Result Value Ref Range Status   Streptococcus, Group A Screen (Direct) NEGATIVE NEGATIVE Final    Comment: (NOTE) A Rapid Antigen test may result negative if the antigen level in the sample is below the detection level of this test. The FDA has not cleared this test as a stand-alone test therefore the rapid antigen negative result has reflexed to a Group A Strep culture.   Culture, group A strep     Status: None (Preliminary result)   Collection Time: 04/03/16  5:01 PM  Result Value Ref Range Status   Specimen Description THROAT  Final   Special Requests NONE Reflexed from [id number]  Final   Culture TOO YOUNG TO READ  Final   Report Status PENDING  Incomplete  Culture, blood (Routine X 2) w Reflex to ID Panel     Status: None (Preliminary result)   Collection Time: 04/03/16  7:17 PM  Result Value Ref Range Status   Specimen Description BLOOD RIGHT ANTECUBITAL  Final   Special Requests BOTTLES DRAWN AEROBIC AND ANAEROBIC 5CC  Final   Culture NO GROWTH < 24 HOURS  Final   Report Status PENDING  Incomplete  MRSA PCR Screening     Status: None   Collection Time: 04/03/16 10:01 PM  Result Value Ref Range Status   MRSA by PCR NEGATIVE NEGATIVE Final    Comment:        The GeneXpert MRSA Assay (FDA approved for NASAL specimens only), is one component of a comprehensive MRSA colonization surveillance program. It is not intended to diagnose MRSA infection nor to guide or monitor treatment for MRSA infections.      Scheduled Meds: . azithromycin  250 mg Oral Q24H  . cefTRIAXone (ROCEPHIN)  IV  1 g Intravenous Q24H  . cyanocobalamin  1,000 mcg Subcutaneous Daily  . dextromethorphan-guaiFENesin  1 tablet Oral BID  . diphenhydrAMINE  25  mg Oral BID  . famotidine  20 mg Oral BID  . folic acid  1 mg Oral Daily  . ipratropium  0.5 mg Nebulization TID  . levalbuterol  1.25 mg Nebulization TID  . loratadine  10 mg Oral Daily  . methylPREDNISolone (SOLU-MEDROL) injection  60 mg Intravenous TID  . nicotine  21 mg Transdermal Daily  . predniSONE  10 mg Oral BID WC  . sodium chloride flush  3 mL Intravenous Q12H   Continuous Infusions: . sodium chloride 100 mL/hr at 04/03/16 1655  . pantoprozole (PROTONIX) infusion 8 mg/hr (04/04/16 0800)     LOS: 1 day   Cherene Altes, MD [facility] Office  [phone] Pager - Text Page per Amion as per below:  On-Call/Text Page:      [email or web address]      password [email or web address]  If 7PM-7AM, please contact night-coverage [email or web address] Password [facility] 04/04/2016, 12:03 PM

## 2016-04-04 NOTE — Progress Notes (Signed)
PHARMACY - PHYSICIAN COMMUNICATION CRITICAL VALUE ALERT - BLOOD CULTURE IDENTIFICATION (BCID)  Results for orders placed or performed during the hospital encounter of 04/03/16  Blood Culture ID Panel (Reflexed) (Collected: 04/03/2016  7:17 PM)  Result Value Ref Range   Enterococcus species NOT DETECTED NOT DETECTED   Listeria monocytogenes NOT DETECTED NOT DETECTED   Staphylococcus species DETECTED (A) NOT DETECTED   Staphylococcus aureus NOT DETECTED NOT DETECTED   Methicillin resistance DETECTED (A) NOT DETECTED   Streptococcus species NOT DETECTED NOT DETECTED   Streptococcus agalactiae NOT DETECTED NOT DETECTED   Streptococcus pneumoniae NOT DETECTED NOT DETECTED   Streptococcus pyogenes NOT DETECTED NOT DETECTED   Acinetobacter baumannii NOT DETECTED NOT DETECTED   Enterobacteriaceae species NOT DETECTED NOT DETECTED   Enterobacter cloacae complex NOT DETECTED NOT DETECTED   Escherichia coli NOT DETECTED NOT DETECTED   Klebsiella oxytoca NOT DETECTED NOT DETECTED   Klebsiella pneumoniae NOT DETECTED NOT DETECTED   Proteus species NOT DETECTED NOT DETECTED   Serratia marcescens NOT DETECTED NOT DETECTED   Haemophilus influenzae NOT DETECTED NOT DETECTED   Neisseria meningitidis NOT DETECTED NOT DETECTED   Pseudomonas aeruginosa NOT DETECTED NOT DETECTED   Candida albicans NOT DETECTED NOT DETECTED   Candida glabrata NOT DETECTED NOT DETECTED   Candida krusei NOT DETECTED NOT DETECTED   Candida parapsilosis NOT DETECTED NOT DETECTED   Candida tropicalis NOT DETECTED NOT DETECTED    Name of physician (or Provider) Contacted: K. Schorr  Changes to prescribed antibiotics required: None, Methicillin resistant coag negative staph - likely contaminant  Uvaldo Rising, BCPS  Clinical Pharmacist Pager [phone]  04/04/2016 9:45 PM

## 2016-04-04 NOTE — ED Provider Notes (Signed)
[place] DEPT Provider Note   CSN: [id number] Arrival date & time: 04/03/16  1536     History   Chief Complaint Chief Complaint  Patient presents with  . Shortness of Breath  . Mouth Lesions  . Dysuria    HPI Navika Hoopes is a 51 y.o. female.  Patient started not feeling well on Sunday. Complaint of sore throat some dysuria some vomiting chills cough felt as if the tongue was swollen felt checks and blisters on her mouth and tongue. Felt there was bilateral facial swelling. No new medications. Has a history of asthma. Felt as if she was wheezing use her albuterol inhaler and not improved. [facility] treated with the steroid IM injection for possible allergic reaction and referred here for further workup. Patient's face is red neck and upper chest is red she sates that is [place]. Denies fever but does have chills.      Past Medical History:  Diagnosis Date  . Arthritis   . Asthma   . DDD (degenerative disc disease), lumbar   . Osteoporosis of lumbar spine   . Rheumatoid arthritis ([place])   . Sleep disorder     Patient Active Problem List   Diagnosis Date Noted  . Asthma exacerbation 04/03/2016  . Tobacco abuse 04/03/2016  . UTI (urinary tract infection) 04/03/2016  . Sepsis ([place]) 04/03/2016  . Symptomatic anemia 04/03/2016  . Allergic reaction 04/03/2016  . Severe anemia 04/03/2016  . Nausea & vomiting 04/03/2016  . Closed nondisplaced fracture of third metatarsal bone of left foot 12/05/2015  . Valgus deformity of great toe 12/05/2015  . 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 Lumbar radiculitis 11/24/2015  . Rheumatoid arthritis ([place]) 09/02/2015  . Degenerative disc disease, lumbar 09/02/2015  . Spinal stenosis of lumbar region 09/02/2015    Past Surgical History:  Procedure Laterality Date  . ELBOW SURGERY      OB History    No data available       Home Medications    Prior to Admission medications     Medication Sig Start Date End Date Taking? Authorizing Provider  acetaminophen-codeine (TYLENOL #3) 300-30 MG tablet Take 1 tablet by mouth every 12 (twelve) hours as needed for moderate pain. 04/03/16  Yes Dawn Lazarus Gowda, MD  albuterol (PROVENTIL HFA;VENTOLIN HFA) 108 (90 Base) MCG/ACT inhaler Inhale 2 puffs into the lungs every 6 (six) hours as needed for wheezing or shortness of breath. 03/20/16  Yes [name], MD  cetirizine (ZYRTEC) 10 MG tablet Take 1 tablet (10 mg total) by mouth daily. 03/12/16  Yes [name], MD  cyclobenzaprine (FLEXERIL) 5 MG tablet Take 1 tablet (5 mg total) by mouth 2 (two) times daily as needed for muscle spasms. 02/13/16  Yes [name], MD  ibuprofen (ADVIL,MOTRIN) 200 MG tablet Take 800 mg by mouth every 6 (six) hours as needed (swelling).   Yes Historical Provider, MD  predniSONE (DELTASONE) 10 MG tablet Take 1 tablet (10 mg total) by mouth 2 (two) times daily with a meal. 04/03/16  Yes Maren Reamer, MD  pregabalin (LYRICA) 75 MG capsule Take 1 capsule (75 mg total) by mouth 2 (two) times daily. Patient not taking: Reported on 03/12/2016 01/02/16   Trula Slade, DPM  sulfamethoxazole-trimethoprim (BACTRIM DS) 800-160 MG tablet Take 1 tablet by mouth 2 (two) times daily. Patient not taking: Reported on 04/03/2016 03/12/16   [name], MD    Family History Family History  Problem Relation Age of Onset  . Anemia Mother   . Hypertension Father   . Heart disease Father   . Hypertension Sister     Social History Social History  Substance Use Topics  . Smoking status: Current Every Day Smoker    Packs/day: 1.00    Types: Cigarettes  . Smokeless tobacco: Never Used  . Alcohol use 0.6 - 1.2 oz/week    1 - 2 Cans of beer per week     Comment: occ     Allergies   Gabapentin   Review of Systems Review of Systems  Constitutional: Positive for chills. Negative for fever.  HENT: Positive for facial swelling, sore throat and voice change.    Eyes: Negative for redness.  Respiratory: Positive for cough, shortness of breath and wheezing.   Cardiovascular: Negative for chest pain.  Gastrointestinal: Positive for nausea and vomiting. Negative for abdominal pain and diarrhea.  Genitourinary: Positive for dysuria.  Musculoskeletal: Negative for joint swelling and neck stiffness.  Skin: Negative for rash.  Neurological: Negative for headaches.  Hematological: Does not bruise/bleed easily.  Psychiatric/Behavioral: Negative for confusion.     Physical Exam Updated Vital Signs BP 129/88 (BP Location: Right Arm)   Pulse 98   Temp 97.6 F (36.4 C) (Axillary)   Resp (!) 28   Ht 5\' 2"  (1.575 m)   Wt 71.6 kg   LMP 01/16/2015   SpO2 98%   BMI 28.88 kg/m   Physical Exam  Constitutional: She is oriented to person, place, and time. She appears well-developed and well-nourished. No distress.  HENT:  Head: Normocephalic and atraumatic.  Mouth/Throat: Oropharynx is clear and moist. No oropharyngeal exudate.  Some blisters on the soft palate. Uvula midline slightly swollen erythematous. Tongue questionably slightly swollen no lip swelling.  Eyes: EOM are normal. Pupils are equal, round, and reactive to light.  Neck: Normal range of motion. Neck supple.  Cardiovascular: Regular rhythm and normal heart sounds.   Tachycardic  Pulmonary/Chest: Effort normal and breath sounds normal. She has no wheezes.  Abdominal: Soft. Bowel sounds are normal.  Musculoskeletal: Normal range of motion. She exhibits no edema.  Neurological: She is alert and oriented to person, place, and time. No cranial nerve deficit or sensory deficit. She exhibits normal muscle tone. Coordination normal.  Skin: Skin is warm. No rash noted. There is erythema.  Erythema to the face bilaterally and anterior upper chest. Patient states is from [place] note and vitals reviewed.    ED Treatments / Results  Labs (all labs ordered are listed, but only  abnormal results are displayed) Labs Reviewed  CBC - Abnormal; Notable for the following:       Result Value   WBC 17.5 (*)    RBC 2.80 (*)    Hemoglobin 5.7 (*)    HCT 20.7 (*)    MCV 73.9 (*)    MCH 20.4 (*)    MCHC 27.5 (*)    RDW 16.6 (*)    Platelets 589 (*)    All other components within normal limits  COMPREHENSIVE METABOLIC PANEL - Abnormal; Notable for the following:    Potassium 3.3 (*)    Glucose, Bld 117 (*)    Calcium 8.7 (*)    Total Protein 5.8 (*)    Albumin 2.7 (*)    All other components within normal limits  URINALYSIS, ROUTINE W REFLEX MICROSCOPIC - Abnormal; Notable for the following:    APPearance CLOUDY (*)  Leukocytes, UA MODERATE (*)    Bacteria, UA RARE (*)    Squamous Epithelial / LPF 0-5 (*)    All other components within normal limits  IRON AND TIBC - Abnormal; Notable for the following:    Iron <5 (*)    All other components within normal limits  FERRITIN - Abnormal; Notable for the following:    Ferritin 6 (*)    All other components within normal limits  RETICULOCYTES - Abnormal; Notable for the following:    RBC. 2.48 (*)    All other components within normal limits  DIFFERENTIAL - Abnormal; Notable for the following:    Neutro Abs 14.5 (*)    Monocytes Absolute 1.4 (*)    All other components within normal limits  SEDIMENTATION RATE - Abnormal; Notable for the following:    Sed Rate 35 (*)    All other components within normal limits  C-REACTIVE PROTEIN - Abnormal; Notable for the following:    CRP 2.6 (*)    All other components within normal limits  I-STAT CG4 LACTIC ACID, ED - Abnormal; Notable for the following:    Lactic Acid, Venous 2.29 (*)    All other components within normal limits  POC OCCULT BLOOD, ED - Abnormal; Notable for the following:    Fecal Occult Bld POSITIVE (*)    All other components within normal limits  RAPID STREP SCREEN (NOT AT [facility])  CULTURE, GROUP A STREP ([facility])  URINE CULTURE  CULTURE, BLOOD  (ROUTINE X 2)  CULTURE, BLOOD (ROUTINE X 2)  CULTURE, EXPECTORATED SPUTUM-ASSESSMENT  MRSA PCR SCREENING  VITAMIN B12  LACTATE DEHYDROGENASE  PROTIME-INR  SAVE SMEAR  LIPASE, BLOOD  PROCALCITONIN  FOLATE  PATHOLOGIST SMEAR REVIEW  HAPTOGLOBIN  CBC WITH DIFFERENTIAL/PLATELET  MAGNESIUM  CORTISOL-AM, BLOOD  INFLUENZA PANEL BY PCR (TYPE A & B)  LACTIC ACID, PLASMA  LACTIC ACID, PLASMA  HIV ANTIBODY (ROUTINE TESTING)  BASIC METABOLIC PANEL  CBC  STREP PNEUMONIAE URINARY ANTIGEN  POC OCCULT BLOOD, ED  TYPE AND SCREEN  PREPARE RBC (CROSSMATCH)  ABO/RH   Results for orders placed or performed during the hospital encounter of 04/03/16  Rapid strep screen (not at [facility])  Result Value Ref Range   Streptococcus, Group A Screen (Direct) NEGATIVE NEGATIVE  CBC  Result Value Ref Range   WBC 17.5 (H) 4.0 - 10.5 K/uL   RBC 2.80 (L) 3.87 - 5.11 MIL/uL   Hemoglobin 5.7 (LL) 12.0 - 15.0 g/dL   HCT 20.7 (L) 36.0 - 46.0 %   MCV 73.9 (L) 78.0 - 100.0 fL   MCH 20.4 (L) 26.0 - 34.0 pg   MCHC 27.5 (L) 30.0 - 36.0 g/dL   RDW 16.6 (H) 11.5 - 15.5 %   Platelets 589 (H) 150 - 400 K/uL  Comprehensive metabolic panel  Result Value Ref Range   Sodium 140 135 - 145 mmol/L   Potassium 3.3 (L) 3.5 - 5.1 mmol/L   Chloride 105 101 - 111 mmol/L   CO2 26 22 - 32 mmol/L   Glucose, Bld 117 (H) 65 - 99 mg/dL   BUN 10 6 - 20 mg/dL   Creatinine, Ser 0.68 0.44 - 1.00 mg/dL   Calcium 8.7 (L) 8.9 - 10.3 mg/dL   Total Protein 5.8 (L) 6.5 - 8.1 g/dL   Albumin 2.7 (L) 3.5 - 5.0 g/dL   AST 21 15 - 41 U/L   ALT 25 14 - 54 U/L   Alkaline Phosphatase 48 38 - 126 U/L  Total Bilirubin 0.3 0.3 - 1.2 mg/dL   GFR calc non Af Amer >60 >60 mL/min   GFR calc Af Amer >60 >60 mL/min   Anion gap 9 5 - 15  Urinalysis, Routine w reflex microscopic  Result Value Ref Range   Color, Urine YELLOW YELLOW   APPearance CLOUDY (A) CLEAR   Specific Gravity, Urine 1.009 1.005 - 1.030   pH 7.0 5.0 - 8.0   Glucose, UA NEGATIVE  NEGATIVE mg/dL   Hgb urine dipstick NEGATIVE NEGATIVE   Bilirubin Urine NEGATIVE NEGATIVE   Ketones, ur NEGATIVE NEGATIVE mg/dL   Protein, ur NEGATIVE NEGATIVE mg/dL   Nitrite NEGATIVE NEGATIVE   Leukocytes, UA MODERATE (A) NEGATIVE   RBC / HPF 0-5 0 - 5 RBC/hpf   WBC, UA 6-30 0 - 5 WBC/hpf   Bacteria, UA RARE (A) NONE SEEN   Squamous Epithelial / LPF 0-5 (A) NONE SEEN   Mucous PRESENT    Amorphous Crystal PRESENT   Vitamin B12  Result Value Ref Range   Vitamin B-12 191 180 - 914 pg/mL  Iron and TIBC  Result Value Ref Range   Iron <5 (L) 28 - 170 ug/dL   TIBC 315 250 - 450 ug/dL   Saturation Ratios NOT CALCULATED 10.4 - 31.8 %   UIBC NOT CALCULATED ug/dL  Ferritin  Result Value Ref Range   Ferritin 6 (L) 11 - 307 ng/mL  Reticulocytes  Result Value Ref Range   Retic Ct Pct 3.0 0.4 - 3.1 %   RBC. 2.48 (L) 3.87 - 5.11 MIL/uL   Retic Count, Manual 74.4 19.0 - 186.0 K/uL  Lactate dehydrogenase  Result Value Ref Range   LDH 183 98 - 192 U/L  Protime-INR  Result Value Ref Range   Prothrombin Time 13.5 11.4 - 15.2 seconds   INR 1.02   Differential  Result Value Ref Range   Neutrophils Relative % 83 %   Lymphocytes Relative 9 %   Monocytes Relative 8 %   Eosinophils Relative 0 %   Basophils Relative 0 %   Band Neutrophils 0 %   Metamyelocytes Relative 0 %   Myelocytes 0 %   Promyelocytes Absolute 0 %   Blasts 0 %   nRBC 0 0 /100 WBC   Other 0 %   Neutro Abs 14.5 (H) 1.7 - 7.7 K/uL   Lymphs Abs 1.6 0.7 - 4.0 K/uL   Monocytes Absolute 1.4 (H) 0.1 - 1.0 K/uL   Eosinophils Absolute 0.0 0.0 - 0.7 K/uL   Basophils Absolute 0.0 0.0 - 0.1 K/uL   RBC Morphology POLYCHROMASIA PRESENT   Save smear  Result Value Ref Range   Smear Review SMEAR STAINED AND AVAILABLE FOR REVIEW   Sedimentation rate  Result Value Ref Range   Sed Rate 35 (H) 0 - 22 mm/hr  Lipase, blood  Result Value Ref Range   Lipase 26 11 - 51 U/L  C-reactive protein  Result Value Ref Range   CRP 2.6 (H)  <1.0 mg/dL  Procalcitonin  Result Value Ref Range   Procalcitonin <0.10 ng/mL  I-Stat CG4 Lactic Acid, ED  Result Value Ref Range   Lactic Acid, Venous 2.29 (HH) 0.5 - 1.9 mmol/L   Comment NOTIFIED PHYSICIAN   POC occult blood, ED  Result Value Ref Range   Fecal Occult Bld POSITIVE (A) NEGATIVE  Type and screen [facility]  Result Value Ref Range   ABO/RH(D) B POS    Antibody Screen NEG  Sample Expiration 04/06/2016    Unit Number [id number]    Blood Component Type RED CELLS,LR    Unit division 00    Status of Unit ALLOCATED    Transfusion Status OK TO TRANSFUSE    Crossmatch Result Compatible    Unit Number [id number]    Blood Component Type RED CELLS,LR    Unit division 00    Status of Unit ISSUED    Transfusion Status OK TO TRANSFUSE    Crossmatch Result Compatible   Prepare RBC  Result Value Ref Range   Order Confirmation ORDER PROCESSED BY BLOOD BANK   ABO/Rh  Result Value Ref Range   ABO/RH(D) B POS   BPAM RBC  Result Value Ref Range   Blood Product Unit Number [id number]    Unit Type and Rh 7300    Blood Product Expiration Date [id number]    ISSUE DATE / TIME [id number]    Blood Product Unit Number [id number]    PRODUCT CODE [id number]    Unit Type and Rh 7300    Blood Product Expiration Date [id number]      EKG  EKG Interpretation  Date/Time:  Tuesday April 03 2016 16:56:20 EDT Ventricular Rate:  94 PR Interval:    QRS Duration: 83 QT Interval:  371 QTC Calculation: 464 R Axis:   70 Text Interpretation:  Sinus rhythm Borderline short PR interval No previous ECGs available Confirmed by Juvencio Verdi  MD, Sumedha Munnerlyn ([id number]) on 04/03/2016 4:59:50 PM       Radiology Dg Chest 2 View  Result Date: 04/03/2016 CLINICAL DATA:  Sore throat, unable to the eat, shortness of breath, wheezing, smoker EXAM: CHEST  2 VIEW COMPARISON:  None. FINDINGS: Diffuse bilateral reticulonodular opacities throughout all lobes of both lungs,  suspicious for chronic interstitial lung disease. No definite superimposed focal pneumonia, collapse or consolidation. Normal heart size vascularity. No effusion or pneumothorax. Hiatal hernia suspected. Compression fracture of the L1 superior endplate, remote and unchanged compared 08/29/2015. IMPRESSION: Diffuse reticulonodular pulmonary opacities throughout both lungs, suspect chronic interstitial lung disease. No superimposed CHF or pneumonia Hiatal hernia suspected L1 superior endplate compression fracture, remote. Electronically Signed   By: Jerilynn Mages.  Shick M.D.   On: 04/03/2016 17:35    Procedures Procedures (including critical care time)  CRITICAL CARE Performed by: Fredia Sorrow Total critical care time: 30 minutes Critical care time was exclusive of separately billable procedures and treating other patients. Critical care was necessary to treat or prevent imminent or life-threatening deterioration. Critical care was time spent personally by me on the following activities: development of treatment plan with patient and/or surrogate as well as nursing, discussions with consultants, evaluation of patient's response to treatment, examination of patient, obtaining history from patient or surrogate, ordering and performing treatments and interventions, ordering and review of laboratory studies, ordering and review of radiographic studies, pulse oximetry and re-evaluation of patient's condition.   Medications Ordered in ED Medications  0.9 %  sodium chloride infusion ( Intravenous Transfusing/Transfer 04/03/16 2136)  cefTRIAXone (ROCEPHIN) 1 g in dextrose 5 % 50 mL IVPB (1 g Intravenous Given 04/04/16 0017)  ondansetron (ZOFRAN) injection 4 mg (4 mg Intravenous Given 04/03/16 2113)  predniSONE (DELTASONE) tablet 10 mg (not administered)  loratadine (CLARITIN) tablet 10 mg (not administered)  cyclobenzaprine (FLEXERIL) tablet 5 mg (5 mg Oral Given 04/03/16 2224)  nicotine (NICODERM CQ - dosed in  mg/24 hours) patch 21 mg (21 mg Transdermal Patch Applied 04/04/16 0023)  ipratropium (ATROVENT) nebulizer solution 0.5 mg (0.5 mg Nebulization Not  Given 04/04/16 0000)  levalbuterol (XOPENEX) nebulizer solution 1.25 mg (1.25 mg Nebulization Not Given 04/04/16 0200)  dextromethorphan-guaiFENesin (MUCINEX DM) 30-600 MG per 12 hr tablet 1 tablet (1 tablet Oral Given 04/03/16 2224)  famotidine (PEPCID) tablet 20 mg (not administered)  diphenhydrAMINE (BENADRYL) capsule 25 mg (not administered)  methylPREDNISolone sodium succinate (SOLU-MEDROL) 125 mg/2 mL injection 60 mg (60 mg Intravenous Given 04/04/16 0020)  sodium chloride flush (NS) 0.9 % injection 3 mL (3 mLs Intravenous Given 04/03/16 2229)  acetaminophen (TYLENOL) tablet 650 mg (not administered)    Or  acetaminophen (TYLENOL) suppository 650 mg (not administered)  zolpidem (AMBIEN) tablet 5 mg (not administered)  azithromycin (ZITHROMAX) tablet 500 mg (500 mg Oral Given 04/03/16 2224)    Followed by  azithromycin (ZITHROMAX) tablet 250 mg (not administered)  sodium chloride 0.9 % bolus 1,000 mL (0 mLs Intravenous Stopped 04/03/16 1820)  ondansetron (ZOFRAN) injection 4 mg (4 mg Intravenous Given 04/03/16 1655)  diphenhydrAMINE (BENADRYL) injection 25 mg (25 mg Intravenous Given 04/03/16 1654)  famotidine (PEPCID) IVPB 20 mg premix (0 mg Intravenous Stopped 04/03/16 1819)  0.9 %  sodium chloride infusion (10 mL/hr Intravenous Transfusing/Transfer 04/03/16 2136)  sodium chloride 0.9 % bolus 1,000 mL (0 mLs Intravenous Stopped 04/03/16 2121)  potassium chloride 20 MEQ/15ML (10%) solution 20 mEq (20 mEq Oral Given 04/03/16 2225)     Initial Impression / Assessment and Plan / ED Course  I have reviewed the triage vital signs and the nursing notes.  Pertinent labs & imaging results that were available during my care of the patient were reviewed by me and considered in my medical decision making (see chart for details).    [facility] concerns for an allergic reaction. Patient given IM Solu-Medrol. Patient talks about sore throat and blisters to the tongue and gum area burning with urination shortness of breath and wheezing. Patient does have albuterol inhaler at home and has used it. And bilateral facial swelling and felt as if the tongue was swollen. Patient used her inhaler without any relief.  Patient continued with the potential allergic protocol and given Pepcid and Benadryl IV. As stated patient received Solu-Medrol before. Tongue did not appear to be sniffily swollen. Uvula and soft palate was erythematous. No exudate. Rapid strep was negative.  However patient had a marked anemia with a hemoglobin in the 5 range. And a decreased MCV. Patient denied any significant vaginal bleeding blood in her bowel movements or black stool. Patient also had a elevated white blood cell count of 17,000. The previously patient had a white count of 12,000 in August with an elevated neutrophil count. Raising some concerns possibly for leukemia.  Hospitalist was concerned about the possibility of an acute leukemia and I consult the hematology oncology they were not overly impressed by the numbers but will see the patient in the morning. CBC originally did not have a differential differential added.  But for the anemia she does need blood transfusion 2 units ordered and started down here in the emergency department. Patient remained alert nontoxic no acute distress. Did not have vital signs consistent with sepsis other than the fact that the heart rate was consistently in the low 100s. Never was hypotensive. No fevers.  Argument could be made with a leukocytosis and the heart rate for sepsis parameters. But clinically symptoms did not seem to be related to sepsis. Patient did have blood cultures done. Patient was not started on antibiotics.  Final Clinical Impressions(s) / ED Diagnoses   Final diagnoses:   Iron deficiency anemia, unspecified iron deficiency anemia type  Leukocytosis, unspecified type    New Prescriptions Current Discharge Medication List       Fredia Sorrow, MD 04/04/16 [phone]

## 2016-04-05 ENCOUNTER — Inpatient Hospital Stay (HOSPITAL_COMMUNITY): Payer: Self-pay | Admitting: Certified Registered"

## 2016-04-05 ENCOUNTER — Encounter (HOSPITAL_COMMUNITY): Payer: Self-pay | Admitting: *Deleted

## 2016-04-05 ENCOUNTER — Encounter (HOSPITAL_COMMUNITY)
Admission: EM | Disposition: A | Discharge status: Home / Self Care | Payer: Self-pay | Source: Home / Self Care | Attending: Internal Medicine

## 2016-04-05 HISTORY — PX: ESOPHAGOGASTRODUODENOSCOPY (EGD) WITH PROPOFOL: SHX5813

## 2016-04-05 LAB — BPAM RBC
BLOOD PRODUCT EXPIRATION DATE: 201804112359
BLOOD PRODUCT EXPIRATION DATE: 201804122359
ISSUE DATE / TIME: 201803210107
ISSUE DATE / TIME: 201803202031
UNIT TYPE AND RH: 7300
Unit Type and Rh: 7300

## 2016-04-05 LAB — TYPE AND SCREEN
ABO/RH(D): B POS
Antibody Screen: NEGATIVE
UNIT DIVISION: 0
Unit division: 0

## 2016-04-05 LAB — COMPREHENSIVE METABOLIC PANEL
ALBUMIN: 2.4 g/dL — AB (ref 3.5–5.0)
ALT: 65 U/L — ABNORMAL HIGH (ref 14–54)
AST: 33 U/L (ref 15–41)
Alkaline Phosphatase: 48 U/L (ref 38–126)
Anion gap: 9 (ref 5–15)
BUN: 5 mg/dL — AB (ref 6–20)
CHLORIDE: 107 mmol/L (ref 101–111)
CO2: 24 mmol/L (ref 22–32)
CREATININE: 0.72 mg/dL (ref 0.44–1.00)
Calcium: 8.3 mg/dL — ABNORMAL LOW (ref 8.9–10.3)
GFR calc Af Amer: >60 mL/min (ref >60)
Glucose, Bld: 141 mg/dL — ABNORMAL HIGH (ref 65–99)
Potassium: 4.3 mmol/L (ref 3.5–5.1)
Sodium: 140 mmol/L (ref 135–145)
Total Bilirubin: 0.6 mg/dL (ref 0.3–1.2)
Total Protein: 5.1 g/dL — ABNORMAL LOW (ref 6.5–8.1)

## 2016-04-05 LAB — CBC
HCT: 25 % — ABNORMAL LOW (ref 36.0–46.0)
Hemoglobin: 7.1 g/dL — ABNORMAL LOW (ref 12.0–15.0)
MCH: 21.8 pg — ABNORMAL LOW (ref 26.0–34.0)
MCHC: 28.4 g/dL — ABNORMAL LOW (ref 30.0–36.0)
MCV: 76.9 fL — AB (ref 78.0–100.0)
PLATELETS: 459 10*3/uL — AB (ref 150–400)
RBC: 3.25 MIL/uL — AB (ref 3.87–5.11)
RDW: 17.1 % — ABNORMAL HIGH (ref 11.5–15.5)
WBC: 13.3 10*3/uL — ABNORMAL HIGH (ref 4.0–10.5)

## 2016-04-05 LAB — URINALYSIS, ROUTINE W REFLEX MICROSCOPIC
Bilirubin Urine: NEGATIVE
GLUCOSE, UA: NEGATIVE mg/dL
HGB URINE DIPSTICK: NEGATIVE
Ketones, ur: NEGATIVE mg/dL
Nitrite: NEGATIVE
PH: 6 (ref 5.0–8.0)
PROTEIN: NEGATIVE mg/dL
Specific Gravity, Urine: 1.013 (ref 1.005–1.030)

## 2016-04-05 LAB — GLUCOSE, CAPILLARY
GLUCOSE-CAPILLARY: 119 mg/dL — AB (ref 65–99)
Glucose-Capillary: 93 mg/dL (ref 65–99)

## 2016-04-05 LAB — HAPTOGLOBIN: Haptoglobin: 213 mg/dL — ABNORMAL HIGH (ref 34–200)

## 2016-04-05 SURGERY — ESOPHAGOGASTRODUODENOSCOPY (EGD) WITH PROPOFOL
Anesthesia: Monitor Anesthesia Care | Laterality: Left

## 2016-04-05 MED ORDER — PROMETHAZINE HCL 25 MG/ML IJ SOLN
6.2500 mg | INTRAMUSCULAR | Status: DC | PRN
Start: 2016-04-05 — End: 2016-04-06

## 2016-04-05 MED ORDER — LACTATED RINGERS IV SOLN
INTRAVENOUS | Status: DC
  Administered 2016-04-05: 1000 mL via INTRAVENOUS

## 2016-04-05 MED ORDER — LACTATED RINGERS IV SOLN
INTRAVENOUS | Status: DC | PRN
  Administered 2016-04-05: 10:00:00 via INTRAVENOUS

## 2016-04-05 MED ORDER — METHYLPREDNISOLONE SODIUM SUCC 125 MG IJ SOLR
60.0000 mg | Freq: Every day | INTRAMUSCULAR | Status: DC
  Administered 2016-04-06: 60 mg via INTRAVENOUS
  Filled 2016-04-05: qty 2

## 2016-04-05 MED ORDER — SODIUM CHLORIDE 0.9 % IV SOLN: INTRAVENOUS | Status: DC

## 2016-04-05 MED ORDER — FENTANYL CITRATE (PF) 100 MCG/2ML IJ SOLN: 25.0000 ug | INTRAMUSCULAR | Status: DC | PRN

## 2016-04-05 MED ORDER — POLYETHYLENE GLYCOL 3350 17 G PO PACK
17.0000 g | PACK | Freq: Every day | ORAL | Status: DC
  Administered 2016-04-05 – 2016-04-06 (×2): 17 g via ORAL
  Filled 2016-04-05 (×2): qty 1

## 2016-04-05 MED ORDER — LIDOCAINE HCL (CARDIAC) 20 MG/ML IV SOLN
INTRAVENOUS | Status: DC | PRN
  Administered 2016-04-05: 40 mg via INTRAVENOUS

## 2016-04-05 MED ORDER — PROPOFOL 500 MG/50ML IV EMUL
INTRAVENOUS | Status: DC | PRN
Start: 2016-04-05 — End: 2016-04-05
  Administered 2016-04-05: 125 ug/kg/min via INTRAVENOUS

## 2016-04-05 MED ORDER — SUCRALFATE 1 GM/10ML PO SUSP
1.0000 g | Freq: Three times a day (TID) | ORAL | Status: DC
  Administered 2016-04-05 – 2016-04-06 (×5): 1 g via ORAL
  Filled 2016-04-05 (×5): qty 10

## 2016-04-05 MED ORDER — BUTAMBEN-TETRACAINE-BENZOCAINE 2-2-14 % EX AERO
INHALATION_SPRAY | CUTANEOUS | Status: DC | PRN
  Administered 2016-04-05: 2 via TOPICAL

## 2016-04-05 MED ORDER — PANTOPRAZOLE SODIUM 40 MG IV SOLR
40.0000 mg | Freq: Two times a day (BID) | INTRAVENOUS | Status: DC
  Administered 2016-04-05 – 2016-04-06 (×2): 40 mg via INTRAVENOUS
  Filled 2016-04-05 (×2): qty 40

## 2016-04-05 MED ORDER — PROPOFOL 10 MG/ML IV BOLUS
INTRAVENOUS | Status: DC | PRN
  Administered 2016-04-05 (×2): 10 mg via INTRAVENOUS

## 2016-04-05 NOTE — Interval H&P Note (Signed)
History and Physical Interval Note:  04/05/2016 10:12 AM  Amy Hall  has presented today for surgery, with the diagnosis of iron-deficiency anemia, odynophagia, abdominal pain.  The various methods of treatment have been discussed with the patient and family. After consideration of risks, benefits and other options for treatment, the patient has consented to  Procedure(s): ESOPHAGOGASTRODUODENOSCOPY (EGD) WITH PROPOFOL (Left) as a surgical intervention .  The patient's history has been reviewed, patient examined, no change in status, stable for surgery.  I have reviewed the patient's chart and labs.  Questions were answered to the patient's satisfaction.     Rayder Sullenger M  Assessment:  1.  Odynophagia. 2.  Iron-deficiency anemia.  Plan:  1.  Endoscopy. 2.  Risks (bleeding, infection, bowel perforation that could require surgery, sedation-related changes in cardiopulmonary systems), benefits (identification and possible treatment of source of symptoms, exclusion of certain causes of symptoms), and alternatives (watchful waiting, radiographic imaging studies, empiric medical treatment) of upper endoscopy (EGD) were explained to patient/family in detail and patient wishes to proceed.

## 2016-04-05 NOTE — Progress Notes (Signed)
Patient was unable to tolerate any intake without being nauseous or vomiting. Bowel prep not completed. Zofran given with mild relief to take PM meds. Patient wishes to not drink or eat anything tonight.

## 2016-04-05 NOTE — Transfer of Care (Signed)
Immediate Anesthesia Transfer of Care Note  Patient: Amy Hall  Procedure(s) Performed: Procedure(s): ESOPHAGOGASTRODUODENOSCOPY (EGD) WITH PROPOFOL (Left)  Patient Location: Endoscopy Unit  Anesthesia Type:MAC  Level of Consciousness: awake, alert  and oriented  Airway & Oxygen Therapy: Patient Spontanous Breathing and Patient connected to nasal cannula oxygen  Post-op Assessment: Report given to RN, Post -op Vital signs reviewed and stable and Patient moving all extremities X 4  Post vital signs: Reviewed and stable  Last Vitals:  Vitals:   04/05/16 0918 04/05/16 1038  BP: (!) 152/91 (!) 131/109  Pulse:  86  Resp: (!) 23 18  Temp: 37.1 C 37.1 C    Last Pain:  Vitals:   04/05/16 1038  TempSrc: Oral  PainSc:          Complications: No apparent anesthesia complications

## 2016-04-05 NOTE — Op Note (Signed)
[facility] Patient Name: Amy Hall Procedure Date : 04/05/2016 MRN: [id number] Attending MD: Arta Silence , MD Date of Birth: 23-Nov-1965 CSN: [id number] Age: 51 Admit Type: Inpatient Procedure:                Upper GI endoscopy Indications:              Epigastric abdominal pain, Iron deficiency anemia,                            Odynophagia Providers:                Arta Silence, MD, Dortha Schwalbe RN, RN, Alfonso Patten, Technician, Phill Myron. Proofreader, CRNA Referring MD:              Medicines:                Monitored Anesthesia Care Complications:            No immediate complications. Estimated Blood Loss:     Estimated blood loss was minimal. Procedure:                Pre-Anesthesia Assessment:                           - Prior to the procedure, a History and Physical                            was performed, and patient medications and                            allergies were reviewed. The patient's tolerance of                            previous anesthesia was also reviewed. The risks                            and benefits of the procedure and the sedation                            options and risks were discussed with the patient.                            All questions were answered, and informed consent                            was obtained. Prior Anticoagulants: The patient has                            taken no previous anticoagulant or antiplatelet                            agents. ASA Grade Assessment: II - A patient with  mild systemic disease. After reviewing the risks                            and benefits, the patient was deemed in                            satisfactory condition to undergo the procedure.                           After obtaining informed consent, the endoscope was                            passed under direct vision. Throughout the   procedure, the patient's blood pressure, pulse, and                            oxygen saturations were monitored continuously. The                            [id number]) scope was introduced through the                            mouth, and advanced to the second part of duodenum.                            The upper GI endoscopy was accomplished without                            difficulty. The patient tolerated the procedure                            well. Scope In: Scope Out: Findings:      LA Grade D (one or more mucosal breaks involving at least 75% of       esophageal circumference) esophagitis was found 25 to 34 cm from the       incisors. Estimated blood loss was minimal.      A medium-sized hiatal hernia was present.      The exam of the esophagus was otherwise normal.      The entire examined stomach was normal.      The duodenal bulb, first portion of the duodenum and second portion of       the duodenum were normal. Impression:               - LA Grade D esophagitis.                           - Medium-sized hiatal hernia.                           - Normal stomach.                           - Normal duodenal bulb, first portion of the  duodenum and second portion of the duodenum.                           - No specimens collected. Moderate Sedation:      None Recommendation:           - Return patient to hospital ward for ongoing care.                           - Clear liquid diet today.                           - Pantoprazole suspension 40 mg po bid                           - Sucralfate suspension 1 gram po QAC/QHS.                           - Minimize NSAIDs.                           - [facility] will follow. If no gradual improvement                            over the next couple days, would consider repeat                            endoscopy with biopsies for CMV- and                            HSV-esophagitis; clinical  appearance of her                            esophagitis is confluent and without punctate                            ulcers and otherwise very typical of severe                            reflux-type esophagitis. Procedure Code(s):        --- Professional ---                           [phone], Esophagogastroduodenoscopy, flexible,                            transoral; diagnostic, including collection of                            specimen(s) by brushing or washing, when performed                            (separate procedure) Diagnosis Code(s):        --- Professional ---                           K20.9, Esophagitis, unspecified  K44.9, Diaphragmatic hernia without obstruction or                            gangrene                           R10.13, Epigastric pain                           D50.9, Iron deficiency anemia, unspecified                           R13.10, Dysphagia, unspecified CPT copyright 2016 [employer]. All rights reserved. The codes documented in this report are preliminary and upon coder review may  be revised to meet current compliance requirements. Arta Silence, MD 04/05/2016 10:39:58 AM This report has been signed electronically. Number of Addenda: 0

## 2016-04-05 NOTE — Progress Notes (Signed)
PROGRESS NOTE    Amy Hall  JKD:[id number] DOB: [birth year] DOA: 04/03/2016 PCP: [name], MD     Brief Narrative:  Amy Hall is a 51 y.o.femalewith history of RA on chronic prednisone, asthma, DDD, arthritis, and tobacco abuse who presented with dysuria, shortness breath, wheezing, cough, facial swelling and blisters in her mouth.  Patient reported blisters on the side of her tongue as we as the back of her throat and gum area two weeks ago. She has not notedany blood in her stool or in vomitus. She has not had menstrual period for more than a year.Pt takes prednisone 10 mg twice a day chronically for rheumatoid arthritis and also frequently takes ibuprofen (4-5 pills every 4-5 hours for years). In the ED she was found to have a positive FOBT and a hemoglobin of 5.7 (was 12.2 on 818/17).    Assessment & Plan:   Principal Problem:   Symptomatic anemia Active Problems:   Rheumatoid arthritis (HCC)   Asthma exacerbation   Tobacco abuse   UTI (urinary tract infection)   Sepsis (HCC)   Allergic reaction   Severe anemia   Nausea & vomiting   GI bleed  Acute blood loss anemia -GI following -EGD on 3/22: with grade D esophagitis   -Stop NSAIDs -Continue PPI -Sucralfate  -Status post 2 units packed red blood cells, monitor CBC  -Possible repeat endoscopy if no gradual improvement "with biopsies for CMV- and HSV-esophagitis; clinical appearance of her esophagitis is confluent and without punctate ulcers and otherwise very typical of severe reflux-type esophagitis."  Acute asthma exacerbation -Stable  Dysuria -Without UTI on UA -Has been on rocephin x 3 days - stop   Rheumatoid arthritis -Continue steroids, patient is chronically dependent  Oral blisters, facial swelling -?Allergic reaction -Wean IV steroids -Improving      DVT prophylaxis: SCDs Code Status: Full Family Communication: No family at bedside Disposition Plan: pending improvement and GI  plan   Consultants:   GI  Procedures:   EGD 3/21  Antimicrobials:  Anti-infectives    Start     Dose/Rate Route Frequency Ordered Stop   04/04/16 2130  azithromycin (ZITHROMAX) tablet 250 mg  Status:  Discontinued     250 mg Oral Every 24 hours 04/03/16 2114 04/04/16 1215   04/03/16 2130  azithromycin (ZITHROMAX) tablet 500 mg     500 mg Oral Daily 04/03/16 2114 04/03/16 2224   04/03/16 2030  cefTRIAXone (ROCEPHIN) 1 g in dextrose 5 % 50 mL IVPB     1 g 100 mL/hr over 30 Minutes Intravenous Every 24 hours 04/03/16 2017 04/06/16 2029      Subjective: She is feeling well this morning. She continues to have some epigastric abdominal pain but denies any blood or dark stools, no blood or coffee-ground emesis.  Objective: Vitals:   04/05/16 1038 04/05/16 1040 04/05/16 1050 04/05/16 1201  BP: (!) 131/109 (!) 131/109 118/75 (!) 121/92  Pulse: 86 83 71 82  Resp: 18 20 (!) 22 (!) 21  Temp: 98.7 F (37.1 C)   98.6 F (37 C)  TempSrc: Oral   Oral  SpO2: 97% 99% 98% 94%  Weight:      Height:        Intake/Output Summary (Last 24 hours) at 04/05/16 1302 Last data filed at 04/05/16 1038  Gross per 24 hour  Intake          3149.58 ml  Output  0 ml  Net          3149.58 ml   Filed Weights   04/03/16 2208  Weight: 71.6 kg (157 lb 14.4 oz)    Examination:  General exam: Appears calm and comfortable  Respiratory system: Clear to auscultation. Respiratory effort normal. Cardiovascular system: S1 & S2 heard, RRR. No JVD, murmurs, rubs, gallops or clicks. No pedal edema. Gastrointestinal system: Abdomen is nondistended, soft and + tender to palpation epigastric. No organomegaly or masses felt. Normal bowel sounds heard. Central nervous system: Alert and oriented. No focal neurological deficits. Extremities: Symmetric 5 x 5 power. Skin: No rashes, lesions or ulcers Psychiatry: Judgement and insight appear normal. Mood & affect appropriate.   Data Reviewed: I have  personally reviewed following labs and imaging studies  CBC:  Recent Labs Lab 04/03/16 1624 04/04/16 0616 04/04/16 0951 04/04/16 1436 04/05/16 0345  WBC 17.5* 13.8* 15.3* 18.9* 13.3*  NEUTROABS 14.5*  --   --   --   --   HGB 5.7* 7.3* 7.0* 7.5* 7.1*  HCT 20.7* 25.2* 24.6* 25.2* 25.0*  MCV 73.9* 76.1* 75.9* 75.9* 76.9*  PLT 589* 457* 466* 470* 846*   Basic Metabolic Panel:  Recent Labs Lab 04/03/16 1624 04/04/16 0616 04/05/16 0345  NA 140 137 140  K 3.3* 4.0 4.3  CL 105 105 107  CO2 26 24 24   GLUCOSE 117* 138* 141*  BUN 10 6 5*  CREATININE 0.68 0.56 0.72  CALCIUM 8.7* 8.1* 8.3*  MG  --  1.8  --    GFR: Estimated Creatinine Clearance: 76.2 mL/min (by C-G formula based on SCr of 0.72 mg/dL). Liver Function Tests:  Recent Labs Lab 04/03/16 1624 04/05/16 0345  AST 21 33  ALT 25 65*  ALKPHOS 48 48  BILITOT 0.3 0.6  PROT 5.8* 5.1*  ALBUMIN 2.7* 2.4*    Recent Labs Lab 04/03/16 2058  LIPASE 26   No results for input(s): AMMONIA in the last 168 hours. Coagulation Profile:  Recent Labs Lab 04/03/16 2044  INR 1.02   Cardiac Enzymes: No results for input(s): CKTOTAL, CKMB, CKMBINDEX, TROPONINI in the last 168 hours. BNP (last 3 results) No results for input(s): PROBNP in the last 8760 hours. HbA1C: No results for input(s): HGBA1C in the last 72 hours. CBG:  Recent Labs Lab 04/04/16 0810 04/05/16 0740 04/05/16 1228  GLUCAP 129* 119* 93   Lipid Profile: No results for input(s): CHOL, HDL, LDLCALC, TRIG, CHOLHDL, LDLDIRECT in the last 72 hours. Thyroid Function Tests: No results for input(s): TSH, T4TOTAL, FREET4, T3FREE, THYROIDAB in the last 72 hours. Anemia Panel:  Recent Labs  04/03/16 2044  VITAMINB12 191  FERRITIN 6*  TIBC 315  IRON <5*  RETICCTPCT 3.0   Sepsis Labs:  Recent Labs Lab 04/03/16 1956 04/03/16 2058 04/04/16 0616 04/04/16 0906  PROCALCITON  --  <0.10  --   --   LATICACIDVEN 2.29*  --  1.9 1.7    Recent  Results (from the past 240 hour(s))  Rapid strep screen (not at [facility])     Status: None   Collection Time: 04/03/16  5:01 PM  Result Value Ref Range Status   Streptococcus, Group A Screen (Direct) NEGATIVE NEGATIVE Final    Comment: (NOTE) A Rapid Antigen test may result negative if the antigen level in the sample is below the detection level of this test. The FDA has not cleared this test as a stand-alone test therefore the rapid antigen negative result has reflexed to a  Group A Strep culture.   Culture, group A strep     Status: None (Preliminary result)   Collection Time: 04/03/16  5:01 PM  Result Value Ref Range Status   Specimen Description THROAT  Final   Special Requests NONE Reflexed from [id number]  Final   Culture CULTURE REINCUBATED FOR BETTER GROWTH  Final   Report Status PENDING  Incomplete  Urine culture     Status: Abnormal   Collection Time: 04/03/16  7:14 PM  Result Value Ref Range Status   Specimen Description URINE, CLEAN CATCH  Final   Special Requests NONE  Final   Culture <10,000 COLONIES/mL INSIGNIFICANT GROWTH (A)  Final   Report Status 04/04/2016 FINAL  Final  Culture, blood (Routine X 2) w Reflex to ID Panel     Status: Abnormal (Preliminary result)   Collection Time: 04/03/16  7:17 PM  Result Value Ref Range Status   Specimen Description BLOOD RIGHT ANTECUBITAL  Final   Special Requests BOTTLES DRAWN AEROBIC AND ANAEROBIC 5CC  Final   Culture  Setup Time   Final    GRAM POSITIVE COCCI IN CLUSTERS ANAEROBIC BOTTLE ONLY CRITICAL RESULT CALLED TO, READ BACK BY AND VERIFIED WITH: J CARNEY PHARMD 2139 04/04/16 A BROWNING    Culture STAPHYLOCOCCUS SPECIES (COAGULASE NEGATIVE) (A)  Final   Report Status PENDING  Incomplete  Blood Culture ID Panel (Reflexed)     Status: Abnormal   Collection Time: 04/03/16  7:17 PM  Result Value Ref Range Status   Enterococcus species NOT DETECTED NOT DETECTED Final   Listeria monocytogenes NOT DETECTED NOT DETECTED Final    Staphylococcus species DETECTED (A) NOT DETECTED Final    Comment: Methicillin (oxacillin) resistant coagulase negative staphylococcus. Possible blood culture contaminant (unless isolated from more than one blood culture draw or clinical case suggests pathogenicity). No antibiotic treatment is indicated for blood  culture contaminants. CRITICAL RESULT CALLED TO, READ BACK BY AND VERIFIED WITH: J CARNEY PHARMD 2139 04/04/16 A BROWNING    Staphylococcus aureus NOT DETECTED NOT DETECTED Final   Methicillin resistance DETECTED (A) NOT DETECTED Final    Comment: CRITICAL RESULT CALLED TO, READ BACK BY AND VERIFIED WITH: J CARNEY PHARMD 2139 04/04/16 A BROWNING    Streptococcus species NOT DETECTED NOT DETECTED Final   Streptococcus agalactiae NOT DETECTED NOT DETECTED Final   Streptococcus pneumoniae NOT DETECTED NOT DETECTED Final   Streptococcus pyogenes NOT DETECTED NOT DETECTED Final   Acinetobacter baumannii NOT DETECTED NOT DETECTED Final   Enterobacteriaceae species NOT DETECTED NOT DETECTED Final   Enterobacter cloacae complex NOT DETECTED NOT DETECTED Final   Escherichia coli NOT DETECTED NOT DETECTED Final   Klebsiella oxytoca NOT DETECTED NOT DETECTED Final   Klebsiella pneumoniae NOT DETECTED NOT DETECTED Final   Proteus species NOT DETECTED NOT DETECTED Final   Serratia marcescens NOT DETECTED NOT DETECTED Final   Haemophilus influenzae NOT DETECTED NOT DETECTED Final   Neisseria meningitidis NOT DETECTED NOT DETECTED Final   Pseudomonas aeruginosa NOT DETECTED NOT DETECTED Final   Candida albicans NOT DETECTED NOT DETECTED Final   Candida glabrata NOT DETECTED NOT DETECTED Final   Candida krusei NOT DETECTED NOT DETECTED Final   Candida parapsilosis NOT DETECTED NOT DETECTED Final   Candida tropicalis NOT DETECTED NOT DETECTED Final  MRSA PCR Screening     Status: None   Collection Time: 04/03/16 10:01 PM  Result Value Ref Range Status   MRSA by PCR NEGATIVE NEGATIVE Final     Comment:  The GeneXpert MRSA Assay (FDA approved for NASAL specimens only), is one component of a comprehensive MRSA colonization surveillance program. It is not intended to diagnose MRSA infection nor to guide or monitor treatment for MRSA infections.        Radiology Studies: Dg Chest 2 View  Result Date: 04/03/2016 CLINICAL DATA:  Sore throat, unable to the eat, shortness of breath, wheezing, smoker EXAM: CHEST  2 VIEW COMPARISON:  None. FINDINGS: Diffuse bilateral reticulonodular opacities throughout all lobes of both lungs, suspicious for chronic interstitial lung disease. No definite superimposed focal pneumonia, collapse or consolidation. Normal heart size vascularity. No effusion or pneumothorax. Hiatal hernia suspected. Compression fracture of the L1 superior endplate, remote and unchanged compared 08/29/2015. IMPRESSION: Diffuse reticulonodular pulmonary opacities throughout both lungs, suspect chronic interstitial lung disease. No superimposed CHF or pneumonia Hiatal hernia suspected L1 superior endplate compression fracture, remote. Electronically Signed   By: Jerilynn Mages.  Shick M.D.   On: 04/03/2016 17:35      Scheduled Meds: . cefTRIAXone (ROCEPHIN)  IV  1 g Intravenous Q24H  . cyanocobalamin  1,000 mcg Subcutaneous Daily  . dextromethorphan-guaiFENesin  1 tablet Oral BID  . diphenhydrAMINE  25 mg Oral BID  . famotidine  20 mg Oral BID  . folic acid  1 mg Oral Daily  . loratadine  10 mg Oral Daily  . [START ON 04/06/2016] methylPREDNISolone (SOLU-MEDROL) injection  60 mg Intravenous Daily  . nicotine  21 mg Transdermal Daily  . pantoprazole (PROTONIX) IV  40 mg Intravenous Q12H  . polyethylene glycol-electrolytes  4,000 mL Oral Once  . sodium chloride flush  3 mL Intravenous Q12H  . sucralfate  1 g Oral TID WC & HS   Continuous Infusions:    LOS: 2 days    Time spent: 40 minutes   Dessa Phi, DO [facility] [email or web address] Password [email or web address] 04/05/2016,  1:02 PM

## 2016-04-05 NOTE — Anesthesia Postprocedure Evaluation (Signed)
Anesthesia Post Note  Patient: Darianna Amy  Procedure(s) Performed: Procedure(s) (LRB): ESOPHAGOGASTRODUODENOSCOPY (EGD) WITH PROPOFOL (Left)  Patient location during evaluation: PACU Anesthesia Type: MAC Level of consciousness: awake and alert Pain management: pain level controlled Vital Signs Assessment: post-procedure vital signs reviewed and stable Respiratory status: spontaneous breathing, nonlabored ventilation, respiratory function stable and patient connected to nasal cannula oxygen Cardiovascular status: stable and blood pressure returned to baseline Anesthetic complications: no       Last Vitals:  Vitals:   04/05/16 1040 04/05/16 1050  BP: (!) 131/109 118/75  Pulse: 83 71  Resp: 20 (!) 22  Temp:      Last Pain:  Vitals:   04/05/16 1038  TempSrc: Oral  PainSc:                  Catalina Gravel

## 2016-04-05 NOTE — H&P (View-Only) (Signed)
[facility] Gastroenterology Consultation Note  Referring Provider: Dr. Joette Catching Primary Care Physician:  [name], MD  Reason for Consultation:  Iron-defiency anemia  HPI: Amy Hall is a 51 y.o. female admitted with constellation of symptoms worrisome for allergic reaction.  As part of this evaluation, found to have iron-deficiency anemia, and thus GI was consulted.  She has few-day history of sores in her mouth and odynophagia and ill-defined periumbilical and lower abdominal discomfort (near constant, but worse after eating).  No overt blood in stool.  Consumes lots of NSAIDs.  No prior endoscopy or colonoscopy.  On prednisone chronically for rheumatoid arthritis.   Past Medical History:  Diagnosis Date  . Arthritis   . Asthma   . DDD (degenerative disc disease), lumbar   . Osteoporosis of lumbar spine   . Rheumatoid arthritis ([place])   . Sleep disorder     Past Surgical History:  Procedure Laterality Date  . ELBOW SURGERY      Prior to Admission medications   Medication Sig Start Date End Date Taking? Authorizing Provider  acetaminophen-codeine (TYLENOL #3) 300-30 MG tablet Take 1 tablet by mouth every 12 (twelve) hours as needed for moderate pain. 04/03/16  Yes Dawn Lazarus Gowda, MD  albuterol (PROVENTIL HFA;VENTOLIN HFA) 108 (90 Base) MCG/ACT inhaler Inhale 2 puffs into the lungs every 6 (six) hours as needed for wheezing or shortness of breath. 03/20/16  Yes [name], MD  cetirizine (ZYRTEC) 10 MG tablet Take 1 tablet (10 mg total) by mouth daily. 03/12/16  Yes [name], MD  ibuprofen (ADVIL,MOTRIN) 200 MG tablet Take 800 mg by mouth every 6 (six) hours as needed (swelling).   Yes Historical Provider, MD  predniSONE (DELTASONE) 10 MG tablet Take 1 tablet (10 mg total) by mouth 2 (two) times daily with a meal. 04/03/16  Yes Maren Reamer, MD  cyclobenzaprine (FLEXERIL) 5 MG tablet Take 1 tablet (5 mg total) by mouth 2 (two) times daily as needed for muscle spasms.  04/04/16   Maren Reamer, MD  pregabalin (LYRICA) 75 MG capsule Take 1 capsule (75 mg total) by mouth 2 (two) times daily. Patient not taking: Reported on 03/12/2016 01/02/16   Trula Slade, DPM  sulfamethoxazole-trimethoprim (BACTRIM DS) 800-160 MG tablet Take 1 tablet by mouth 2 (two) times daily. Patient not taking: Reported on 04/03/2016 03/12/16   [name], MD    Current Facility-Administered Medications  Medication Dose Route Frequency Provider Last Rate Last Dose  . 0.9 %  sodium chloride infusion   Intravenous Continuous Fredia Sorrow, MD 100 mL/hr at 04/03/16 1655    . acetaminophen (TYLENOL) tablet 650 mg  650 mg Oral Q6H PRN Ivor Costa, MD       Or  . acetaminophen (TYLENOL) suppository 650 mg  650 mg Rectal Q6H PRN Ivor Costa, MD      . cefTRIAXone (ROCEPHIN) 1 g in dextrose 5 % 50 mL IVPB  1 g Intravenous Q24H Ivor Costa, MD   1 g at 04/04/16 0017  . cyanocobalamin ((VITAMIN B-12)) injection 1,000 mcg  1,000 mcg Subcutaneous Daily Cherene Altes, MD   1,000 mcg at 04/04/16 1025  . cyclobenzaprine (FLEXERIL) tablet 5 mg  5 mg Oral BID PRN Ivor Costa, MD   5 mg at 04/04/16 0803  . dextromethorphan-guaiFENesin (MUCINEX DM) 30-600 MG per 12 hr tablet 1 tablet  1 tablet Oral BID Ivor Costa, MD   1 tablet at 04/04/16 1024  . diphenhydrAMINE (BENADRYL) capsule 25 mg  25 mg Oral BID Ivor Costa, MD   25 mg at 04/04/16 0800  . famotidine (PEPCID) tablet 20 mg  20 mg Oral BID Ivor Costa, MD   20 mg at 04/04/16 0800  . folic acid (FOLVITE) tablet 1 mg  1 mg Oral Daily Heath Lark, MD   1 mg at 04/04/16 1024  . ipratropium (ATROVENT) nebulizer solution 0.5 mg  0.5 mg Nebulization TID Ivor Costa, MD   0.5 mg at 04/04/16 0743  . levalbuterol (XOPENEX) nebulizer solution 1.25 mg  1.25 mg Nebulization TID Ivor Costa, MD   1.25 mg at 04/04/16 0743  . loratadine (CLARITIN) tablet 10 mg  10 mg Oral Daily Ivor Costa, MD   10 mg at 04/04/16 1024  . magic mouthwash w/lidocaine  5 mL Oral TID PRN  Cherene Altes, MD      . methylPREDNISolone sodium succinate (SOLU-MEDROL) 125 mg/2 mL injection 60 mg  60 mg Intravenous TID Ivor Costa, MD   60 mg at 04/04/16 1024  . nicotine (NICODERM CQ - dosed in mg/24 hours) patch 21 mg  21 mg Transdermal Daily Ivor Costa, MD   21 mg at 04/04/16 1025  . ondansetron (ZOFRAN) injection 4 mg  4 mg Intravenous Q8H PRN Ivor Costa, MD   4 mg at 04/03/16 2113  . pantoprazole (PROTONIX) 80 mg in sodium chloride 0.9 % 250 mL (0.32 mg/mL) infusion  8 mg/hr Intravenous Continuous Ivor Costa, MD 25 mL/hr at 04/04/16 0800 8 mg/hr at 04/04/16 0800  . predniSONE (DELTASONE) tablet 10 mg  10 mg Oral BID WC Ivor Costa, MD   10 mg at 04/04/16 0800  . senna-docusate (Senokot-S) tablet 1 tablet  1 tablet Oral QHS PRN Ivor Costa, MD      . sodium chloride flush (NS) 0.9 % injection 3 mL  3 mL Intravenous Q12H Ivor Costa, MD   3 mL at 04/04/16 1000  . zolpidem (AMBIEN) tablet 5 mg  5 mg Oral QHS PRN Ivor Costa, MD        Allergies as of 04/03/2016 - Review Complete 04/03/2016  Allergen Reaction Noted  . Gabapentin  11/04/2015    Family History  Problem Relation Age of Onset  . Anemia Mother   . Hypertension Father   . Heart disease Father   . Hypertension Sister     Social History   Social History  . Marital status: Divorced    Spouse name: N/A  . Number of children: N/A  . Years of education: N/A   Occupational History  . Not on file.   Social History Main Topics  . Smoking status: Current Every Day Smoker    Packs/day: 1.00    Types: Cigarettes  . Smokeless tobacco: Never Used  . Alcohol use 0.6 - 1.2 oz/week    1 - 2 Cans of beer per week     Comment: occ  . Drug use: No  . Sexual activity: Not on file   Other Topics Concern  . Not on file   Social History Narrative  . No narrative on file    Review of Systems: ROS Dr. Blaine Hamper 04/03/16 reviewed and I agree  Physical Exam: Vital signs in last 24 hours: Temp:  [97.5 F (36.4 C)-98.5 F (36.9 C)]  97.5 F (36.4 C) (03/21 1326) Pulse Rate:  [80-124] 86 (03/21 1326) Resp:  [11-28] 20 (03/21 0800) BP: (101-133)/(63-99) 123/81 (03/21 1326) SpO2:  [93 %-100 %] 94 % (03/21 1326) Weight:  [65.9 kg (145  lb 3.2 oz)-71.6 kg (157 lb 14.4 oz)] 71.6 kg (157 lb 14.4 oz) (03/20 2208) Last BM Date: 04/03/16 General:   Alert,  Older-appearing than stated age, NAD Head:  Normocephalic and atraumatic. Eyes:  Sclera clear, no icterus.   Conjunctiva pale Ears:  Normal auditory acuity. Nose:  No deformity, discharge,  or lesions. Mouth:  I did not appreciate any significant aphthous ulcers in mouth; No deformity or lesions.  Oropharynx pale and dry Neck:  Supple; no masses or thyromegaly. Lungs:  Clear throughout to auscultation.   No wheezes, crackles, or rhonchi. No acute distress. Heart:  Regular rate and rhythm; no murmurs, clicks, rubs,  or gallops. Abdomen:  Soft,non-distended, generalized abdominal tenderness without peritonitis. No masses, hepatosplenomegaly or hernias noted. Normal bowel sounds, without guarding, and without rebound.     Msk:  Symmetrical without gross deformities. Normal posture. Pulses:  Normal pulses noted. Extremities:  Without clubbing or edema. Neurologic:  Alert and  oriented x4; diffusely weak, otherwise grossly normal neurologically. Skin:  Intact without significant lesions or rashes. Psych:  Alert and cooperative. Normal mood and affect.   Lab Results:  Recent Labs  04/03/16 1624 04/04/16 0616 04/04/16 0951  WBC 17.5* 13.8* 15.3*  HGB 5.7* 7.3* 7.0*  HCT 20.7* 25.2* 24.6*  PLT 589* 457* 466*   BMET  Recent Labs  04/03/16 1624 04/04/16 0616  NA 140 137  K 3.3* 4.0  CL 105 105  CO2 26 24  GLUCOSE 117* 138*  BUN 10 6  CREATININE 0.68 0.56  CALCIUM 8.7* 8.1*   LFT  Recent Labs  04/03/16 1624  PROT 5.8*  ALBUMIN 2.7*  AST 21  ALT 25  ALKPHOS 48  BILITOT 0.3   PT/INR  Recent Labs  04/03/16 2044  LABPROT 13.5  INR 1.02     Studies/Results: Dg Chest 2 View  Result Date: 04/03/2016 CLINICAL DATA:  Sore throat, unable to the eat, shortness of breath, wheezing, smoker EXAM: CHEST  2 VIEW COMPARISON:  None. FINDINGS: Diffuse bilateral reticulonodular opacities throughout all lobes of both lungs, suspicious for chronic interstitial lung disease. No definite superimposed focal pneumonia, collapse or consolidation. Normal heart size vascularity. No effusion or pneumothorax. Hiatal hernia suspected. Compression fracture of the L1 superior endplate, remote and unchanged compared 08/29/2015. IMPRESSION: Diffuse reticulonodular pulmonary opacities throughout both lungs, suspect chronic interstitial lung disease. No superimposed CHF or pneumonia Hiatal hernia suspected L1 superior endplate compression fracture, remote. Electronically Signed   By: Jerilynn Mages.  Shick M.D.   On: 04/03/2016 17:35   Impression:  1.  Generalized abdominal pain. 2.  Odynophagia. 3.  Iron-deficiency anemia.  Plan:  1.  Endoscopy and colonoscopy for further evaluation (if she can't tolerate the bowel prep, then would do endoscopy alone). 2.  No ASA/NsAIDs. 3.  PPI. 4.  Clear liquids, NPO after midnight. 5.  Next step in management is pending endoscopy and colonoscopy findings. 6.  [facility] will follow.   LOS: 1 day   Joquan Lotz Jerilynn Mages  04/04/2016, 1:27 PM  Pager [phone] If no answer or after 5 PM call [phone]

## 2016-04-05 NOTE — Anesthesia Preprocedure Evaluation (Signed)
Anesthesia Evaluation  Patient identified by MRN, date of birth, ID band Patient awake    Reviewed: Allergy & Precautions, NPO status , Patient's Chart, lab work & pertinent test results  Airway Mallampati: II  TM Distance: >3 FB Neck ROM: Full    Dental  (+) Teeth Intact, Dental Advisory Given   Pulmonary asthma , Current Smoker,    Pulmonary exam normal breath sounds clear to auscultation       Cardiovascular negative cardio ROS Normal cardiovascular exam Rhythm:Regular Rate:Normal     Neuro/Psych negative neurological ROS     GI/Hepatic negative GI ROS, Neg liver ROS, Odynophagia    Endo/Other  negative endocrine ROS  Renal/GU negative Renal ROS     Musculoskeletal  (+) Arthritis , Rheumatoid disorders,    Abdominal   Peds  Hematology  (+) Blood dyscrasia, anemia ,   Anesthesia Other Findings Day of surgery medications reviewed with the patient.  Reproductive/Obstetrics                             Anesthesia Physical Anesthesia Plan  ASA: II  Anesthesia Plan: MAC   Post-op Pain Management:    Induction: Intravenous  Airway Management Planned: Nasal Cannula  Additional Equipment:   Intra-op Plan:   Post-operative Plan:   Informed Consent: I have reviewed the patients History and Physical, chart, labs and discussed the procedure including the risks, benefits and alternatives for the proposed anesthesia with the patient or authorized representative who has indicated his/her understanding and acceptance.   Dental advisory given  Plan Discussed with: CRNA and Anesthesiologist  Anesthesia Plan Comments: (Discussed risks/benefits/alternatives to MAC sedation including need for ventilatory support, hypotension, need for conversion to general anesthesia.  All patient questions answered.  Patient/guardian wishes to proceed.)        Anesthesia Quick Evaluation

## 2016-04-05 NOTE — Care Management Note (Signed)
Case Management Note  Patient Details  Name: Amy Hall MRN: [id number] Date of Birth: 07-03-1965  Subjective/Objective:    From home with her daughter Carron Brazen who is 51 yo,  Presents with symptomatic anemia, RA, Sepsis, Asthma ex, UTI, N/V, GIB.  She is established at the [facility] clinic already, if she is discharged tomorrow she will go to clinic to get ast with her medications.  If she is dc over the weekend she will need ast with Match Letter.  Patient has a follow up apt on 3/27 at 10 am at the [facility] clinic.                 Action/Plan:   Expected Discharge Date:                  Expected Discharge Plan:  Home/Self Care  In-House Referral:     Discharge planning Services  CM Consult, Medication Assistance, [facility]  Post Acute Care Choice:    Choice offered to:     DME Arranged:    DME Agency:     HH Arranged:    HH Agency:     Status of Service:  In process, will continue to follow  If discussed at Long Length of Stay Meetings, dates discussed:    Additional Comments:  Zenon Mayo, RN 04/05/2016, 3:46 PM

## 2016-04-06 ENCOUNTER — Other Ambulatory Visit: Payer: Self-pay | Admitting: Hematology and Oncology

## 2016-04-06 ENCOUNTER — Encounter (HOSPITAL_COMMUNITY): Payer: Self-pay | Admitting: Gastroenterology

## 2016-04-06 DIAGNOSIS — D518 Other vitamin B12 deficiency anemias: Secondary | ICD-10-CM

## 2016-04-06 DIAGNOSIS — D519 Vitamin B12 deficiency anemia, unspecified: Secondary | ICD-10-CM | POA: Insufficient documentation

## 2016-04-06 DIAGNOSIS — K2901 Acute gastritis with bleeding: Secondary | ICD-10-CM

## 2016-04-06 DIAGNOSIS — D500 Iron deficiency anemia secondary to blood loss (chronic): Secondary | ICD-10-CM | POA: Insufficient documentation

## 2016-04-06 DIAGNOSIS — E538 Deficiency of other specified B group vitamins: Secondary | ICD-10-CM

## 2016-04-06 LAB — CBC
HCT: 28.7 % — ABNORMAL LOW (ref 36.0–46.0)
HEMOGLOBIN: 8.3 g/dL — AB (ref 12.0–15.0)
MCH: 22.5 pg — AB (ref 26.0–34.0)
MCHC: 28.9 g/dL — ABNORMAL LOW (ref 30.0–36.0)
MCV: 77.8 fL — ABNORMAL LOW (ref 78.0–100.0)
PLATELETS: 476 10*3/uL — AB (ref 150–400)
RBC: 3.69 MIL/uL — AB (ref 3.87–5.11)
RDW: 18 % — ABNORMAL HIGH (ref 11.5–15.5)
WBC: 13.7 10*3/uL — AB (ref 4.0–10.5)

## 2016-04-06 LAB — CULTURE, BLOOD (ROUTINE X 2)

## 2016-04-06 LAB — BASIC METABOLIC PANEL
ANION GAP: 9 (ref 5–15)
BUN: 6 mg/dL (ref 6–20)
CHLORIDE: 103 mmol/L (ref 101–111)
CO2: 27 mmol/L (ref 22–32)
Calcium: 8.2 mg/dL — ABNORMAL LOW (ref 8.9–10.3)
Creatinine, Ser: 0.69 mg/dL (ref 0.44–1.00)
GFR calc Af Amer: >60 mL/min (ref >60)
Glucose, Bld: 96 mg/dL (ref 65–99)
POTASSIUM: 3.4 mmol/L — AB (ref 3.5–5.1)
SODIUM: 139 mmol/L (ref 135–145)

## 2016-04-06 LAB — URINE CYTOLOGY ANCILLARY ONLY: CANDIDA VAGINITIS: NEGATIVE

## 2016-04-06 LAB — GLUCOSE, CAPILLARY
GLUCOSE-CAPILLARY: 92 mg/dL (ref 65–99)
Glucose-Capillary: 94 mg/dL (ref 65–99)

## 2016-04-06 LAB — CULTURE, GROUP A STREP (THRC)

## 2016-04-06 MED ORDER — DM-GUAIFENESIN ER 30-600 MG PO TB12: 1.0000 | ORAL_TABLET | Freq: Two times a day (BID) | ORAL | 0 refills | Status: DC | PRN

## 2016-04-06 MED ORDER — POLYETHYLENE GLYCOL 3350 17 G PO PACK: 17.0000 g | PACK | Freq: Every day | ORAL | 0 refills | Status: DC

## 2016-04-06 MED ORDER — MAGIC MOUTHWASH W/LIDOCAINE
5.0000 mL | Freq: Three times a day (TID) | ORAL | 0 refills | Status: DC | PRN
Start: 2016-04-06 — End: 2016-10-06

## 2016-04-06 MED ORDER — POTASSIUM CHLORIDE 20 MEQ/15ML (10%) PO SOLN
40.0000 meq | Freq: Once | ORAL | Status: AC
Start: 2016-04-06 — End: 2016-04-06
  Administered 2016-04-06: 40 meq via ORAL
  Filled 2016-04-06: qty 30

## 2016-04-06 MED ORDER — PANTOPRAZOLE SODIUM 40 MG PO TBEC: 40.0000 mg | DELAYED_RELEASE_TABLET | Freq: Two times a day (BID) | ORAL | 0 refills | Status: DC

## 2016-04-06 MED ORDER — NEPHROCAPS 1 MG PO CAPS: 1.0000 | ORAL_CAPSULE | Freq: Every day | ORAL | 0 refills | Status: DC

## 2016-04-06 MED ORDER — SUCRALFATE 1 GM/10ML PO SUSP: 1.0000 g | Freq: Three times a day (TID) | ORAL | 0 refills | Status: DC

## 2016-04-06 MED FILL — POLYETHYLENE GLYCOL 3350: 14 days supply | Qty: 255 | Fill #0

## 2016-04-06 MED FILL — CARAFATE 1 GM/10 ML SUSP: 1 | 10 days supply | Qty: 420 | Fill #0

## 2016-04-06 MED FILL — ACETAMINOPHEN/COD #3 TABLET: 300-30 | 30 days supply | Qty: 60 | Fill #0

## 2016-04-06 NOTE — Progress Notes (Signed)
Amy Hall to be D/C'd home per MD order.  Discussed with the patient and all questions fully answered.  VSS, Skin clean, dry and intact without evidence of skin break down, no evidence of skin tears noted. IV catheter discontinued intact. Site without signs and symptoms of complications. Dressing and pressure applied.  An After Visit Summary was printed and given to the patient. Patient received prescription.  D/c education completed with patient/family including follow up instructions, medication list, d/c activities limitations if indicated, with other d/c instructions as indicated by MD - patient able to verbalize understanding, all questions fully answered.   Patient instructed to return to ED, call 911, or call MD for any changes in condition.   Patient escorted via [place], and D/C home via private auto.  [name] 04/06/2016 3:10 PM

## 2016-04-06 NOTE — Care Management Note (Signed)
Case Management Note  Patient Details  Name: Donyea Gafford MRN: [id number] Date of Birth: 15-Jan-1966  Subjective/Objective:        Admitted with symptomatic anemia.            Action/Plan: Plan is to d/c to home today. Post hosptial follow scheduled for 3/36/2018 with Zettie Pho NP at 10:00 am. Match Letter given and explained to pt  to assist with medication cost,  Pt states can't afford medication.  Expected Discharge Date:                  Expected Discharge Plan:  Home/Self Care  In-House Referral:     Discharge planning Services  CM Consult, Medication Assistance, [facility]  Post Acute Care Choice:    Choice offered to:     DME Arranged:    DME Agency:     HH Arranged:    HH Agency:     Status of Service:  completed  If discussed at [employer], dates discussed:    Additional Comments:  Sharin Mons, RN 04/06/2016, 10:14 AM

## 2016-04-06 NOTE — Progress Notes (Signed)
Subjective: Improving swallowing.  Less odynophagia. Tolerating soft diet.  Objective: Vital signs in last 24 hours: Temp:  [97.5 F (36.4 C)-98.7 F (37.1 C)] 97.5 F (36.4 C) (03/23 0653) Pulse Rate:  [72-82] 72 (03/23 0653) Resp:  [17-21] 19 (03/23 0653) BP: (121-137)/(75-92) 137/85 (03/23 0653) SpO2:  [94 %-100 %] 95 % (03/23 0653) Weight change:  Last BM Date: 03/29/16  PE: GEN:  NAD  Lab Results: CBC    Component Value Date/Time   WBC 13.7 (H) 04/06/2016 0357   RBC 3.69 (L) 04/06/2016 0357   HGB 8.3 (L) 04/06/2016 0357   HCT 28.7 (L) 04/06/2016 0357   PLT 476 (H) 04/06/2016 0357   MCV 77.8 (L) 04/06/2016 0357   MCH 22.5 (L) 04/06/2016 0357   MCHC 28.9 (L) 04/06/2016 0357   RDW 18.0 (H) 04/06/2016 0357   LYMPHSABS 1.6 04/03/2016 1624   MONOABS 1.4 (H) 04/03/2016 1624   EOSABS 0.0 04/03/2016 1624   BASOSABS 0.0 04/03/2016 1624   CMP     Component Value Date/Time   NA 139 04/06/2016 0357   K 3.4 (L) 04/06/2016 0357   CL 103 04/06/2016 0357   CO2 27 04/06/2016 0357   GLUCOSE 96 04/06/2016 0357   BUN 6 04/06/2016 0357   CREATININE 0.69 04/06/2016 0357   CREATININE 0.57 09/21/2015 0946   CALCIUM 8.2 (L) 04/06/2016 0357   PROT 5.1 (L) 04/05/2016 0345   ALBUMIN 2.4 (L) 04/05/2016 0345   AST 33 04/05/2016 0345   ALT 65 (H) 04/05/2016 0345   ALKPHOS 48 04/05/2016 0345   BILITOT 0.6 04/05/2016 0345   GFRNONAA >60 04/06/2016 0357   GFRNONAA >89 09/02/2015 1227   GFRAA >60 04/06/2016 0357   GFRAA >89 09/02/2015 1227   Assessment:  1.  Odynophagia due to significant esophagitis, reflux-appearing. 2.  Iron-deficiency anemia.  Unclear whether can be explained solely by patient's esophagitis and hiatal hernia.  Plan:  1.  Pantoprazole 40 mg po bid, now and upon discharge. 2.  Sucralfate suspension 1 grams po QAC/QHS, now and upon discharge. 3.  Can d/c home today; can follow-up with me in 4-6 weeks.  Will need repeat EGD in 2-3 months and also needs  colonoscopy. 4.  Case reviewed with Dr. Carolin Sicks.  WIll sign-off; please call with questions; thank you for the consultation.   Amy Hall 04/06/2016, 11:28 AM   Pager [phone] If no answer or after 5 PM call [phone]

## 2016-04-06 NOTE — Discharge Summary (Addendum)
Physician Discharge Summary  Amy Hall ZOX:[id number] DOB: [birth year] DOA: 04/03/2016  PCP: [name], MD  Admit date: 04/03/2016 Discharge date: 04/06/2016  Admitted From:home Disposition:home  Recommendations for Outpatient Follow-up:  1. Follow up with PCP in 1-2 weeks 2. Please obtain BMP/CBC in one week  Home Health:no Equipment/Devices:no Discharge Condition:stable CODE STATUS:full Diet recommendation:heart healthy  Brief/Interim Summary:51 year old female with history of rheumatoid arthritis on chronic prednisone, asthma, arthritis, tobacco abuse presented with dysuria, shortness of breath, wheezing and cough. Patient was found to have blister in the mouth. She has not notedany blood in her stool or in vomitus. She has not hadmenstrual period for more than a year.Pt takes prednisone 10 mg twice a day chronically for rheumatoid arthritis and also frequently takesibuprofen (4-5 pills every 4-5 hours for years). In the ED she was found to have a positive FOBT and a hemoglobin of 5.7 (was 12.2 on 818/17).   Acute blood loss anemia: Status post upper GI endoscopy with grade D esophagitis. Patient was recommended to avoid NSAIDs. Continue on Protonix, sucralfate. Patient received 2 units of red blood cell transfusion. No further bleeding. Hemoglobin is stable. I discussed with Dr. Paulita Fujita from GI recommended outpatient follow-up for possible further investigation. Okay to discharge today.  Acute arthritis in remission on admission: A stable now.  Dysuria, without UTI on UA: Reportedly patient received 3 days of antibiotics  History of rheumatoid arthritis: Continue oral prednisone on discharge. I recommended patient to follow-up with PCP and discuss for rheumatology referral.  Oral blisters/ facial swelling: Unknown etiology: Patient has no blister or swelling today. Clinically improved. Treated with IV steroid. On oral prednisone on discharge.  Reactive thrombocytosis,  vitamin B12 deficiency: Follow up with hematologist outpatient. Discharge with oral vitamin.  The patient is clinically stable. Mild hypokalemia in the setting of decreased oral intake. Repeated oral potassium. Recommended outpatient follow-up and to monitor lab.   Discharge Diagnoses:  Principal Problem:   Symptomatic anemia Active Problems:   Rheumatoid arthritis (HCC)   Asthma exacerbation   Tobacco abuse   UTI (urinary tract infection)   Allergic reaction   Severe anemia   Nausea & vomiting   GI bleed    Discharge Instructions  Discharge Instructions    Call MD for:  difficulty breathing, headache or visual disturbances    Complete by:  As directed    Call MD for:  extreme fatigue    Complete by:  As directed    Call MD for:  hives    Complete by:  As directed    Call MD for:  persistant dizziness or light-headedness    Complete by:  As directed    Call MD for:  persistant nausea and vomiting    Complete by:  As directed    Call MD for:  severe uncontrolled pain    Complete by:  As directed    Call MD for:  temperature >100.4    Complete by:  As directed    Diet - low sodium heart healthy    Complete by:  As directed    Increase activity slowly    Complete by:  As directed      Allergies as of 04/06/2016      Reactions   Gabapentin    More pain       Medication List    STOP taking these medications   ibuprofen 200 MG tablet Commonly known as:  ADVIL,MOTRIN   pregabalin 75 MG capsule Commonly known as:  LYRICA  sulfamethoxazole-trimethoprim 800-160 MG tablet Commonly known as:  BACTRIM DS     TAKE these medications   acetaminophen-codeine 300-30 MG tablet Commonly known as:  TYLENOL #3 Take 1 tablet by mouth every 12 (twelve) hours as needed for moderate pain.   albuterol 108 (90 Base) MCG/ACT inhaler Commonly known as:  PROVENTIL HFA;VENTOLIN HFA Inhale 2 puffs into the lungs every 6 (six) hours as needed for wheezing or shortness of breath.    b complex-C-folic acid 1 MG capsule Take 1 capsule (1 mg total) by mouth daily.   cetirizine 10 MG tablet Commonly known as:  ZYRTEC Take 1 tablet (10 mg total) by mouth daily.   cyclobenzaprine 5 MG tablet Commonly known as:  FLEXERIL Take 1 tablet (5 mg total) by mouth 2 (two) times daily as needed for muscle spasms.   dextromethorphan-guaiFENesin 30-600 MG 12hr tablet Commonly known as:  MUCINEX DM Take 1 tablet by mouth 2 (two) times daily as needed for cough.   magic mouthwash w/lidocaine Soln Take 5 mLs by mouth 3 (three) times daily as needed for mouth pain.   pantoprazole 40 MG tablet Commonly known as:  PROTONIX Take 1 tablet (40 mg total) by mouth 2 (two) times daily.   polyethylene glycol packet Commonly known as:  MIRALAX / GLYCOLAX Take 17 g by mouth daily. Start taking on:  04/07/2016   predniSONE 10 MG tablet Commonly known as:  DELTASONE Take 1 tablet (10 mg total) by mouth 2 (two) times daily with a meal.   sucralfate 1 GM/10ML suspension Commonly known as:  CARAFATE Take 10 mLs (1 g total) by mouth 4 (four) times daily -  with meals and at bedtime.      Follow-up Information    [facility] Call on 04/09/2016.   Why:  Post hosptial follow scheduled for 3/36/2018 with Zettie Pho NP at 10:00 am Contact information: [address] [place] [address] [phone]       Landry Dyke, MD. Schedule an appointment as soon as possible for a visit in 1 month(s).   Specialty:  Gastroenterology Contact information: [address] [place] [address] [phone]        Heath Lark, MD Follow up on 04/18/2016.   Specialty:  Hematology and Oncology Why:  please call to confirm the date and time. Contact information: [place] [address] [phone]          Allergies  Allergen Reactions  . Gabapentin     More pain      Consultations: GI Hematologist  Procedures/Studies: Upper GI endoscopy  Subjective: Patient was seen and examined at bedside. She reported doing well. Feels hungry and advance diet. Tolerating well. Denied nausea vomiting or abdominal pain. No shortness of breath or chest pain.  Discharge Exam: Vitals:   04/05/16 2229 04/06/16 0653  BP: 129/75 137/85  Pulse: 82 72  Resp: 18 19  Temp: 97.5 F (36.4 C) 97.5 F (36.4 C)   Vitals:   04/05/16 1700 04/05/16 1748 04/05/16 2229 04/06/16 0653  BP: 122/77 134/88 129/75 137/85  Pulse: 77 79 82 72  Resp: 20 17 18 19   Temp: 98.7 F (37.1 C) 98.6 F (37 C) 97.5 F (36.4 C) 97.5 F (36.4 C)  TempSrc: Oral     SpO2: 99% 98% 100% 95%  Weight:      Height:        General: Pt is alert, awake, not in  acute distress, No facial swelling and blisters Cardiovascular: RRR, S1/S2 +, no rubs, no gallops Respiratory: CTA bilaterally, no wheezing, no rhonchi Abdominal: Soft, NT, ND, bowel sounds + Extremities: no edema, no cyanosis    The results of significant diagnostics from this hospitalization (including imaging, microbiology, ancillary and laboratory) are listed below for reference.     Microbiology: Recent Results (from the past 240 hour(s))  Rapid strep screen (not at [facility])     Status: None   Collection Time: 04/03/16  5:01 PM  Result Value Ref Range Status   Streptococcus, Group A Screen (Direct) NEGATIVE NEGATIVE Final    Comment: (NOTE) A Rapid Antigen test may result negative if the antigen level in the sample is below the detection level of this test. The FDA has not cleared this test as a stand-alone test therefore the rapid antigen negative result has reflexed to a Group A Strep culture.   Culture, group A strep     Status: None   Collection Time: 04/03/16  5:01 PM  Result Value Ref Range Status   Specimen Description THROAT  Final   Special Requests NONE Reflexed from [id number]  Final   Culture NO GROUP A STREP  (S.PYOGENES) ISOLATED  Final   Report Status 04/06/2016 FINAL  Final  Urine culture     Status: Abnormal   Collection Time: 04/03/16  7:14 PM  Result Value Ref Range Status   Specimen Description URINE, CLEAN CATCH  Final   Special Requests NONE  Final   Culture <10,000 COLONIES/mL INSIGNIFICANT GROWTH (A)  Final   Report Status 04/04/2016 FINAL  Final  Culture, blood (Routine X 2) w Reflex to ID Panel     Status: Abnormal   Collection Time: 04/03/16  7:17 PM  Result Value Ref Range Status   Specimen Description BLOOD RIGHT ANTECUBITAL  Final   Special Requests BOTTLES DRAWN AEROBIC AND ANAEROBIC 5CC  Final   Culture  Setup Time   Final    GRAM POSITIVE COCCI IN CLUSTERS ANAEROBIC BOTTLE ONLY CRITICAL RESULT CALLED TO, READ BACK BY AND VERIFIED WITH: J CARNEY PHARMD 2139 04/04/16 A BROWNING    Culture (A)  Final    STAPHYLOCOCCUS SPECIES (COAGULASE NEGATIVE) THE SIGNIFICANCE OF ISOLATING THIS ORGANISM FROM A SINGLE VENIPUNCTURE CANNOT BE PREDICTED WITHOUT FURTHER CLINICAL AND CULTURE CORRELATION. SUSCEPTIBILITIES AVAILABLE ONLY ON REQUEST.    Report Status 04/06/2016 FINAL  Final  Blood Culture ID Panel (Reflexed)     Status: Abnormal   Collection Time: 04/03/16  7:17 PM  Result Value Ref Range Status   Enterococcus species NOT DETECTED NOT DETECTED Final   Listeria monocytogenes NOT DETECTED NOT DETECTED Final   Staphylococcus species DETECTED (A) NOT DETECTED Final    Comment: Methicillin (oxacillin) resistant coagulase negative staphylococcus. Possible blood culture contaminant (unless isolated from more than one blood culture draw or clinical case suggests pathogenicity). No antibiotic treatment is indicated for blood  culture contaminants. CRITICAL RESULT CALLED TO, READ BACK BY AND VERIFIED WITH: J CARNEY PHARMD 2139 04/04/16 A BROWNING    Staphylococcus aureus NOT DETECTED NOT DETECTED Final   Methicillin resistance DETECTED (A) NOT DETECTED Final    Comment: CRITICAL RESULT  CALLED TO, READ BACK BY AND VERIFIED WITH: J CARNEY PHARMD 2139 04/04/16 A BROWNING    Streptococcus species NOT DETECTED NOT DETECTED Final   Streptococcus agalactiae NOT DETECTED NOT DETECTED Final   Streptococcus pneumoniae NOT DETECTED NOT DETECTED Final   Streptococcus pyogenes NOT DETECTED NOT DETECTED Final  Acinetobacter baumannii NOT DETECTED NOT DETECTED Final   Enterobacteriaceae species NOT DETECTED NOT DETECTED Final   Enterobacter cloacae complex NOT DETECTED NOT DETECTED Final   Escherichia coli NOT DETECTED NOT DETECTED Final   Klebsiella oxytoca NOT DETECTED NOT DETECTED Final   Klebsiella pneumoniae NOT DETECTED NOT DETECTED Final   Proteus species NOT DETECTED NOT DETECTED Final   Serratia marcescens NOT DETECTED NOT DETECTED Final   Haemophilus influenzae NOT DETECTED NOT DETECTED Final   Neisseria meningitidis NOT DETECTED NOT DETECTED Final   Pseudomonas aeruginosa NOT DETECTED NOT DETECTED Final   Candida albicans NOT DETECTED NOT DETECTED Final   Candida glabrata NOT DETECTED NOT DETECTED Final   Candida krusei NOT DETECTED NOT DETECTED Final   Candida parapsilosis NOT DETECTED NOT DETECTED Final   Candida tropicalis NOT DETECTED NOT DETECTED Final  MRSA PCR Screening     Status: None   Collection Time: 04/03/16 10:01 PM  Result Value Ref Range Status   MRSA by PCR NEGATIVE NEGATIVE Final    Comment:        The GeneXpert MRSA Assay (FDA approved for NASAL specimens only), is one component of a comprehensive MRSA colonization surveillance program. It is not intended to diagnose MRSA infection nor to guide or monitor treatment for MRSA infections.      Labs: BNP (last 3 results) No results for input(s): BNP in the last 8760 hours. Basic Metabolic Panel:  Recent Labs Lab 04/03/16 1624 04/04/16 0616 04/05/16 0345 04/06/16 0357  NA 140 137 140 139  K 3.3* 4.0 4.3 3.4*  CL 105 105 107 103  CO2 26 24 24 27   GLUCOSE 117* 138* 141* 96  BUN 10 6  5* 6  CREATININE 0.68 0.56 0.72 0.69  CALCIUM 8.7* 8.1* 8.3* 8.2*  MG  --  1.8  --   --    Liver Function Tests:  Recent Labs Lab 04/03/16 1624 04/05/16 0345  AST 21 33  ALT 25 65*  ALKPHOS 48 48  BILITOT 0.3 0.6  PROT 5.8* 5.1*  ALBUMIN 2.7* 2.4*    Recent Labs Lab 04/03/16 2058  LIPASE 26   No results for input(s): AMMONIA in the last 168 hours. CBC:  Recent Labs Lab 04/03/16 1624 04/04/16 0616 04/04/16 0951 04/04/16 1436 04/05/16 0345 04/06/16 0357  WBC 17.5* 13.8* 15.3* 18.9* 13.3* 13.7*  NEUTROABS 14.5*  --   --   --   --   --   HGB 5.7* 7.3* 7.0* 7.5* 7.1* 8.3*  HCT 20.7* 25.2* 24.6* 25.2* 25.0* 28.7*  MCV 73.9* 76.1* 75.9* 75.9* 76.9* 77.8*  PLT 589* 457* 466* 470* 459* 476*   Cardiac Enzymes: No results for input(s): CKTOTAL, CKMB, CKMBINDEX, TROPONINI in the last 168 hours. BNP: Invalid input(s): POCBNP CBG:  Recent Labs Lab 04/04/16 0810 04/05/16 0740 04/05/16 1228 04/06/16 0812  GLUCAP 129* 119* 93 92   D-Dimer No results for input(s): DDIMER in the last 72 hours. Hgb A1c No results for input(s): HGBA1C in the last 72 hours. Lipid Profile No results for input(s): CHOL, HDL, LDLCALC, TRIG, CHOLHDL, LDLDIRECT in the last 72 hours. Thyroid function studies No results for input(s): TSH, T4TOTAL, T3FREE, THYROIDAB in the last 72 hours.  Invalid input(s): FREET3 Anemia work up  Recent Labs  04/03/16 2044  VITAMINB12 191  FERRITIN 6*  TIBC 315  IRON <5*  RETICCTPCT 3.0   Urinalysis    Component Value Date/Time   COLORURINE YELLOW 04/05/2016 1150   APPEARANCEUR HAZY (A) 04/05/2016  1150   LABSPEC 1.013 04/05/2016 1150   PHURINE 6.0 04/05/2016 1150   GLUCOSEU NEGATIVE 04/05/2016 1150   HGBUR NEGATIVE 04/05/2016 1150   BILIRUBINUR NEGATIVE 04/05/2016 1150   BILIRUBINUR negative 04/03/2016 1446   KETONESUR NEGATIVE 04/05/2016 1150   PROTEINUR NEGATIVE 04/05/2016 1150   UROBILINOGEN 0.2 04/03/2016 1446   NITRITE NEGATIVE  04/05/2016 1150   LEUKOCYTESUR MODERATE (A) 04/05/2016 1150   Sepsis Labs Invalid input(s): PROCALCITONIN,  WBC,  LACTICIDVEN Microbiology Recent Results (from the past 240 hour(s))  Rapid strep screen (not at [facility])     Status: None   Collection Time: 04/03/16  5:01 PM  Result Value Ref Range Status   Streptococcus, Group A Screen (Direct) NEGATIVE NEGATIVE Final    Comment: (NOTE) A Rapid Antigen test may result negative if the antigen level in the sample is below the detection level of this test. The FDA has not cleared this test as a stand-alone test therefore the rapid antigen negative result has reflexed to a Group A Strep culture.   Culture, group A strep     Status: None   Collection Time: 04/03/16  5:01 PM  Result Value Ref Range Status   Specimen Description THROAT  Final   Special Requests NONE Reflexed from [id number]  Final   Culture NO GROUP A STREP (S.PYOGENES) ISOLATED  Final   Report Status 04/06/2016 FINAL  Final  Urine culture     Status: Abnormal   Collection Time: 04/03/16  7:14 PM  Result Value Ref Range Status   Specimen Description URINE, CLEAN CATCH  Final   Special Requests NONE  Final   Culture <10,000 COLONIES/mL INSIGNIFICANT GROWTH (A)  Final   Report Status 04/04/2016 FINAL  Final  Culture, blood (Routine X 2) w Reflex to ID Panel     Status: Abnormal   Collection Time: 04/03/16  7:17 PM  Result Value Ref Range Status   Specimen Description BLOOD RIGHT ANTECUBITAL  Final   Special Requests BOTTLES DRAWN AEROBIC AND ANAEROBIC 5CC  Final   Culture  Setup Time   Final    GRAM POSITIVE COCCI IN CLUSTERS ANAEROBIC BOTTLE ONLY CRITICAL RESULT CALLED TO, READ BACK BY AND VERIFIED WITH: J CARNEY PHARMD 2139 04/04/16 A BROWNING    Culture (A)  Final    STAPHYLOCOCCUS SPECIES (COAGULASE NEGATIVE) THE SIGNIFICANCE OF ISOLATING THIS ORGANISM FROM A SINGLE VENIPUNCTURE CANNOT BE PREDICTED WITHOUT FURTHER CLINICAL AND CULTURE CORRELATION. SUSCEPTIBILITIES  AVAILABLE ONLY ON REQUEST.    Report Status 04/06/2016 FINAL  Final  Blood Culture ID Panel (Reflexed)     Status: Abnormal   Collection Time: 04/03/16  7:17 PM  Result Value Ref Range Status   Enterococcus species NOT DETECTED NOT DETECTED Final   Listeria monocytogenes NOT DETECTED NOT DETECTED Final   Staphylococcus species DETECTED (A) NOT DETECTED Final    Comment: Methicillin (oxacillin) resistant coagulase negative staphylococcus. Possible blood culture contaminant (unless isolated from more than one blood culture draw or clinical case suggests pathogenicity). No antibiotic treatment is indicated for blood  culture contaminants. CRITICAL RESULT CALLED TO, READ BACK BY AND VERIFIED WITH: J CARNEY PHARMD 2139 04/04/16 A BROWNING    Staphylococcus aureus NOT DETECTED NOT DETECTED Final   Methicillin resistance DETECTED (A) NOT DETECTED Final    Comment: CRITICAL RESULT CALLED TO, READ BACK BY AND VERIFIED WITH: J CARNEY PHARMD 2139 04/04/16 A BROWNING    Streptococcus species NOT DETECTED NOT DETECTED Final   Streptococcus agalactiae NOT DETECTED NOT DETECTED Final  Streptococcus pneumoniae NOT DETECTED NOT DETECTED Final   Streptococcus pyogenes NOT DETECTED NOT DETECTED Final   Acinetobacter baumannii NOT DETECTED NOT DETECTED Final   Enterobacteriaceae species NOT DETECTED NOT DETECTED Final   Enterobacter cloacae complex NOT DETECTED NOT DETECTED Final   Escherichia coli NOT DETECTED NOT DETECTED Final   Klebsiella oxytoca NOT DETECTED NOT DETECTED Final   Klebsiella pneumoniae NOT DETECTED NOT DETECTED Final   Proteus species NOT DETECTED NOT DETECTED Final   Serratia marcescens NOT DETECTED NOT DETECTED Final   Haemophilus influenzae NOT DETECTED NOT DETECTED Final   Neisseria meningitidis NOT DETECTED NOT DETECTED Final   Pseudomonas aeruginosa NOT DETECTED NOT DETECTED Final   Candida albicans NOT DETECTED NOT DETECTED Final   Candida glabrata NOT DETECTED NOT DETECTED  Final   Candida krusei NOT DETECTED NOT DETECTED Final   Candida parapsilosis NOT DETECTED NOT DETECTED Final   Candida tropicalis NOT DETECTED NOT DETECTED Final  MRSA PCR Screening     Status: None   Collection Time: 04/03/16 10:01 PM  Result Value Ref Range Status   MRSA by PCR NEGATIVE NEGATIVE Final    Comment:        The GeneXpert MRSA Assay (FDA approved for NASAL specimens only), is one component of a comprehensive MRSA colonization surveillance program. It is not intended to diagnose MRSA infection nor to guide or monitor treatment for MRSA infections.      Time coordinating discharge: 31 minutes  SIGNED:   Rosita Fire, MD  [facility] 04/06/2016, 11:47 AM  If 7PM-7AM, please contact night-coverage [email or web address] Password [email or web address]

## 2016-04-06 NOTE — Progress Notes (Signed)
Amy Hall   DOB:July 01, 1965   XB#:[id number]    Subjective: She feels better. No further dyspnea. The patient denies any recent signs or symptoms of bleeding such as spontaneous epistaxis, hematuria or hematochezia.   Objective:  Vitals:   04/05/16 2229 04/06/16 0653  BP: 129/75 137/85  Pulse: 82 72  Resp: 18 19  Temp: 97.5 F (36.4 C) 97.5 F (36.4 C)     Intake/Output Summary (Last 24 hours) at 04/06/16 1043 Last data filed at 04/06/16 1022  Gross per 24 hour  Intake           559.17 ml  Output             2300 ml  Net         -1740.83 ml    GENERAL:alert, no distress and comfortable SKIN: skin color, texture, turgor are normal, no rashes or significant lesions EYES: normal, Conjunctiva are pink and non-injected, sclera clear Musculoskeletal:no cyanosis of digits and no clubbing  NEURO: alert & oriented x 3 with fluent speech, no focal motor/sensory deficits   Labs:  Lab Results  Component Value Date   WBC 13.7 (H) 04/06/2016   HGB 8.3 (L) 04/06/2016   HCT 28.7 (L) 04/06/2016   MCV 77.8 (L) 04/06/2016   PLT 476 (H) 04/06/2016   NEUTROABS 14.5 (H) 04/03/2016    Lab Results  Component Value Date   NA 139 04/06/2016   K 3.4 (L) 04/06/2016   CL 103 04/06/2016   CO2 27 04/06/2016    Assessment & Plan:  Leukocytosis, acute on chronic This is likely due to chronic prednisone therapy. Monitor closely  Microcytic anemia with profound iron deficiency I suspect she may have chronic GI bleed, likely due to peptic ulcer disease from chronic prednisone therapy She has received 2 units of blood transfusion last night on 04/03/2016 EGD showed severe esophagitis, likely source of bleeding She would need to be placed on high-dose antiacid medicine She has received 1 dose of IV iron 04/04/16 I will give her another dose as outpatient  Reactive thrombocytosis This is due to iron deficiency Monitor closely  Vitamin B-12 deficiency Could be related to mild  absorption from chronic gastritis/pernicious anemia She has received vitamin B 12 injection daily Recommend high dose oral B12 now I will give her more B12 injection in the outpatient She will need concurrent folic acid supplementation  Rheumatoid arthritis, chronic prednisone dependency Apparently, rheumatology consult is pending from outpatient standpoint  Tobacco abuse with abnormal chest x-ray We discussed importance of tobacco cessation   Profound shortness of breath It is difficult to know what triggered her recent significant shortness of breath, either could be due to COPD exacerbation in the setting of severe anemia Shortness of breath has improved with conservative management Continue to monitor closely  Discharge planning Home soon I have placed return appt to see me on 04/18/16 Will sign off   Heath Lark, MD 04/06/2016  10:43 AM

## 2016-04-08 ENCOUNTER — Other Ambulatory Visit: Payer: Self-pay | Admitting: Internal Medicine

## 2016-04-08 MED ORDER — METRONIDAZOLE 500 MG PO TABS: 500.0000 mg | ORAL_TABLET | Freq: Two times a day (BID) | ORAL | 0 refills | Status: DC

## 2016-04-09 ENCOUNTER — Encounter: Payer: Self-pay | Admitting: Physician Assistant

## 2016-04-09 ENCOUNTER — Ambulatory Visit: Payer: Self-pay | Attending: Internal Medicine | Admitting: Physician Assistant

## 2016-04-09 VITALS — BP 148/94 | HR 91 | Temp 98.5°F | Resp 16 | Wt 133.4 lb

## 2016-04-09 DIAGNOSIS — M069 Rheumatoid arthritis, unspecified: Secondary | ICD-10-CM | POA: Insufficient documentation

## 2016-04-09 DIAGNOSIS — F17200 Nicotine dependence, unspecified, uncomplicated: Secondary | ICD-10-CM

## 2016-04-09 DIAGNOSIS — J45909 Unspecified asthma, uncomplicated: Secondary | ICD-10-CM | POA: Insufficient documentation

## 2016-04-09 DIAGNOSIS — Z7952 Long term (current) use of systemic steroids: Secondary | ICD-10-CM | POA: Insufficient documentation

## 2016-04-09 DIAGNOSIS — Z5189 Encounter for other specified aftercare: Secondary | ICD-10-CM | POA: Insufficient documentation

## 2016-04-09 DIAGNOSIS — K922 Gastrointestinal hemorrhage, unspecified: Secondary | ICD-10-CM | POA: Insufficient documentation

## 2016-04-09 DIAGNOSIS — M4800 Spinal stenosis, site unspecified: Secondary | ICD-10-CM | POA: Insufficient documentation

## 2016-04-09 DIAGNOSIS — D62 Acute posthemorrhagic anemia: Secondary | ICD-10-CM | POA: Insufficient documentation

## 2016-04-09 DIAGNOSIS — D473 Essential (hemorrhagic) thrombocythemia: Secondary | ICD-10-CM

## 2016-04-09 DIAGNOSIS — D75839 Thrombocytosis, unspecified: Secondary | ICD-10-CM

## 2016-04-09 DIAGNOSIS — K209 Esophagitis, unspecified: Secondary | ICD-10-CM | POA: Insufficient documentation

## 2016-04-09 NOTE — Progress Notes (Signed)
Amy Hall  DJT:[id number]  [id number]:[id number]  DOB - [birth year]  Chief Complaint  Patient presents with  . Hospitalization Follow-up       Subjective:   Amy Hall is a 51 y.o. female here today for hospital follow up. She has A history of rheumatoid arthritis on chronic steroid therapy, spinal stenosis, relatively recent pneumonia status post antibiotics, asthma and continues to smoke. She presented to the emergency department on 04/03/2016. She primarily presented with flulike symptoms. She says that she had chills and swelling in her throat and face. Her throat was sore. She also had a little bit of shortness of breath and some wheezing. Again she is chronically on steroids and because of that she had some blisters in her mouth.   She also had difficulty urinating but her urinalysis was pretty normal and no antibiotics were initiated for this.  In the emergency department her white blood cell count 17,000. Her hemoglobin was 5.7. Her platelets are 589. She was admitted by the internal medicine team. Gastroenterology consultation was obtained. An upper GI endoscopy revealed a grade D esophagitis. She was told to avoid anti-inflammatories.Start PPI. Start Sucralfate. She status post 2 units.   Given the thrombocytosis oncology was called. They felt like the white blood cell count was increased secondary to steroid therapy and that the elevated platelet count was reactive. The platelet count improved over the course of her hospitalization. She has follow-up with oncology in 1 week.  She was discharged on 04/06/2016. She has not had any of her medications field. She still has a sore throat and difficulty swallowing. Her appetite is a little less but she is pushing fluids and trying to ER she can. She still has some soreness in the epigastric area. Her joints ache. She is briefly been referred to rheumatology on 2 occasions in the last 6 months. She states that she was denied secondary  to insurance/Money. She continues to smoke.   ROS: GEN: denies fever or chills, denies change in weight Skin: denies lesions or rashes HEENT: denies headache, earache, epistaxis, +sore throat, + neck pain LUNGS: denies SHOB, dyspnea, PND, orthopnea CV: denies CP or palpitations ABD: + abd pain, N or V EXT: denies muscle spasms or swelling; no pain in lower ext, no weakness NEURO: denies numbness or tingling, denies sz, stroke or TIA  ALLERGIES: Allergies  Allergen Reactions  . Gabapentin     More pain     PAST MEDICAL HISTORY: Past Medical History:  Diagnosis Date  . Arthritis   . Asthma   . DDD (degenerative disc disease), lumbar   . Osteoporosis of lumbar spine   . Rheumatoid arthritis ([place])   . Sleep disorder     PAST SURGICAL HISTORY: Past Surgical History:  Procedure Laterality Date  . ELBOW SURGERY    . ESOPHAGOGASTRODUODENOSCOPY (EGD) WITH PROPOFOL Left 04/05/2016   Procedure: ESOPHAGOGASTRODUODENOSCOPY (EGD) WITH PROPOFOL;  Surgeon: Arta Silence, MD;  Location: [facility] ENDOSCOPY;  Service: Endoscopy;  Laterality: Left;    MEDICATIONS AT HOME: Prior to Admission medications   Medication Sig Start Date End Date Taking? Authorizing Provider  acetaminophen-codeine (TYLENOL #3) 300-30 MG tablet Take 1 tablet by mouth every 12 (twelve) hours as needed for moderate pain. 04/03/16  Yes Dawn Lazarus Gowda, MD  albuterol (PROVENTIL HFA;VENTOLIN HFA) 108 (90 Base) MCG/ACT inhaler Inhale 2 puffs into the lungs every 6 (six) hours as needed for wheezing or shortness of breath. 03/20/16  Yes [name], MD  cetirizine (ZYRTEC) 10 MG  tablet Take 1 tablet (10 mg total) by mouth daily. 03/12/16  Yes [name], MD  cyclobenzaprine (FLEXERIL) 5 MG tablet Take 1 tablet (5 mg total) by mouth 2 (two) times daily as needed for muscle spasms. 04/04/16  Yes Maren Reamer, MD  predniSONE (DELTASONE) 10 MG tablet Take 1 tablet (10 mg total) by mouth 2 (two) times daily with a meal. 04/03/16   Yes Maren Reamer, MD  b complex-C-folic acid 1 MG capsule Take 1 capsule (1 mg total) by mouth daily. Patient not taking: Reported on 04/09/2016 04/06/16   Dron Tanna Furry, MD  dextromethorphan-guaiFENesin [facility] DM) 30-600 MG 12hr tablet Take 1 tablet by mouth 2 (two) times daily as needed for cough. 04/06/16   Dron Tanna Furry, MD  magic mouthwash w/lidocaine SOLN Take 5 mLs by mouth 3 (three) times daily as needed for mouth pain. Patient not taking: Reported on 04/09/2016 04/06/16   Dron Tanna Furry, MD  metroNIDAZOLE (FLAGYL) 500 MG tablet Take 1 tablet (500 mg total) by mouth 2 (two) times daily. Patient not taking: Reported on 04/09/2016 04/08/16   Maren Reamer, MD  pantoprazole (PROTONIX) 40 MG tablet Take 1 tablet (40 mg total) by mouth 2 (two) times daily. Patient not taking: Reported on 04/09/2016 04/06/16   Dron Tanna Furry, MD  polyethylene glycol [facility] / Floria Raveling) packet Take 17 g by mouth daily. Patient not taking: Reported on 04/09/2016 04/07/16   Dron Tanna Furry, MD  sucralfate (CARAFATE) 1 GM/10ML suspension Take 10 mLs (1 g total) by mouth 4 (four) times daily -  with meals and at bedtime. Patient not taking: Reported on 04/09/2016 04/06/16   Dron Tanna Furry, MD    Family History  Problem Relation Age of Onset  . Anemia Mother   . Hypertension Father   . Heart disease Father   . Hypertension Sister    Social-unmarried, lives in [place], smokes  Objective:   Vitals:   04/09/16 1037  BP: (!) 148/94  Pulse: 91  Resp: 16  Temp: 98.5 F (36.9 C)  TempSrc: Oral  SpO2: 97%  Weight: 133 lb 6.4 oz (60.5 kg)    Exam General appearance : Awake, alert, not in any distress. Speech Clear. Not toxic looking HEENT: Atraumatic and Normocephalic, pupils equally reactive to light and accomodation Neck: supple, no JVD. No cervical lymphadenopathy.  Chest:Good air entry bilaterally, no added sounds  CVS: S1 S2 regular, no murmurs.  Abdomen: Bowel  sounds present, Non tender and not distended with no guarding, rigidity or rebound. Extremities: B/L Lower Ext shows + edema especially in bilat wrists, both legs are warm to touch Neurology: Awake alert, and oriented X 3, CN II-XII intact, Non focal Skin:No Rash Wounds:N/A  Data Review Lab Results  Component Value Date   HGBA1C 5.7 09/21/2015    Assessment & Plan  1. Acute blood loss anemia/Upper GI bleed  -F/u with Dr. Paulita Fujita  -Encouraged to pickup PPI and Sucralfate today  -CBC in 1 week  2. Grade D Esophagitis  -Avoid NSAIDs (hard with her RA)   3. RA  -2 referrals placed in last 6 mo  -will f/u with our referral specialists as to why she is being denied  4. Thrombocytosis/Leukocytosis  -appt with Dr. Alvy Bimler for next week  5. Smoker  -cessation discussed, she is not ready to [place] in 1 week Return in about 1 week (around 04/16/2016).  The patient was given clear instructions to go to ER or return  to medical center if symptoms don't improve, worsen or new problems develop. The patient verbalized understanding. The patient was told to call to get lab results if they haven't heard anything in the next week.   Total time spent with patient was 17 min (<15 level 2; 16-25 level 3, 26-30 level 4, >40 level 5). Greater than 50 % of this visit was spent face to face counseling and coordinating care regarding risk factor modification, compliance importance and encouragement, education related to RF modification-stop smoking, avoid NSAIDS etc and meds compliance.  This note has been created with Surveyor, quantity. Any transcriptional errors are unintentional.    Amy Pho, PA-C [facility] and [facility], [place]   04/09/2016, 11:00 AM

## 2016-04-10 ENCOUNTER — Inpatient Hospital Stay: Payer: Medicaid Other | Admitting: Internal Medicine

## 2016-04-11 ENCOUNTER — Telehealth: Payer: Self-pay | Admitting: Hematology and Oncology

## 2016-04-11 NOTE — Telephone Encounter (Signed)
Scheduled appt per schedule message from MD [facility] for infusion and inj

## 2016-04-12 ENCOUNTER — Encounter: Payer: Self-pay | Admitting: Hematology and Oncology

## 2016-04-12 ENCOUNTER — Telehealth: Payer: Self-pay

## 2016-04-12 ENCOUNTER — Telehealth: Payer: Self-pay | Admitting: Hematology and Oncology

## 2016-04-12 NOTE — Telephone Encounter (Signed)
Tc to the pt to make aware an appt has been scheduled for her to see Dr. Alvy Bimler on 4/4 at 11am. Vmbx was full. Letter mailed to the pt.

## 2016-04-12 NOTE — Telephone Encounter (Signed)
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

## 2016-04-17 ENCOUNTER — Other Ambulatory Visit: Payer: Self-pay | Admitting: Hematology and Oncology

## 2016-04-17 DIAGNOSIS — D500 Iron deficiency anemia secondary to blood loss (chronic): Secondary | ICD-10-CM

## 2016-04-18 ENCOUNTER — Ambulatory Visit: Payer: Medicaid Other | Admitting: Hematology and Oncology

## 2016-04-18 ENCOUNTER — Telehealth: Payer: Self-pay | Admitting: *Deleted

## 2016-04-18 ENCOUNTER — Other Ambulatory Visit: Payer: Medicaid Other

## 2016-04-18 ENCOUNTER — Ambulatory Visit: Payer: Self-pay

## 2016-04-18 ENCOUNTER — Telehealth: Payer: Self-pay | Admitting: Hematology and Oncology

## 2016-04-18 NOTE — Telephone Encounter (Signed)
Message from operator states patient called to cancel today's appts.  Attempted to call patient to discuss.- Voice mailbox full.  (Needs to rescheduled or be followed by PCP- is B-12 and iron deficient.)

## 2016-04-18 NOTE — Telephone Encounter (Signed)
Reschedule request per 4/4 schedule message sent to new patient coordinator and desk nurse has been copied.

## 2016-04-19 ENCOUNTER — Encounter: Payer: Self-pay | Attending: Physical Medicine & Rehabilitation

## 2016-04-19 ENCOUNTER — Ambulatory Visit: Payer: No Typology Code available for payment source

## 2016-04-19 ENCOUNTER — Encounter: Payer: Self-pay | Admitting: Podiatry

## 2016-04-19 ENCOUNTER — Ambulatory Visit: Payer: Medicaid Other | Admitting: Physical Medicine & Rehabilitation

## 2016-04-19 DIAGNOSIS — M79605 Pain in left leg: Secondary | ICD-10-CM | POA: Insufficient documentation

## 2016-04-19 DIAGNOSIS — M545 Low back pain: Secondary | ICD-10-CM | POA: Insufficient documentation

## 2016-04-19 DIAGNOSIS — M2041 Other hammer toe(s) (acquired), right foot: Secondary | ICD-10-CM

## 2016-04-19 NOTE — Progress Notes (Signed)
This encounter was created in error - please disregard.

## 2016-04-20 ENCOUNTER — Telehealth: Payer: Self-pay | Admitting: Hematology and Oncology

## 2016-04-20 NOTE — Telephone Encounter (Signed)
Appt has been rescheduled for the pt to see Dr. Alvy Bimler on 4/23 at 2pm w/1:30pm labs. Will forward msg to scheduling for inj appt.

## 2016-04-21 ENCOUNTER — Telehealth: Payer: Self-pay | Admitting: Hematology and Oncology

## 2016-04-21 NOTE — Telephone Encounter (Signed)
Scheduled appt for injection per schedule message from [facility] per Dr. Alvy Bimler.

## 2016-05-07 ENCOUNTER — Ambulatory Visit: Payer: Self-pay

## 2016-05-07 ENCOUNTER — Other Ambulatory Visit: Payer: Self-pay

## 2016-05-07 ENCOUNTER — Ambulatory Visit: Payer: Self-pay | Admitting: Hematology and Oncology

## 2016-05-10 MED FILL — ?PREDNISONE 10 MG TABLET: 10 | 30 days supply | Qty: 60 | Fill #1

## 2016-05-10 MED FILL — ACETAMINOPHEN/COD #3 TABLET: 300-30 | 30 days supply | Qty: 60 | Fill #1

## 2016-05-10 MED FILL — CYCLOBENZAPRINE 5 MG TABLET: 5 | 30 days supply | Qty: 60 | Fill #1

## 2016-05-14 ENCOUNTER — Ambulatory Visit: Payer: Self-pay | Admitting: Hematology and Oncology

## 2016-06-05 ENCOUNTER — Telehealth: Payer: Self-pay | Admitting: Family Medicine

## 2016-06-05 DIAGNOSIS — M069 Rheumatoid arthritis, unspecified: Secondary | ICD-10-CM

## 2016-06-05 MED ORDER — PREDNISONE 10 MG PO TABS: 10.0000 mg | ORAL_TABLET | Freq: Two times a day (BID) | ORAL | 1 refills | Status: DC

## 2016-06-05 MED ORDER — CYCLOBENZAPRINE HCL 5 MG PO TABS: 5.0000 mg | ORAL_TABLET | Freq: Two times a day (BID) | ORAL | 2 refills | Status: DC | PRN

## 2016-06-05 MED FILL — predniSONE 10 MG TABS: 10 | 30 days supply | Qty: 60 | Fill #0 | Status: TO

## 2016-06-05 MED FILL — CYCLOBENZAPRINE 5 MG TABLET: 5 | 30 days supply | Qty: 60 | Fill #0 | Status: TO

## 2016-06-05 NOTE — Telephone Encounter (Signed)
Refilled

## 2016-06-05 NOTE — Telephone Encounter (Signed)
Pt. Called requesting a refill on the following medications:  predniSONE (DELTASONE) 10 MG tablet   cyclobenzaprine (FLEXERIL) 5 MG tablet   Pt. Uses the [facility] pharmacy. Please f/u

## 2016-06-05 NOTE — Telephone Encounter (Signed)
Not appropriate for me to refill, will forward to Dr. Jarold Song.

## 2016-06-12 ENCOUNTER — Other Ambulatory Visit: Payer: Self-pay | Admitting: Internal Medicine

## 2016-06-12 DIAGNOSIS — M069 Rheumatoid arthritis, unspecified: Secondary | ICD-10-CM

## 2016-06-13 ENCOUNTER — Ambulatory Visit: Payer: Self-pay

## 2016-06-22 ENCOUNTER — Ambulatory Visit: Payer: Self-pay

## 2016-06-25 ENCOUNTER — Ambulatory Visit: Payer: Self-pay | Attending: Family Medicine

## 2016-08-04 ENCOUNTER — Encounter: Payer: Self-pay | Admitting: Emergency Medicine

## 2016-08-04 ENCOUNTER — Emergency Department: Payer: Self-pay

## 2016-08-04 DIAGNOSIS — H538 Other visual disturbances: Secondary | ICD-10-CM | POA: Insufficient documentation

## 2016-08-04 DIAGNOSIS — Z79899 Other long term (current) drug therapy: Secondary | ICD-10-CM | POA: Insufficient documentation

## 2016-08-04 DIAGNOSIS — Y929 Unspecified place or not applicable: Secondary | ICD-10-CM | POA: Insufficient documentation

## 2016-08-04 DIAGNOSIS — W57XXXA Bitten or stung by nonvenomous insect and other nonvenomous arthropods, initial encounter: Secondary | ICD-10-CM | POA: Insufficient documentation

## 2016-08-04 DIAGNOSIS — Y999 Unspecified external cause status: Secondary | ICD-10-CM | POA: Insufficient documentation

## 2016-08-04 DIAGNOSIS — S40262A Insect bite (nonvenomous) of left shoulder, initial encounter: Secondary | ICD-10-CM | POA: Insufficient documentation

## 2016-08-04 DIAGNOSIS — F17210 Nicotine dependence, cigarettes, uncomplicated: Secondary | ICD-10-CM | POA: Insufficient documentation

## 2016-08-04 DIAGNOSIS — Y939 Activity, unspecified: Secondary | ICD-10-CM | POA: Insufficient documentation

## 2016-08-04 DIAGNOSIS — K279 Peptic ulcer, site unspecified, unspecified as acute or chronic, without hemorrhage or perforation: Secondary | ICD-10-CM | POA: Insufficient documentation

## 2016-08-04 DIAGNOSIS — J40 Bronchitis, not specified as acute or chronic: Secondary | ICD-10-CM | POA: Insufficient documentation

## 2016-08-04 LAB — BASIC METABOLIC PANEL
ANION GAP: 7 (ref 5–15)
BUN: 17 mg/dL (ref 6–20)
CHLORIDE: 105 mmol/L (ref 101–111)
CO2: 27 mmol/L (ref 22–32)
Calcium: 8.4 mg/dL — ABNORMAL LOW (ref 8.9–10.3)
Creatinine, Ser: 0.62 mg/dL (ref 0.44–1.00)
GFR calc Af Amer: >60 mL/min (ref >60)
GLUCOSE: 99 mg/dL (ref 65–99)
POTASSIUM: 3.9 mmol/L (ref 3.5–5.1)
Sodium: 139 mmol/L (ref 135–145)

## 2016-08-04 LAB — CBC
HEMATOCRIT: 31.4 % — AB (ref 35.0–47.0)
HEMOGLOBIN: 9.5 g/dL — AB (ref 12.0–16.0)
MCH: 21.9 pg — ABNORMAL LOW (ref 26.0–34.0)
MCHC: 30.2 g/dL — AB (ref 32.0–36.0)
MCV: 72.3 fL — AB (ref 80.0–100.0)
Platelets: 539 10*3/uL — ABNORMAL HIGH (ref 150–440)
RBC: 4.35 MIL/uL (ref 3.80–5.20)
RDW: 16.1 % — ABNORMAL HIGH (ref 11.5–14.5)
WBC: 14.8 10*3/uL — AB (ref 3.6–11.0)

## 2016-08-04 LAB — TROPONIN I: Troponin I: <0.03 ng/mL (ref <0.03)

## 2016-08-04 MED ORDER — IPRATROPIUM-ALBUTEROL 0.5-2.5 (3) MG/3ML IN SOLN
RESPIRATORY_TRACT | Status: AC
  Administered 2016-08-04: 3 mL via RESPIRATORY_TRACT
  Filled 2016-08-04: qty 3

## 2016-08-04 MED ORDER — IPRATROPIUM-ALBUTEROL 0.5-2.5 (3) MG/3ML IN SOLN
3.0000 mL | Freq: Once | RESPIRATORY_TRACT | Status: AC
  Administered 2016-08-04: 3 mL via RESPIRATORY_TRACT

## 2016-08-04 NOTE — ED Notes (Signed)
Patient sleeping in NAD at this time.

## 2016-08-04 NOTE — ED Triage Notes (Signed)
Patient with complaint of shortness of breath that started this morning. Patient states that she has a history of asthma and that it feels like her asthma but she does not have an inhaler. Patient states that she noticed a tick bite to her back about a month ago and has had blurred vision since. Patient states that she feels like she has a film over her eyes. Patient also with complaint of epigastric pain that started in March. Patient states that at that time she was diagnosed with a gastric ulcer and hospitalized. Patient states that she has not followed up about her ulcer since being discharged.

## 2016-08-04 NOTE — ED Notes (Signed)
Pt ambulatory with steady gait to stat registration asking for coffee; understands NPO until cleared by MD and that staff will be more than happy to get her a cup when it's allowed

## 2016-08-04 NOTE — ED Notes (Signed)
Patient transported to X-ray 

## 2016-08-05 ENCOUNTER — Emergency Department
Admission: EM | Admit: 2016-08-05 | Discharge: 2016-08-05 | Disposition: A | Discharge status: Home / Self Care | Payer: Self-pay | Attending: Emergency Medicine | Admitting: Emergency Medicine

## 2016-08-05 DIAGNOSIS — J40 Bronchitis, not specified as acute or chronic: Secondary | ICD-10-CM

## 2016-08-05 DIAGNOSIS — W57XXXA Bitten or stung by nonvenomous insect and other nonvenomous arthropods, initial encounter: Secondary | ICD-10-CM

## 2016-08-05 DIAGNOSIS — K279 Peptic ulcer, site unspecified, unspecified as acute or chronic, without hemorrhage or perforation: Secondary | ICD-10-CM

## 2016-08-05 MED ORDER — DOXYCYCLINE HYCLATE 50 MG PO CAPS: 100.0000 mg | ORAL_CAPSULE | Freq: Two times a day (BID) | ORAL | 0 refills | Status: DC

## 2016-08-05 MED ORDER — DOXYCYCLINE HYCLATE 100 MG PO TABS
100.0000 mg | ORAL_TABLET | Freq: Once | ORAL | Status: AC
  Administered 2016-08-05: 100 mg via ORAL
  Filled 2016-08-05: qty 1

## 2016-08-05 MED ORDER — PREDNISONE 20 MG PO TABS
60.0000 mg | ORAL_TABLET | Freq: Once | ORAL | Status: DC
  Filled 2016-08-05: qty 3

## 2016-08-05 MED ORDER — FAMOTIDINE 20 MG PO TABS
20.0000 mg | ORAL_TABLET | Freq: Once | ORAL | Status: AC
  Administered 2016-08-05: 20 mg via ORAL
  Filled 2016-08-05: qty 1

## 2016-08-05 MED ORDER — ALBUTEROL SULFATE HFA 108 (90 BASE) MCG/ACT IN AERS
1.0000 | INHALATION_SPRAY | RESPIRATORY_TRACT | Status: DC | PRN
  Filled 2016-08-05: qty 6.7

## 2016-08-05 MED ORDER — ALBUTEROL SULFATE (2.5 MG/3ML) 0.083% IN NEBU: 2.5000 mg | INHALATION_SOLUTION | RESPIRATORY_TRACT | Status: DC | PRN

## 2016-08-05 MED ORDER — PREDNISONE 20 MG PO TABS: ORAL_TABLET | ORAL | 0 refills | Status: DC

## 2016-08-05 MED ORDER — DOXYCYCLINE HYCLATE 100 MG PO TABS
100.0000 mg | ORAL_TABLET | Freq: Once | ORAL | Status: DC
  Filled 2016-08-05: qty 1

## 2016-08-05 MED ORDER — FAMOTIDINE 20 MG PO TABS: 20.0000 mg | ORAL_TABLET | Freq: Two times a day (BID) | ORAL | 0 refills | Status: DC

## 2016-08-05 MED ORDER — PREDNISONE 20 MG PO TABS
60.0000 mg | ORAL_TABLET | Freq: Once | ORAL | Status: AC
  Administered 2016-08-05: 60 mg via ORAL
  Filled 2016-08-05: qty 3

## 2016-08-05 NOTE — Discharge Instructions (Signed)
1. Take antibiotic as prescribed (doxycycline 100 mg twice daily 7 days). 2. Take steroid as prescribed (prednisone 60 mg daily 4 days). 3. Start Pepcid 20 mg twice daily for your reflux symptoms. 4. Use albuterol inhaler 2 puffs every 4 hours as needed for wheezing/difficulty breathing. 5. Return to the ER for worsening symptoms, persistent vomiting, difficulty breathing or other concerns.

## 2016-08-05 NOTE — ED Notes (Signed)
Patient reports shortness of breath feels better at this time.  Reports concerned about area to left shoulder from a tick bite over a month ago.  Also reports feels like she has a film over her eyes causing them to be blurry.  Patient with no acute distress noted.

## 2016-08-05 NOTE — ED Notes (Signed)
Pt. Is going [facility]

## 2016-08-05 NOTE — ED Provider Notes (Signed)
[facility] Emergency Department Provider Note   ____________________________________________   First MD Initiated Contact with Patient 08/05/16 [phone]     (approximate)  I have reviewed the triage vital signs and the nursing notes.   HISTORY  Chief Complaint Shortness of Breath; Blurred Vision; and Abdominal Pain    HPI Amy Hall is a 51 y.o. female who presents to the ED from [facility] with multiple medical complaints. Primarily, patient complains of nonproductive cough with wheezing and shortness of breath 2 days. Secondly, patient had a tick bite to her left posterior shoulder over a month ago and has had blurry vision since. Thirdly, patient complains of epigastric burning which began in March when she was diagnosed with a gastric ulcer and hospitalized. Admits she has not followed up and is not on any PUD medications. Patient denies associated fever, chills, chest pain, nausea, vomiting, diarrhea, rash, headache, neck pain. Denies recent travel or trauma. Nothing makes her symptoms better or worse.   Past Medical History:  Diagnosis Date  . Arthritis   . Asthma   . DDD (degenerative disc disease), lumbar   . Osteoporosis of lumbar spine   . Rheumatoid arthritis ([place])   . Sleep disorder     Patient Active Problem List   Diagnosis Date Noted  . Iron deficiency anemia due to chronic blood loss 04/06/2016  . B12 deficiency anemia 04/06/2016  . B12 deficiency   . GI bleed 04/04/2016  . Asthma exacerbation 04/03/2016  . Tobacco abuse 04/03/2016  . UTI (urinary tract infection) 04/03/2016  . Sepsis ([place]) 04/03/2016  . Symptomatic anemia 04/03/2016  . Allergic reaction 04/03/2016  . Severe anemia 04/03/2016  . Nausea & vomiting 04/03/2016  . Closed nondisplaced fracture of third metatarsal bone of left foot 12/05/2015  . Valgus deformity of great toe 12/05/2015  . 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 Lumbar radiculitis 11/24/2015  .  Rheumatoid arthritis ([place]) 09/02/2015  . Degenerative disc disease, lumbar 09/02/2015  . Spinal stenosis of lumbar region 09/02/2015    Past Surgical History:  Procedure Laterality Date  . ELBOW SURGERY    . ESOPHAGOGASTRODUODENOSCOPY (EGD) WITH PROPOFOL Left 04/05/2016   Procedure: ESOPHAGOGASTRODUODENOSCOPY (EGD) WITH PROPOFOL;  Surgeon: Arta Silence, MD;  Location: [facility] ENDOSCOPY;  Service: Endoscopy;  Laterality: Left;    Prior to Admission medications   Medication Sig Start Date End Date Taking? Authorizing Provider  acetaminophen-codeine (TYLENOL #3) 300-30 MG tablet Take 1 tablet by mouth every 12 (twelve) hours as needed for moderate pain. 04/03/16   Langeland, Leda Quail, MD  albuterol (PROVENTIL HFA;VENTOLIN HFA) 108 (90 Base) MCG/ACT inhaler Inhale 2 puffs into the lungs every 6 (six) hours as needed for wheezing or shortness of breath. 03/20/16   [name], MD  b complex-C-folic acid 1 MG capsule Take 1 capsule (1 mg total) by mouth daily. Patient not taking: Reported on 04/09/2016 04/06/16   Rosita Fire, MD  cetirizine (ZYRTEC) 10 MG tablet Take 1 tablet (10 mg total) by mouth daily. 03/12/16   [name], MD  cyclobenzaprine (FLEXERIL) 5 MG tablet Take 1 tablet (5 mg total) by mouth 2 (two) times daily as needed for muscle spasms. 06/05/16   [name], MD  dextromethorphan-guaiFENesin (MUCINEX DM) 30-600 MG 12hr tablet Take 1 tablet by mouth 2 (two) times daily as needed for cough. 04/06/16   Rosita Fire, MD  doxycycline (VIBRAMYCIN) 50 MG capsule Take 2 capsules (100 mg total) by mouth 2 (two) times daily.  08/05/16   Paulette Blanch, MD  famotidine (PEPCID) 20 MG tablet Take 1 tablet (20 mg total) by mouth 2 (two) times daily. 08/05/16   Paulette Blanch, MD  magic mouthwash w/lidocaine SOLN Take 5 mLs by mouth 3 (three) times daily as needed for mouth pain. Patient not taking: Reported on 04/09/2016 04/06/16   Rosita Fire, MD  metroNIDAZOLE (FLAGYL) 500 MG  tablet Take 1 tablet (500 mg total) by mouth 2 (two) times daily. Patient not taking: Reported on 04/09/2016 04/08/16   Lottie Mussel T, MD  pantoprazole (PROTONIX) 40 MG tablet Take 1 tablet (40 mg total) by mouth 2 (two) times daily. Patient not taking: Reported on 04/09/2016 04/06/16   Rosita Fire, MD  polyethylene glycol [facility] / Floria Raveling) packet Take 17 g by mouth daily. Patient not taking: Reported on 04/09/2016 04/07/16   Rosita Fire, MD  predniSONE (DELTASONE) 20 MG tablet 3 tablets daily x 3 days 08/05/16   Paulette Blanch, MD  sucralfate (CARAFATE) 1 GM/10ML suspension Take 10 mLs (1 g total) by mouth 4 (four) times daily -  with meals and at bedtime. Patient not taking: Reported on 04/09/2016 04/06/16   Rosita Fire, MD    Allergies Gabapentin  Family History  Problem Relation Age of Onset  . Anemia Mother   . Hypertension Father   . Heart disease Father   . Hypertension Sister     Social History Social History  Substance Use Topics  . Smoking status: Current Every Day Smoker    Packs/day: 1.00    Types: Cigarettes  . Smokeless tobacco: Never Used  . Alcohol use 0.6 - 1.2 oz/week    1 - 2 Cans of beer per week     Comment: occ    Review of Systems  Constitutional: No fever/chills. Eyes: Positive for blurry vision. ENT: No sore throat. Cardiovascular: Denies chest pain. Respiratory: Positive for nonproductive cough, wheezing and shortness of breath. Gastrointestinal: Positive for epigastric burning.  No nausea, no vomiting.  No diarrhea.  No constipation. Genitourinary: Negative for dysuria. Musculoskeletal: Negative for back pain. Skin: Negative for rash. Neurological: Negative for headaches, focal weakness or numbness.   ____________________________________________   PHYSICAL EXAM:  VITAL SIGNS: ED Triage Vitals  Enc Vitals Group     BP 08/04/16 2025 106/64     Pulse Rate 08/04/16 2025 (!) 109     Resp 08/04/16 2025 18      Temp 08/04/16 2025 98.2 F (36.8 C)     Temp Source 08/04/16 2025 Oral     SpO2 08/04/16 2025 97 %     Weight 08/04/16 2026 133 lb (60.3 kg)     Height 08/04/16 2026 5\' 2"  (1.575 m)     Head Circumference --      Peak Flow --      Pain Score 08/04/16 2026 8     Pain Loc --      Pain Edu? --      Excl. in [place]? --     Constitutional: Alert and oriented. Well appearing and in no acute distress. Eyes: Conjunctivae are normal. PERRL. EOMI. Head: Atraumatic. Nose: No congestion/rhinnorhea. Mouth/Throat: Mucous membranes are moist.  Oropharynx non-erythematous. Neck: No stridor.  Supple neck without meningismus. Hematological/Lymphatic/Immunilogical: No cervical lymphadenopathy. Cardiovascular: Normal rate, regular rhythm. Grossly normal heart sounds.  Good peripheral circulation. Respiratory: Normal respiratory effort.  No retractions. Lungs CTAB. No wheezing. Gastrointestinal: Soft and mildly tender to palpation to epigastrium without rebound  or guarding. No distention. No abdominal bruits. No CVA tenderness. Musculoskeletal: No lower extremity tenderness nor edema.  No joint effusions. Neurologic:  Normal speech and language. No gross focal neurologic deficits are appreciated. No gait instability. Skin:  Skin is warm, dry and intact. Examined site of tick bite on left posterior shoulder which is not infected or inflamed. No rash noted. No petechiae. Psychiatric: Mood and affect are normal. Speech and behavior are normal.  ____________________________________________   LABS (all labs ordered are listed, but only abnormal results are displayed)  Labs Reviewed  BASIC METABOLIC PANEL - Abnormal; Notable for the following:       Result Value   Calcium 8.4 (*)    All other components within normal limits  CBC - Abnormal; Notable for the following:    WBC 14.8 (*)    Hemoglobin 9.5 (*)    HCT 31.4 (*)    MCV 72.3 (*)    MCH 21.9 (*)    MCHC 30.2 (*)    RDW 16.1 (*)    Platelets 539  (*)    All other components within normal limits  TROPONIN I   ____________________________________________  EKG  ED ECG REPORT I, SUNG,JADE J, the attending physician, personally viewed and interpreted this ECG.   Date: 08/05/2016  EKG Time: 2033  Rate: 108  Rhythm: sinus tachycardia  Axis: Normal  Intervals:none  ST&T Change: Nonspecific  ____________________________________________  RADIOLOGY  Dg Chest 2 View  Result Date: 08/04/2016 CLINICAL DATA:  Cough with dyspnea for the past few days. History of smoking. EXAM: CHEST  2 VIEW COMPARISON:  None. FINDINGS: The heart size and mediastinal contours are within normal limits. There is aortic atherosclerosis without aneurysm. Diffuse interstitial prominence likely reflecting chronic bronchitic change is noted. Both lungs are otherwise clear. Likely remote mild anterior superior endplate compression of L1. IMPRESSION: 1. Chronic bronchitic change of the lungs. 2. Aortic atherosclerosis. 3. Likely remote superior endplate compression fracture of L1. Electronically Signed   By: Ashley Royalty M.D.   On: 08/04/2016 21:18    ____________________________________________   PROCEDURES  Procedure(s) performed: None  Procedures  Critical Care performed: No  ____________________________________________   INITIAL IMPRESSION / ASSESSMENT AND PLAN / ED COURSE  Pertinent labs & imaging results that were available during my care of the patient were reviewed by me and considered in my medical decision making (see chart for details).  51 year old female presenting with bronchitis, epigastric burning with a history of PUD, and blurry vision since a tick bite over 1 month ago. Laboratory and imaging results remarkable for mild leukocytosis and bronchitis on chest x-ray. Patient feels better after receiving nebulizer treatment while awaiting treatment room. Will place on doxycycline which will also empirically cover tick bite, prednisone,  Pepcid and albuterol inhaler. Patient is at [facility] who will be unable to get her prescriptions filled until Monday; patient will be sent back to the facility with one dose of her medications until her prescriptions are able to be filled. Strict return precautions given. Patient verbalizes understanding and agrees with plan of care.      ____________________________________________   FINAL CLINICAL IMPRESSION(S) / ED DIAGNOSES  Final diagnoses:  Bronchitis  Tick bite, initial encounter  PUD (peptic ulcer disease)      NEW MEDICATIONS STARTED DURING THIS VISIT:  New Prescriptions   DOXYCYCLINE (VIBRAMYCIN) 50 MG CAPSULE    Take 2 capsules (100 mg total) by mouth 2 (two) times daily.   FAMOTIDINE (PEPCID) 20 MG TABLET  Take 1 tablet (20 mg total) by mouth 2 (two) times daily.   PREDNISONE (DELTASONE) 20 MG TABLET    3 tablets daily x 3 days     Note:  This document was prepared using Dragon voice recognition software and may include unintentional dictation errors.    Paulette Blanch, MD 08/05/16 [phone]

## 2016-08-07 ENCOUNTER — Other Ambulatory Visit: Payer: Self-pay | Admitting: Family Medicine

## 2016-08-08 ENCOUNTER — Other Ambulatory Visit: Payer: Self-pay | Admitting: Family Medicine

## 2016-08-08 DIAGNOSIS — M069 Rheumatoid arthritis, unspecified: Secondary | ICD-10-CM

## 2016-08-08 MED FILL — CYCLOBENZAPRINE 5 MG TABLET: 5 | 30 days supply | Qty: 60 | Fill #2

## 2016-08-14 ENCOUNTER — Ambulatory Visit: Payer: Self-pay | Attending: Family Medicine | Admitting: Licensed Clinical Social Worker

## 2016-08-14 ENCOUNTER — Ambulatory Visit: Payer: Self-pay | Attending: Family Medicine | Admitting: Family Medicine

## 2016-08-14 VITALS — BP 118/75 | HR 106 | Temp 98.0°F | Resp 18 | Ht 62.0 in | Wt 136.0 lb

## 2016-08-14 DIAGNOSIS — M48062 Spinal stenosis, lumbar region with neurogenic claudication: Secondary | ICD-10-CM | POA: Insufficient documentation

## 2016-08-14 DIAGNOSIS — Z131 Encounter for screening for diabetes mellitus: Secondary | ICD-10-CM

## 2016-08-14 DIAGNOSIS — H538 Other visual disturbances: Secondary | ICD-10-CM | POA: Insufficient documentation

## 2016-08-14 DIAGNOSIS — Z79899 Other long term (current) drug therapy: Secondary | ICD-10-CM | POA: Insufficient documentation

## 2016-08-14 DIAGNOSIS — F1910 Other psychoactive substance abuse, uncomplicated: Secondary | ICD-10-CM

## 2016-08-14 DIAGNOSIS — M069 Rheumatoid arthritis, unspecified: Secondary | ICD-10-CM | POA: Insufficient documentation

## 2016-08-14 DIAGNOSIS — M5136 Other intervertebral disc degeneration, lumbar region: Secondary | ICD-10-CM | POA: Insufficient documentation

## 2016-08-14 DIAGNOSIS — J45909 Unspecified asthma, uncomplicated: Secondary | ICD-10-CM | POA: Insufficient documentation

## 2016-08-14 DIAGNOSIS — Z9889 Other specified postprocedural states: Secondary | ICD-10-CM | POA: Insufficient documentation

## 2016-08-14 LAB — HEMOGLOBIN A1C: Blood: 5.9

## 2016-08-14 MED ORDER — CYCLOBENZAPRINE HCL 5 MG PO TABS: 5.0000 mg | ORAL_TABLET | Freq: Two times a day (BID) | ORAL | 2 refills | Status: DC | PRN

## 2016-08-14 MED ORDER — PREDNISONE 10 MG PO TABS: ORAL_TABLET | ORAL | 1 refills | Status: DC

## 2016-08-14 MED FILL — ?PREDNISONE 10 MG TABLET: 10 | 30 days supply | Qty: 60 | Fill #0

## 2016-08-14 NOTE — Progress Notes (Signed)
Subjective:  Patient ID: Amy Hall, female    DOB: May 15, 1965  Age: 51 y.o. MRN: [id number]  CC: Medication Refill   HPI Amy Hall is a 51 year old female with a history of rheumatoid arthritis, spinal stenosis who has not been able to see a rheumatologist due to lack of insurance and remains on chronic prednisone.  I referred her to rheumatology in 11/2015 and again in 02/2016 ;review of referral notes indicates she had been informed of the options for rheumatology consults at [facility] and [facility] but the patient never followed through with this. She informs me her [facility] financial discount has expired.  She does have pain in her ankles, her fingers and other joints. Also complains of blurry vision.     Past Medical History:  Diagnosis Date  . Arthritis   . Asthma   . DDD (degenerative disc disease), lumbar   . Osteoporosis of lumbar spine   . Rheumatoid arthritis ([place])   . Sleep disorder     Past Surgical History:  Procedure Laterality Date  . ELBOW SURGERY    . ESOPHAGOGASTRODUODENOSCOPY (EGD) WITH PROPOFOL Left 04/05/2016   Procedure: ESOPHAGOGASTRODUODENOSCOPY (EGD) WITH PROPOFOL;  Surgeon: Arta Silence, MD;  Location: [facility] ENDOSCOPY;  Service: Endoscopy;  Laterality: Left;     Outpatient Medications Prior to Visit  Medication Sig Dispense Refill  . albuterol (PROVENTIL HFA;VENTOLIN HFA) 108 (90 Base) MCG/ACT inhaler Inhale 2 puffs into the lungs every 6 (six) hours as needed for wheezing or shortness of breath. 54 g 3  . famotidine (PEPCID) 20 MG tablet Take 1 tablet (20 mg total) by mouth 2 (two) times daily. 60 tablet 0  . acetaminophen-codeine (TYLENOL #3) 300-30 MG tablet Take 1 tablet by mouth every 12 (twelve) hours as needed for moderate pain. 60 tablet 1  . cyclobenzaprine (FLEXERIL) 5 MG tablet Take 1 tablet (5 mg total) by mouth 2 (two) times daily as needed for muscle spasms. 60 tablet 2  .  predniSONE (DELTASONE) 20 MG tablet 3 tablets daily x 3 days 9 tablet 0  . b complex-C-folic acid 1 MG capsule Take 1 capsule (1 mg total) by mouth daily. (Patient not taking: Reported on 04/09/2016) 30 capsule 0  . cetirizine (ZYRTEC) 10 MG tablet Take 1 tablet (10 mg total) by mouth daily. (Patient not taking: Reported on 08/14/2016) 30 tablet 2  . dextromethorphan-guaiFENesin (MUCINEX DM) 30-600 MG 12hr tablet Take 1 tablet by mouth 2 (two) times daily as needed for cough. (Patient not taking: Reported on 08/14/2016) 20 tablet 0  . doxycycline (VIBRAMYCIN) 50 MG capsule Take 2 capsules (100 mg total) by mouth 2 (two) times daily. (Patient not taking: Reported on 08/14/2016) 28 capsule 0  . magic mouthwash w/lidocaine SOLN Take 5 mLs by mouth 3 (three) times daily as needed for mouth pain. (Patient not taking: Reported on 04/09/2016) 100 mL 0  . pantoprazole (PROTONIX) 40 MG tablet Take 1 tablet (40 mg total) by mouth 2 (two) times daily. (Patient not taking: Reported on 04/09/2016) 60 tablet 0  . polyethylene glycol (MIRALAX / GLYCOLAX) packet Take 17 g by mouth daily. (Patient not taking: Reported on 04/09/2016) 14 each 0  . sucralfate (CARAFATE) 1 GM/10ML suspension Take 10 mLs (1 g total) by mouth 4 (four) times daily -  with meals and at bedtime. (Patient not taking: Reported on 04/09/2016) 420 mL 0  . metroNIDAZOLE (FLAGYL) 500 MG tablet Take 1 tablet (500 mg total) by mouth 2 (two)  times daily. (Patient not taking: Reported on 04/09/2016) 14 tablet 0  . predniSONE (DELTASONE) 10 MG tablet TAKE 1 TABLET BY MOUTH TWICE DAILY WITH A MEAL (Patient not taking: Reported on 08/14/2016) 60 tablet 0   No facility-administered medications prior to visit.     ROS Review of Systems Constitutional: Negative for activity change, appetite change and fatigue.  HENT: Negative for congestion, sinus pressure and sore throat.   Eyes: Blurry vision.  Respiratory: positive for cough, chest tightness, shortness of  breath, no wheezing.   Cardiovascular: Negative for chest pain and palpitations.  Gastrointestinal: Negative for abdominal distention, abdominal pain and constipation.  Endocrine: Negative for polydipsia.  Genitourinary: see hpi.  Musculoskeletal:       See hpi  Skin: positive for rash.  Neurological: Negative for tremors, light-headedness and numbness.  Hematological: Does not bruise/bleed easily.  Psychiatric/Behavioral: Negative for agitation and behavioral problems  Objective:  BP 118/75 (BP Location: Right Arm, Patient Position: Sitting, Cuff Size: Normal)   Pulse (!) 106   Temp 98 F (36.7 C) (Oral)   Resp 18   Ht 5\' 2"  (1.575 m)   Wt 136 lb (61.7 kg)   LMP 01/16/2015   SpO2 96%   BMI 24.87 kg/m   BP/Weight 08/14/2016 08/05/2016 1/47/8295  Systolic BP 621 308 -  Diastolic BP 75 79 -  Wt. (Lbs) 136 - 133  BMI 24.87 24.33 -      Physical Exam Constitutional: She is oriented to person, place, and time. She appears well-developed and well-nourished.  HEENT: oropharyngeal erythema, mucus in oropharynx. Normal TM bilaterally; multiple dental caries Cardiovascular: Normal heart sounds and intact distal pulses.  Tachycardia present.   No murmur heard. Pulmonary/Chest: Effort normal. She has no wheezing. She has no rales. She exhibits no tenderness.  Abdominal: Soft. Bowel sounds are normal. She exhibits no distension and no mass. There is no tenderness.  Musculoskeletal:  Rheumatoid nodules in multiple joints  No CVA tenderness Neurological: She is alert and oriented to person, place, and time.   Lab Results  Component Value Date   HGBA1C 5.7 09/21/2015    Assessment & Plan:   1. Rheumatoid arthritis involving multiple sites, unspecified rheumatoid factor presence ([place]) Provided patient with numbers to Rheumatology at [facility] and [facility] as per referral notes and she has been informed to call to set up an appointment I have explained to  her again and again that she is on suboptimal management for rheumatoid arthritis and will need to be placed on DMARDS by rheumatology Discussed side effects of prednisone - predniSONE (DELTASONE) 10 MG tablet; TAKE 1 TABLET BY MOUTH TWICE DAILY WITH A MEAL  Dispense: 60 tablet; Refill: 1  2. Screening for diabetes mellitus Screened for diabetes given blurry vision-advised she will need to schedule an eye exam and has been informed of community resources available. A1c of 5.7 - Hemoglobin A1c  3. Spinal stenosis of lumbar region with neurogenic claudication - cyclobenzaprine (FLEXERIL) 5 MG tablet; Take 1 tablet (5 mg total) by mouth 2 (two) times daily as needed for muscle spasms.  Dispense: 60 tablet; Refill: 2   Meds ordered this encounter  Medications  . cyclobenzaprine (FLEXERIL) 5 MG tablet    Sig: Take 1 tablet (5 mg total) by mouth 2 (two) times daily as needed for muscle spasms.    Dispense:  60 tablet    Refill:  2  . predniSONE (DELTASONE) 10 MG tablet    Sig: TAKE  1 TABLET BY MOUTH TWICE DAILY WITH A MEAL    Dispense:  60 tablet    Refill:  1    Please consider 90 day supplies to promote better adherence    Follow-up: Return in about 1 month (around 09/14/2016) for Complete physical exam.   This note has been created with Surveyor, quantity. Any transcriptional errors are unintentional.     [name] MD

## 2016-08-14 NOTE — BH Specialist Note (Signed)
Integrated Behavioral Health Initial Visit  MRN: [id number] Name: Amy Hall   Session Start time: 4:30 PM Session End time: 5:00 PM Total time: 30 minutes  Type of Service: [place] Interpretor:No. Interpretor Name and Language: N/A   Warm Hand Off Completed.       SUBJECTIVE: Amy Hall is a 51 y.o. female accompanied by patient. Patient was referred by Dr. Jarold Song for depression and anxiety. Patient reports the following symptoms/concerns: overwhelming feelings of sadness and worry, difficulty sleeping, low energy, difficulty concentrating, irritability, and suicidal ideations Duration of problem: 2-3 years; Severity of problem: moderate  OBJECTIVE: Mood: Anxious and Affect: Appropriate Risk of harm to self or others: No plan to harm self or others   LIFE CONTEXT: Family and Social: Pt resides with adult daughter. She reports not getting along with daughter's boyfriend and is interested in obtaining independent housing School/Work: Pt is unemployed. 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 Pt has participated in detox with [facility] in [place]: Pt has been sober from substances for a week. She is interested in inpatient treatment  GOALS ADDRESSED: Patient will reduce symptoms of: anxiety and depression and increase knowledge and/or ability of: coping skills and also: Increase adequate support systems for patient/family and Decrease self-medicating behaviors   INTERVENTIONS: Solution-Focused Strategies, Supportive Counseling, Psychoeducation and/or Health Education and Link to [employer]  Standardized Assessments completed: GAD-7 and PHQ 2&9  ASSESSMENT: Patient currently experiencing depression and anxiety triggered by ongoing medical concerns and substance use. She reports overwhelming feelings of sadness  and worry, difficulty sleeping, low energy, difficulty concentrating, irritability, and suicidal ideations. Patient denies current SI/HI/AVH. She receives limited support. LCSWA inquired on protective factors and discussed importance of safety plan. Patient may benefit from pyschoeducation, psychotherapy, and medication management. [place] educated pt on correlation between one's physical and mental health. Pt was informed on how substance use negatively impacts health. Pt recently completed detox and is interested in inpatient therapy. LCSWA provided resources on substance use programs (outpatient and inpatient), crisis intervention, and psychotherapy for co-occurring disorders.   PLAN: 1. Follow up with behavioral health clinician on : Pt was encouraged to contact LCSWA if symptoms worsen or fail to improve to schedule behavioral appointments at [facility]. 2. Behavioral recommendations: LCSWA recommends that pt apply healthy coping skills discussed and utilize provided resources. Pt is encouraged to schedule follow up appointment with LCSWA 3. Referral(s): Armed forces logistics/support/administrative officer (LME/Outside Clinic) and Substance Abuse Program 4. "From scale of 1-10, how likely are you to follow plan?": 7/10  Rebekah Chesterfield, LCSW 08/16/16 10:58 AM

## 2016-08-14 NOTE — Progress Notes (Signed)
Patient has eaten  °Patient has had medication  °

## 2016-08-14 NOTE — Patient Instructions (Signed)
Rheumatoid Arthritis Rheumatoid arthritis (RA) is a long-term (chronic) disease that causes inflammation in your joints. RA may start slowly. It usually affects the small joints of the hands and feet. Usually, the same joints are affected on both sides of your body. Inflammation from RA can also affect other parts of your body, including your heart, eyes, or lungs. RA is an autoimmune disease. That means that your body's defense system (immune system) mistakenly attacks healthy body tissues. There is no cure for RA, but medicines can help your symptoms and halt or slow down the progression of the disease. What are the causes? The exact cause of RA is not known. What increases the risk? This condition is more likely to develop in:  Women.  People who have a family history of RA or other autoimmune diseases.  What are the signs or symptoms? Symptoms of this condition vary from person to person. Symptoms usually start gradually. They are often worse in the morning. The first symptom may be morning stiffness that lasts longer than 30 minutes. As RA progresses, symptoms may include:  Pain, stiffness, swelling, warmth, and tenderness in joints on both sides of your body.  Loss of energy.  Loss of appetite.  Weight loss.  Low-grade fever.  Dry eyes and dry mouth.  Firm lumps (rheumatoid nodules) that grow beneath your skin in areas such as your forearm bones near your elbows and on your hands.  Changes in the appearance of joints (deformity) and loss of joint function.  Symptoms of RA often come and go. Sometimes, symptoms get worse for a period of time. These are called flares. How is this diagnosed? This condition is diagnosed based on your symptoms, medical history, and physical exam. You may have X-rays or MRI to check for the type of joint changes that are caused by RA. You may also have blood tests to look for:  Proteins (antibodies) that your immune system may make if you have RA.  They include rheumatoid factor (RF) and anti-CCP. ? When blood tests show these proteins, you are said to have "seropositive RA." ? When blood tests do not show these proteins, you may have "seronegative RA."  Inflammation in your blood.  A low number of red blood cells (anemia).  How is this treated? The goals of treatment are to relieve pain, reduce inflammation, and slow down or stop joint damage and disability. Treatment may include:  Lifestyle changes. It is important to rest, eat a healthy diet, and exercise.  Medicines. Your health care provider may adjust your medicines every 3 months until treatment goals are reached. Common medicines include: ? Pain relievers (analgesics). ? Corticosteroids and NSAIDs to reduce inflammation. ? Disease-modifying antirheumatic drugs (DMARDs) to try to slow the course of the disease. ? Biologic response modifiers to reduce inflammation and damage.  Physical therapy and occupational therapy.  Surgery, if you have severe joint damage. Joint replacement or fusing of joints may be needed.  Your health care provider will work with you to identify the best treatment option for you based on assessment of the overall disease activity in your body. Follow these instructions at home:  Take over-the-counter and prescription medicines only as told by your health care provider.  Start an exercise program as told by your health care provider.  Rest when you are having a flare.  Return to your normal activities as told by your health care provider. Ask your health care provider what activities are safe for you.  Keep all   follow-up visits as told by your health care provider. This is important. Where to find more information:  American College of Rheumatology: www.rheumatology.org  Arthritis Foundation: www.arthritis.org Contact a health care provider if:  You have a flare-up of RA symptoms.  You have a fever.  You have side effects from your  medicines. Get help right away if:  You have chest pain.  You have trouble breathing.  You quickly develop a hot, painful joint that is more severe than your usual joint aches. This information is not intended to replace advice given to you by your health care provider. Make sure you discuss any questions you have with your health care provider. Document Released: 12/30/1999 Document Revised: 06/07/2015 Document Reviewed: 10/14/2014 Elsevier Interactive Patient Education  2017 Elsevier Inc.  

## 2016-08-15 ENCOUNTER — Encounter: Payer: Self-pay | Admitting: Family Medicine

## 2016-09-06 ENCOUNTER — Telehealth: Payer: Self-pay | Admitting: Family Medicine

## 2016-09-06 NOTE — Telephone Encounter (Signed)
Pt. Called stating she applied with [facility] financial assistance and was approved. Pt. Would like her Rheumatology referral to be faxed at [phone].   Thanks

## 2016-09-10 ENCOUNTER — Other Ambulatory Visit: Payer: Self-pay | Admitting: Family Medicine

## 2016-09-10 DIAGNOSIS — M069 Rheumatoid arthritis, unspecified: Secondary | ICD-10-CM

## 2016-09-10 MED FILL — CYCLOBENZAPRINE 5 MG TABLET: 5 | 30 days supply | Qty: 60 | Fill #0

## 2016-09-10 MED FILL — ?PREDNISONE 10 MG TABLET: 10 | 30 days supply | Qty: 60 | Fill #1

## 2016-09-14 MED FILL — OMEPRAZOLE DR 40 MG CAPSULE: 40 | 30 days supply | Qty: 30 | Fill #0

## 2016-09-14 MED FILL — FOLIC ACID 1 MG TABLET: 1 | 30 days supply | Qty: 30 | Fill #0

## 2016-09-14 MED FILL — METHOTREXATE 2.5 MG TABLET: 2.5 | 28 days supply | Qty: 24 | Fill #0

## 2016-09-14 MED FILL — ALENDRONATE NA 70 MG TAB: 70 | 28 days supply | Qty: 4 | Fill #0

## 2016-09-20 ENCOUNTER — Encounter: Payer: Self-pay | Admitting: Family Medicine

## 2016-09-20 ENCOUNTER — Ambulatory Visit: Payer: Self-pay | Attending: Family Medicine | Admitting: Family Medicine

## 2016-09-20 VITALS — BP 116/75 | HR 85 | Temp 98.1°F | Ht 62.0 in | Wt 135.0 lb

## 2016-09-20 DIAGNOSIS — M25569 Pain in unspecified knee: Secondary | ICD-10-CM | POA: Insufficient documentation

## 2016-09-20 DIAGNOSIS — J0100 Acute maxillary sinusitis, unspecified: Secondary | ICD-10-CM

## 2016-09-20 DIAGNOSIS — M810 Age-related osteoporosis without current pathological fracture: Secondary | ICD-10-CM | POA: Insufficient documentation

## 2016-09-20 DIAGNOSIS — J45909 Unspecified asthma, uncomplicated: Secondary | ICD-10-CM | POA: Insufficient documentation

## 2016-09-20 DIAGNOSIS — K2970 Gastritis, unspecified, without bleeding: Secondary | ICD-10-CM | POA: Insufficient documentation

## 2016-09-20 DIAGNOSIS — Z79899 Other long term (current) drug therapy: Secondary | ICD-10-CM | POA: Insufficient documentation

## 2016-09-20 DIAGNOSIS — Z7952 Long term (current) use of systemic steroids: Secondary | ICD-10-CM | POA: Insufficient documentation

## 2016-09-20 DIAGNOSIS — B001 Herpesviral vesicular dermatitis: Secondary | ICD-10-CM

## 2016-09-20 DIAGNOSIS — M5136 Other intervertebral disc degeneration, lumbar region: Secondary | ICD-10-CM | POA: Insufficient documentation

## 2016-09-20 DIAGNOSIS — K2900 Acute gastritis without bleeding: Secondary | ICD-10-CM

## 2016-09-20 DIAGNOSIS — M4800 Spinal stenosis, site unspecified: Secondary | ICD-10-CM | POA: Insufficient documentation

## 2016-09-20 DIAGNOSIS — M069 Rheumatoid arthritis, unspecified: Secondary | ICD-10-CM

## 2016-09-20 MED ORDER — PANTOPRAZOLE SODIUM 40 MG PO TBEC: 40.0000 mg | DELAYED_RELEASE_TABLET | Freq: Every day | ORAL | 2 refills | Status: DC

## 2016-09-20 MED ORDER — AMOXICILLIN 500 MG PO CAPS: 500.0000 mg | ORAL_CAPSULE | Freq: Three times a day (TID) | ORAL | 0 refills | Status: DC

## 2016-09-20 MED ORDER — ACYCLOVIR 5 % EX OINT: 1.0000 "application " | TOPICAL_OINTMENT | Freq: Four times a day (QID) | CUTANEOUS | 0 refills | Status: DC | PRN

## 2016-09-20 MED FILL — AMOXICILLIN 500 MG CAPSULE: 500 | 10 days supply | Qty: 30 | Fill #0

## 2016-09-20 MED FILL — ?PANTOPRAZOLE SOD DR 40MG: 40 MG | 30 days supply | Qty: 30 | Fill #0

## 2016-09-20 NOTE — Progress Notes (Signed)
Subjective:  Patient ID: Amy Hall, female    DOB: 1965/06/03  Age: 51 y.o. MRN: [id number]  CC: Wheezing   HPI Amy Hall is a 51 year old female with a history of asthma, rheumatoid arthritis, spinal stenosis who presents today with a two-week history of worsening reflux symptoms, abdominal bloating which has not responded to the use of Pepcid. Denies nausea, vomiting or diarrhea and has not noticed blood in her stool or sputum.  Gastrointestinal symptoms were subsequently followed by sinus pressure and pain, nasal congestion, having to clear her throat frequently. Using the Zyrtec with no relief of symptoms She endorses fatigue, sores on her mouth and tongue but no fever.Marland Kitchen  She was recently seen by rheumatology at [facility] one week ago and commenced on methotrexate and is currently on a prednisone taper. She also received intra-articular knee Kenalog injections and reports improvement in her knee pain.  Past Medical History:  Diagnosis Date  . Arthritis   . Asthma   . DDD (degenerative disc disease), lumbar   . Osteoporosis of lumbar spine   . Rheumatoid arthritis ([place])   . Sleep disorder     Past Surgical History:  Procedure Laterality Date  . ELBOW SURGERY    . ESOPHAGOGASTRODUODENOSCOPY (EGD) WITH PROPOFOL Left 04/05/2016   Procedure: ESOPHAGOGASTRODUODENOSCOPY (EGD) WITH PROPOFOL;  Surgeon: Arta Silence, MD;  Location: [facility] ENDOSCOPY;  Service: Endoscopy;  Laterality: Left;    Allergies  Allergen Reactions  . Gabapentin     More pain      Outpatient Medications Prior to Visit  Medication Sig Dispense Refill  . albuterol (PROVENTIL HFA;VENTOLIN HFA) 108 (90 Base) MCG/ACT inhaler Inhale 2 puffs into the lungs every 6 (six) hours as needed for wheezing or shortness of breath. 54 g 3  . cetirizine (ZYRTEC) 10 MG tablet Take 1 tablet (10 mg total) by mouth daily. 30 tablet 2  . cyclobenzaprine (FLEXERIL) 5 MG tablet Take 1 tablet (5 mg  total) by mouth 2 (two) times daily as needed for muscle spasms. 60 tablet 2  . dextromethorphan-guaiFENesin (MUCINEX DM) 30-600 MG 12hr tablet Take 1 tablet by mouth 2 (two) times daily as needed for cough. 20 tablet 0  . polyethylene glycol (MIRALAX / GLYCOLAX) packet Take 17 g by mouth daily. 14 each 0  . predniSONE (DELTASONE) 10 MG tablet TAKE 1 TABLET BY MOUTH TWICE DAILY WITH A MEAL 60 tablet 1  . famotidine (PEPCID) 20 MG tablet Take 1 tablet (20 mg total) by mouth 2 (two) times daily. 60 tablet 0  . b complex-C-folic acid 1 MG capsule Take 1 capsule (1 mg total) by mouth daily. (Patient not taking: Reported on 04/09/2016) 30 capsule 0  . doxycycline (VIBRAMYCIN) 50 MG capsule Take 2 capsules (100 mg total) by mouth 2 (two) times daily. (Patient not taking: Reported on 08/14/2016) 28 capsule 0  . magic mouthwash w/lidocaine SOLN Take 5 mLs by mouth 3 (three) times daily as needed for mouth pain. (Patient not taking: Reported on 04/09/2016) 100 mL 0  . pantoprazole (PROTONIX) 40 MG tablet Take 1 tablet (40 mg total) by mouth 2 (two) times daily. (Patient not taking: Reported on 04/09/2016) 60 tablet 0  . sucralfate (CARAFATE) 1 GM/10ML suspension Take 10 mLs (1 g total) by mouth 4 (four) times daily -  with meals and at bedtime. (Patient not taking: Reported on 04/09/2016) 420 mL 0   No facility-administered medications prior to visit.     ROS Review of Systems Constitutional:  Negative for activity change, appetite change and fatigue.  HENT: See history of present illness.   Eyes: Blurry vision.  Respiratory: positive for cough,wheezing.   Cardiovascular: Negative for chest pain and palpitations.  Gastrointestinal: Negative for abdominal distention, abdominal pain and constipation.  Endocrine: Negative for polydipsia.  Genitourinary: see hpi.  Musculoskeletal:       See hpi  Skin: positive for rash.  Neurological: Negative for tremors, light-headedness and numbness.  Hematological: Does  not bruise/bleed easily.  Psychiatric/Behavioral: Negative for agitation and behavioral problems  Objective:  BP 116/75   Pulse 85   Temp 98.1 F (36.7 C) (Oral)   Ht 5\' 2"  (1.575 m)   Wt 135 lb (61.2 kg)   LMP 01/16/2015   SpO2 97%   BMI 24.69 kg/m   BP/Weight 09/20/2016 08/14/2016 0/93/2355  Systolic BP 732 202 542  Diastolic BP 75 75 79  Wt. (Lbs) 135 136 -  BMI 24.69 24.87 24.33      Physical Exam Constitutional: She is oriented to person, place, and time. She appears well-developed and well-nourished.  HEENT: oropharyngeal erythema, mucus in oropharynx. 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  Tachycardia present.   No murmur heard. Pulmonary/Chest: Effort normal. She has no wheezing. She has no rales. She exhibits no tenderness.  Abdominal: Soft. Bowel sounds are normal. She exhibits no distension and no mass. There is no tenderness.  Musculoskeletal:  Rheumatoid nodules in multiple joints  No CVA tenderness Neurological: She is alert and oriented to person, place, and time.  Assessment & Plan:   1. Acute non-recurrent maxillary sinusitis Will treat sinus symptoms - amoxicillin (AMOXIL) 500 MG capsule; Take 1 capsule (500 mg total) by mouth 3 (three) times daily.  Dispense: 30 capsule; Refill: 0  2. Herpes labialis - acyclovir ointment (ZOVIRAX) 5 %; Apply 1 application topically every 6 (six) hours as needed.  Dispense: 15 g; Refill: 0  3. Other acute gastritis without hemorrhage Chronic steroid use a precipitating factor Pepcid ineffective Switched to pantoprazole - H. pylori breath test  4. Rheumatoid arthritis Mouth ulcers, fatigue and malaise could be side effects of methotrexate. Patient advised to get in touch with rheumatology in the event that symptoms persist.  Meds ordered this encounter  Medications  . amoxicillin  (AMOXIL) 500 MG capsule    Sig: Take 1 capsule (500 mg total) by mouth 3 (three) times daily.    Dispense:  30 capsule    Refill:  0  . acyclovir ointment (ZOVIRAX) 5 %    Sig: Apply 1 application topically every 6 (six) hours as needed.    Dispense:  15 g    Refill:  0    Follow-up: Return in about 6 weeks (around 11/01/2016) for Follow-up of gastritis.   [name] MD

## 2016-09-20 NOTE — Patient Instructions (Signed)
Gastritis, Adult Gastritis is inflammation of the stomach. There are two kinds of gastritis:  Acute gastritis. This kind develops suddenly.  Chronic gastritis. This kind lasts for a long time.  Gastritis happens when the lining of the stomach becomes weak or gets damaged. Without treatment, gastritis can lead to stomach bleeding and ulcers. What are the causes? This condition may be caused by:  An infection.  Drinking too much alcohol.  Certain medicines.  Having too much acid in the stomach.  A disease of the intestines or stomach.  Stress.  What are the signs or symptoms? Symptoms of this condition include:  Pain or a burning in the upper abdomen.  Nausea.  Vomiting.  An uncomfortable feeling of fullness after eating.  In some cases, there are no symptoms. How is this diagnosed? This condition may be diagnosed with:  A description of your symptoms.  A physical exam.  Tests. These can include: ? Blood tests. ? Stool tests. ? A test in which a thin, flexible instrument with a light and camera on the end is passed down the esophagus and into the stomach (upper endoscopy). ? A test in which a sample of tissue is taken for testing (biopsy).  How is this treated? This condition may be treated with medicines. If the condition is caused by a bacterial infection, you may be given antibiotic medicines. If it is caused by too much acid in the stomach, you may get medicines called H2 blockers, proton pump inhibitors, or antacids. Treatment may also involve stopping the use of certain medicines, such as aspirin, ibuprofen, or other nonsteroidal anti-inflammatory drugs (NSAIDs). Follow these instructions at home:  Take over-the-counter and prescription medicines only as told by your health care provider.  If you were prescribed an antibiotic, take it as told by your health care provider. Do not stop taking the antibiotic even if you start to feel better.  Drink enough  fluid to keep your urine clear or pale yellow.  Eat small, frequent meals instead of large meals. Contact a health care provider if:  Your symptoms get worse.  Your symptoms return after treatment. Get help right away if:  You vomit blood or material that looks like coffee grounds.  You have black or dark red stools.  You are unable to keep fluids down.  Your abdominal pain gets worse.  You have a fever.  You do not feel better after 1 week. This information is not intended to replace advice given to you by your health care provider. Make sure you discuss any questions you have with your health care provider. Document Released: 12/26/2000 Document Revised: 08/31/2015 Document Reviewed: 09/25/2014 Elsevier Interactive Patient Education  2018 Elsevier Inc.  

## 2016-09-25 ENCOUNTER — Ambulatory Visit: Payer: Self-pay | Attending: Family Medicine | Admitting: Family Medicine

## 2016-09-25 ENCOUNTER — Encounter: Payer: Self-pay | Admitting: Family Medicine

## 2016-09-25 ENCOUNTER — Other Ambulatory Visit (HOSPITAL_COMMUNITY)
Admission: RE | Admit: 2016-09-25 | Discharge: 2016-09-25 | Disposition: A | Discharge status: Home / Self Care | Payer: Medicaid Other | Source: Ambulatory Visit | Attending: Family Medicine | Admitting: Family Medicine

## 2016-09-25 VITALS — BP 115/77 | HR 73 | Temp 95.5°F | Ht 62.0 in | Wt 137.2 lb

## 2016-09-25 DIAGNOSIS — G4730 Sleep apnea, unspecified: Secondary | ICD-10-CM | POA: Insufficient documentation

## 2016-09-25 DIAGNOSIS — Z1239 Encounter for other screening for malignant neoplasm of breast: Secondary | ICD-10-CM

## 2016-09-25 DIAGNOSIS — M810 Age-related osteoporosis without current pathological fracture: Secondary | ICD-10-CM | POA: Insufficient documentation

## 2016-09-25 DIAGNOSIS — Z1211 Encounter for screening for malignant neoplasm of colon: Secondary | ICD-10-CM

## 2016-09-25 DIAGNOSIS — Z0000 Encounter for general adult medical examination without abnormal findings: Secondary | ICD-10-CM | POA: Insufficient documentation

## 2016-09-25 DIAGNOSIS — M5136 Other intervertebral disc degeneration, lumbar region: Secondary | ICD-10-CM | POA: Insufficient documentation

## 2016-09-25 DIAGNOSIS — J45909 Unspecified asthma, uncomplicated: Secondary | ICD-10-CM | POA: Insufficient documentation

## 2016-09-25 DIAGNOSIS — Z1231 Encounter for screening mammogram for malignant neoplasm of breast: Secondary | ICD-10-CM

## 2016-09-25 DIAGNOSIS — Z23 Encounter for immunization: Secondary | ICD-10-CM

## 2016-09-25 DIAGNOSIS — M069 Rheumatoid arthritis, unspecified: Secondary | ICD-10-CM | POA: Insufficient documentation

## 2016-09-25 DIAGNOSIS — Z124 Encounter for screening for malignant neoplasm of cervix: Secondary | ICD-10-CM

## 2016-09-25 DIAGNOSIS — Z79899 Other long term (current) drug therapy: Secondary | ICD-10-CM | POA: Insufficient documentation

## 2016-09-25 DIAGNOSIS — Z114 Encounter for screening for human immunodeficiency virus [HIV]: Secondary | ICD-10-CM

## 2016-09-25 NOTE — Progress Notes (Signed)
Subjective:  Patient ID: Amy Hall, female    DOB: February 07, 1965  Age: 51 y.o. MRN: [id number]  CC: Annual Exam   HPI Amy Hall presents for a complete physical exam.   Past Medical History:  Diagnosis Date  . Arthritis   . Asthma   . DDD (degenerative disc disease), lumbar   . Osteoporosis of lumbar spine   . Rheumatoid arthritis ([name])   . Sleep disorder     Past Surgical History:  Procedure Laterality Date  . ELBOW SURGERY    . ESOPHAGOGASTRODUODENOSCOPY (EGD) WITH PROPOFOL Left 04/05/2016   Procedure: ESOPHAGOGASTRODUODENOSCOPY (EGD) WITH PROPOFOL;  Surgeon: Arta Silence, MD;  Location: [facility] ENDOSCOPY;  Service: Endoscopy;  Laterality: Left;    Allergies  Allergen Reactions  . Gabapentin     More pain      Outpatient Medications Prior to Visit  Medication Sig Dispense Refill  . albuterol (PROVENTIL HFA;VENTOLIN HFA) 108 (90 Base) MCG/ACT inhaler Inhale 2 puffs into the lungs every 6 (six) hours as needed for wheezing or shortness of breath. 54 g 3  . amoxicillin (AMOXIL) 500 MG capsule Take 1 capsule (500 mg total) by mouth 3 (three) times daily. 30 capsule 0  . b complex-C-folic acid 1 MG capsule Take 1 capsule (1 mg total) by mouth daily. 30 capsule 0  . cetirizine (ZYRTEC) 10 MG tablet Take 1 tablet (10 mg total) by mouth daily. 30 tablet 2  . cyclobenzaprine (FLEXERIL) 5 MG tablet Take 1 tablet (5 mg total) by mouth 2 (two) times daily as needed for muscle spasms. 60 tablet 2  . dextromethorphan-guaiFENesin (MUCINEX DM) 30-600 MG 12hr tablet Take 1 tablet by mouth 2 (two) times daily as needed for cough. 20 tablet 0  . predniSONE (DELTASONE) 10 MG tablet TAKE 1 TABLET BY MOUTH TWICE DAILY WITH A MEAL 60 tablet 1  . acyclovir ointment (ZOVIRAX) 5 % Apply 1 application topically every 6 (six) hours as needed. (Patient not taking: Reported on 09/25/2016) 15 g 0  . doxycycline (VIBRAMYCIN) 50 MG capsule Take 2 capsules (100 mg total) by mouth 2 (two) times  daily. (Patient not taking: Reported on 08/14/2016) 28 capsule 0  . magic mouthwash w/lidocaine SOLN Take 5 mLs by mouth 3 (three) times daily as needed for mouth pain. (Patient not taking: Reported on 04/09/2016) 100 mL 0  . pantoprazole (PROTONIX) 40 MG tablet Take 1 tablet (40 mg total) by mouth daily. (Patient not taking: Reported on 09/25/2016) 30 tablet 2  . polyethylene glycol (MIRALAX / GLYCOLAX) packet Take 17 g by mouth daily. (Patient not taking: Reported on 09/25/2016) 14 each 0  . sucralfate (CARAFATE) 1 GM/10ML suspension Take 10 mLs (1 g total) by mouth 4 (four) times daily -  with meals and at bedtime. (Patient not taking: Reported on 04/09/2016) 420 mL 0   No facility-administered medications prior to visit.     ROS Review of Systems  Constitutional: Negative for activity change, appetite change and fatigue.  HENT: Negative for congestion, sinus pressure and sore throat.   Eyes: Negative for visual disturbance.  Respiratory: Negative for cough, chest tightness, shortness of breath and wheezing.   Cardiovascular: Negative for chest pain and palpitations.  Gastrointestinal: Negative for abdominal distention, abdominal pain and constipation.  Endocrine: Negative for polydipsia.  Genitourinary: Negative for dysuria and frequency.  Musculoskeletal: Positive for arthralgias.  Skin: Negative for rash.  Neurological: Negative for tremors, light-headedness and numbness.  Hematological: Does not bruise/bleed easily.  Psychiatric/Behavioral: Negative for agitation  and behavioral problems.    Objective:  BP 115/77   Pulse 73   Temp (!) 95.5 F (35.3 C) (Oral)   Ht 5\' 2"  (1.575 m)   Wt 137 lb 3.2 oz (62.2 kg)   LMP 01/16/2015   SpO2 100%   BMI 25.09 kg/m   BP/Weight 09/25/2016 09/20/2016 10/08/2681  Systolic BP 419 622 297  Diastolic BP 77 75 75  Wt. (Lbs) 137.2 135 136  BMI 25.09 24.69 24.87      Physical Exam  Constitutional: She is oriented to person, place, and time.  She appears well-developed and well-nourished. No distress.  HENT:  Head: Normocephalic.  Right Ear: External ear normal.  Left Ear: External ear normal.  Nose: Nose normal.  Mouth/Throat: Oropharynx is clear and moist.  Eyes: Pupils are equal, round, and reactive to light. Conjunctivae and EOM are normal.  Neck: Normal range of motion. No JVD present.  Cardiovascular: Normal rate, regular rhythm, normal heart sounds and intact distal pulses.  Exam reveals no gallop.   No murmur heard. Pulmonary/Chest: Effort normal and breath sounds normal. No respiratory distress. She has no wheezes. She has no rales. She exhibits no tenderness. Right breast exhibits no mass and no tenderness. Left breast exhibits no mass and no tenderness.  Abdominal: Soft. Bowel sounds are normal. She exhibits no distension and no mass. There is no tenderness.  Genitourinary:  Genitourinary Comments: Normal external genitalia, normal vagina, normal cervix, normal adnexa  Musculoskeletal:  Rheumatoid deformities in her fingers and toes  Neurological: She is alert and oriented to person, place, and time. She has normal reflexes.  Skin: Skin is warm and dry. She is not diaphoretic.  Psychiatric: She has a normal mood and affect.     Assessment & Plan:   1. Annual physical exam Counseled on 150 minutes of exercise every week, healthy eating, routine healthcare maintenance. Received flu shot at [facility]  2. Screening for cervical cancer - Cytology - PAP [name]  3. Screening for breast cancer - MM Digital Screening; Future  4. Screening for colon cancer - Ambulatory referral to Gastroenterology  5. Need for Tdap vaccination Tdap administered  6. Encounter for screening for HIV - HIV antibody (with reflex)   No orders of the defined types were placed in this encounter.   Follow-up: Return in about 3 months (around 12/25/2016) for Follow-up of chronic medical conditions.    [name] MD

## 2016-09-26 ENCOUNTER — Telehealth: Payer: Self-pay | Admitting: Family Medicine

## 2016-09-26 LAB — H. PYLORI BREATH TEST: H PYLORI BREATH TEST: NEGATIVE

## 2016-09-26 LAB — HIV ANTIBODY (ROUTINE TESTING W REFLEX): HIV Screen 4th Generation wRfx: NONREACTIVE

## 2016-09-26 NOTE — Telephone Encounter (Signed)
Noted  

## 2016-09-26 NOTE — Telephone Encounter (Signed)
Pt called to request the lab result, please she want her to call her when the result are back please follow up

## 2016-09-27 LAB — CYTOLOGY - PAP
DIAGNOSIS: NEGATIVE
HPV: NOT DETECTED

## 2016-09-27 NOTE — Telephone Encounter (Signed)
Pt was called and informed of lab results. 

## 2016-10-01 ENCOUNTER — Other Ambulatory Visit: Payer: Self-pay | Admitting: Family Medicine

## 2016-10-01 MED ORDER — METRONIDAZOLE 0.75 % VA GEL: 1.0000 | Freq: Every day | VAGINAL | 0 refills | Status: DC

## 2016-10-01 MED FILL — VANDAZOLE VAGINAL 0.75% GEL: 0.75 | 5 days supply | Qty: 70 | Fill #0

## 2016-10-04 NOTE — Telephone Encounter (Signed)
Pt was called and informed of lab results and medication being sent to pharmacy.

## 2016-10-06 ENCOUNTER — Inpatient Hospital Stay (HOSPITAL_COMMUNITY)
Admission: EM | Admit: 2016-10-06 | Discharge: 2016-10-08 | DRG: 872 | Disposition: A | Discharge status: Home / Self Care | Payer: Self-pay | Attending: Internal Medicine | Admitting: Internal Medicine

## 2016-10-06 ENCOUNTER — Encounter (HOSPITAL_COMMUNITY): Payer: Self-pay | Admitting: Emergency Medicine

## 2016-10-06 ENCOUNTER — Emergency Department (HOSPITAL_COMMUNITY): Payer: Medicaid Other

## 2016-10-06 DIAGNOSIS — Z888 Allergy status to other drugs, medicaments and biological substances status: Secondary | ICD-10-CM

## 2016-10-06 DIAGNOSIS — T380X5A Adverse effect of glucocorticoids and synthetic analogues, initial encounter: Secondary | ICD-10-CM | POA: Diagnosis present

## 2016-10-06 DIAGNOSIS — R739 Hyperglycemia, unspecified: Secondary | ICD-10-CM | POA: Diagnosis present

## 2016-10-06 DIAGNOSIS — Z79899 Other long term (current) drug therapy: Secondary | ICD-10-CM

## 2016-10-06 DIAGNOSIS — J45909 Unspecified asthma, uncomplicated: Secondary | ICD-10-CM | POA: Diagnosis present

## 2016-10-06 DIAGNOSIS — F17210 Nicotine dependence, cigarettes, uncomplicated: Secondary | ICD-10-CM | POA: Diagnosis present

## 2016-10-06 DIAGNOSIS — M069 Rheumatoid arthritis, unspecified: Secondary | ICD-10-CM | POA: Diagnosis present

## 2016-10-06 DIAGNOSIS — Z7983 Long term (current) use of bisphosphonates: Secondary | ICD-10-CM

## 2016-10-06 DIAGNOSIS — Z7952 Long term (current) use of systemic steroids: Secondary | ICD-10-CM

## 2016-10-06 DIAGNOSIS — M7989 Other specified soft tissue disorders: Secondary | ICD-10-CM | POA: Diagnosis present

## 2016-10-06 DIAGNOSIS — N760 Acute vaginitis: Secondary | ICD-10-CM | POA: Diagnosis present

## 2016-10-06 DIAGNOSIS — I959 Hypotension, unspecified: Secondary | ICD-10-CM | POA: Diagnosis present

## 2016-10-06 DIAGNOSIS — D6489 Other specified anemias: Secondary | ICD-10-CM | POA: Diagnosis present

## 2016-10-06 DIAGNOSIS — M810 Age-related osteoporosis without current pathological fracture: Secondary | ICD-10-CM | POA: Diagnosis present

## 2016-10-06 DIAGNOSIS — G479 Sleep disorder, unspecified: Secondary | ICD-10-CM | POA: Diagnosis present

## 2016-10-06 DIAGNOSIS — N390 Urinary tract infection, site not specified: Secondary | ICD-10-CM | POA: Diagnosis present

## 2016-10-06 DIAGNOSIS — D649 Anemia, unspecified: Secondary | ICD-10-CM | POA: Diagnosis present

## 2016-10-06 DIAGNOSIS — B3781 Candidal esophagitis: Secondary | ICD-10-CM

## 2016-10-06 DIAGNOSIS — B370 Candidal stomatitis: Secondary | ICD-10-CM | POA: Diagnosis present

## 2016-10-06 DIAGNOSIS — A419 Sepsis, unspecified organism: Principal | ICD-10-CM | POA: Diagnosis present

## 2016-10-06 LAB — HEPATIC FUNCTION PANEL
ALK PHOS: 45 U/L (ref 38–126)
ALT: 28 U/L (ref 14–54)
AST: 36 U/L (ref 15–41)
Albumin: 3 g/dL — ABNORMAL LOW (ref 3.5–5.0)
BILIRUBIN DIRECT: 0.2 mg/dL (ref 0.1–0.5)
BILIRUBIN INDIRECT: 0.3 mg/dL (ref 0.3–0.9)
BILIRUBIN TOTAL: 0.5 mg/dL (ref 0.3–1.2)
TOTAL PROTEIN: 5.9 g/dL — AB (ref 6.5–8.1)

## 2016-10-06 LAB — CBC WITH DIFFERENTIAL/PLATELET
BASOS PCT: 0 %
Basophils Absolute: 0 10*3/uL (ref 0.0–0.1)
EOS PCT: 1 %
Eosinophils Absolute: 0.1 10*3/uL (ref 0.0–0.7)
HCT: 32 % — ABNORMAL LOW (ref 36.0–46.0)
HEMOGLOBIN: 8.8 g/dL — AB (ref 12.0–15.0)
LYMPHS ABS: 0.8 10*3/uL (ref 0.7–4.0)
LYMPHS PCT: 6 %
MCH: 19.9 pg — AB (ref 26.0–34.0)
MCHC: 27.5 g/dL — AB (ref 30.0–36.0)
MCV: 72.4 fL — ABNORMAL LOW (ref 78.0–100.0)
MONOS PCT: 6 %
Monocytes Absolute: 0.8 10*3/uL (ref 0.1–1.0)
Neutro Abs: 11.6 10*3/uL — ABNORMAL HIGH (ref 1.7–7.7)
Neutrophils Relative %: 87 %
Platelets: 364 10*3/uL (ref 150–400)
RBC: 4.42 MIL/uL (ref 3.87–5.11)
RDW: 16.3 % — ABNORMAL HIGH (ref 11.5–15.5)
WBC: 13.3 10*3/uL — ABNORMAL HIGH (ref 4.0–10.5)

## 2016-10-06 LAB — URINALYSIS, ROUTINE W REFLEX MICROSCOPIC
BILIRUBIN URINE: NEGATIVE
Glucose, UA: NEGATIVE mg/dL
KETONES UR: NEGATIVE mg/dL
NITRITE: NEGATIVE
PH: 7 (ref 5.0–8.0)
Protein, ur: NEGATIVE mg/dL
SPECIFIC GRAVITY, URINE: 1.002 — AB (ref 1.005–1.030)

## 2016-10-06 LAB — BASIC METABOLIC PANEL
Anion gap: 8 (ref 5–15)
CHLORIDE: 104 mmol/L (ref 101–111)
CO2: 20 mmol/L — AB (ref 22–32)
CREATININE: 0.65 mg/dL (ref 0.44–1.00)
Calcium: 8.4 mg/dL — ABNORMAL LOW (ref 8.9–10.3)
GFR calc Af Amer: >60 mL/min (ref >60)
GFR calc non Af Amer: >60 mL/min (ref >60)
GLUCOSE: 156 mg/dL — AB (ref 65–99)
Potassium: 3.6 mmol/L (ref 3.5–5.1)
SODIUM: 132 mmol/L — AB (ref 135–145)

## 2016-10-06 LAB — I-STAT CG4 LACTIC ACID, ED: LACTIC ACID, VENOUS: 1.52 mmol/L (ref 0.5–1.9)

## 2016-10-06 MED ORDER — HYDROCORTISONE NA SUCCINATE PF 100 MG IJ SOLR
50.0000 mg | Freq: Three times a day (TID) | INTRAMUSCULAR | Status: DC
  Administered 2016-10-07 – 2016-10-08 (×4): 50 mg via INTRAVENOUS
  Filled 2016-10-06 (×4): qty 2

## 2016-10-06 MED ORDER — VANCOMYCIN HCL 10 G IV SOLR
1250.0000 mg | Freq: Once | INTRAVENOUS | Status: AC
  Administered 2016-10-06: 1250 mg via INTRAVENOUS
  Filled 2016-10-06: qty 1250

## 2016-10-06 MED ORDER — IPRATROPIUM-ALBUTEROL 0.5-2.5 (3) MG/3ML IN SOLN
3.0000 mL | Freq: Once | RESPIRATORY_TRACT | Status: AC
  Administered 2016-10-06: 3 mL via RESPIRATORY_TRACT
  Filled 2016-10-06: qty 3

## 2016-10-06 MED ORDER — SODIUM CHLORIDE 0.9 % IV BOLUS (SEPSIS): 1000.0000 mL | Freq: Once | INTRAVENOUS | Status: DC

## 2016-10-06 MED ORDER — ACETAMINOPHEN 500 MG PO TABS
1000.0000 mg | ORAL_TABLET | Freq: Once | ORAL | Status: AC
  Administered 2016-10-06: 1000 mg via ORAL
  Filled 2016-10-06: qty 2

## 2016-10-06 MED ORDER — ONDANSETRON HCL 4 MG PO TABS: 4.0000 mg | ORAL_TABLET | Freq: Four times a day (QID) | ORAL | Status: DC | PRN

## 2016-10-06 MED ORDER — DOXYCYCLINE HYCLATE 100 MG IV SOLR
100.0000 mg | Freq: Once | INTRAVENOUS | Status: AC
  Administered 2016-10-06: 100 mg via INTRAVENOUS
  Filled 2016-10-06: qty 100

## 2016-10-06 MED ORDER — ONDANSETRON HCL 4 MG/2ML IJ SOLN: 4.0000 mg | Freq: Four times a day (QID) | INTRAMUSCULAR | Status: DC | PRN

## 2016-10-06 MED ORDER — ALBUTEROL SULFATE (2.5 MG/3ML) 0.083% IN NEBU: 3.0000 mL | INHALATION_SOLUTION | Freq: Four times a day (QID) | RESPIRATORY_TRACT | Status: DC | PRN

## 2016-10-06 MED ORDER — ACETAMINOPHEN 325 MG PO TABS: 650.0000 mg | ORAL_TABLET | Freq: Four times a day (QID) | ORAL | Status: DC | PRN

## 2016-10-06 MED ORDER — PIPERACILLIN-TAZOBACTAM 3.375 G IVPB
3.3750 g | Freq: Three times a day (TID) | INTRAVENOUS | Status: DC
  Administered 2016-10-07 – 2016-10-08 (×4): 3.375 g via INTRAVENOUS
  Filled 2016-10-06 (×5): qty 50

## 2016-10-06 MED ORDER — NYSTATIN 100000 UNIT/ML MT SUSP
5.0000 mL | Freq: Four times a day (QID) | OROMUCOSAL | Status: DC
  Administered 2016-10-07 – 2016-10-08 (×5): 500000 [IU] via ORAL
  Filled 2016-10-06 (×5): qty 5

## 2016-10-06 MED ORDER — SODIUM CHLORIDE 0.9 % IV BOLUS (SEPSIS)
1000.0000 mL | Freq: Once | INTRAVENOUS | Status: AC
  Administered 2016-10-06: 1000 mL via INTRAVENOUS

## 2016-10-06 MED ORDER — HYDROCORTISONE NA SUCCINATE PF 100 MG IJ SOLR
100.0000 mg | Freq: Once | INTRAMUSCULAR | Status: AC
  Administered 2016-10-06: 100 mg via INTRAVENOUS
  Filled 2016-10-06: qty 2

## 2016-10-06 MED ORDER — PIPERACILLIN-TAZOBACTAM 3.375 G IVPB 30 MIN
3.3750 g | Freq: Once | INTRAVENOUS | Status: AC
  Administered 2016-10-06: 3.375 g via INTRAVENOUS
  Filled 2016-10-06: qty 50

## 2016-10-06 MED ORDER — PANTOPRAZOLE SODIUM 40 MG PO TBEC
80.0000 mg | DELAYED_RELEASE_TABLET | Freq: Every day | ORAL | Status: DC
  Administered 2016-10-07 – 2016-10-08 (×2): 80 mg via ORAL
  Filled 2016-10-06 (×2): qty 2

## 2016-10-06 MED ORDER — ACETAMINOPHEN 650 MG RE SUPP: 650.0000 mg | Freq: Four times a day (QID) | RECTAL | Status: DC | PRN

## 2016-10-06 MED ORDER — ENOXAPARIN SODIUM 40 MG/0.4ML ~~LOC~~ SOLN
40.0000 mg | SUBCUTANEOUS | Status: DC
  Administered 2016-10-07 – 2016-10-08 (×2): 40 mg via SUBCUTANEOUS
  Filled 2016-10-06 (×2): qty 0.4

## 2016-10-06 MED ORDER — LACTATED RINGERS IV BOLUS (SEPSIS)
1000.0000 mL | Freq: Once | INTRAVENOUS | Status: AC
  Administered 2016-10-06: 1000 mL via INTRAVENOUS

## 2016-10-06 MED ORDER — VANCOMYCIN HCL IN DEXTROSE 750-5 MG/150ML-% IV SOLN
750.0000 mg | Freq: Two times a day (BID) | INTRAVENOUS | Status: DC
  Filled 2016-10-06: qty 150

## 2016-10-06 NOTE — ED Triage Notes (Signed)
Pt has multiple complaints, she complains of blisters in her mouth that she has been seen for previously, given medications without relief, pt also has rheumatoid arthritis, and her hands are swelling more than usual.

## 2016-10-06 NOTE — Progress Notes (Signed)
Pharmacy Antibiotic Note  Amy Hall is a 51 y.o. female admitted on 10/06/2016 with sepsis.  Pharmacy has been consulted for vancomycin and zosyn dosing. WBC 13.3, Tmax 101.3  Plan: Vancomycin 1250mg  IV once then 750 mg IV every 12 hours.  Goal trough 15-20 mcg/mL. Zosyn 3.375g IV q8h (4 hour infusion). Monitor clinical progression and LOT     Temp (24hrs), Avg:101 F (38.3 C), Min:101 F (38.3 C), Max:101 F (38.3 C)   Recent Labs Lab 10/06/16 1902  WBC 13.3*  CREATININE 0.65    Estimated Creatinine Clearance: 71.3 mL/min (by C-G formula based on SCr of 0.65 mg/dL).    Allergies  Allergen Reactions  . Gabapentin     More pain     Thank you for allowing pharmacy to be a part of this patient's care.  Jodean Lima Reiana Poteet 10/06/2016 8:24 PM

## 2016-10-06 NOTE — ED Provider Notes (Signed)
[facility] DEPT Provider Note   CSN: [id number] Arrival date & time: 10/06/16  1817     History   Chief Complaint Chief Complaint  Patient presents with  . Rheumatoid Arthritis    HPI Amy Hall is a 51 y.o. female.  HPI   51 yo F with h/o RA, IDA, asthma, here with multiple complaints. Pt reports that she has felt "awful" for the last week. Her sx started with diffuse body aches and general fatigue. Over the last week, she has noticed persistent dysuria and urinary frequency, along with increased frequency. She has also felt like her RA is "flaring" and has had right hand swelling and pain, also she has noticed mouth sores with poor taste x several days. She has a mild sore throat but no difficulty swallowing. She has also had a dry, non-productive cough and wheezing. She did not know she had a fever until she arrived today. No nausea, vomiting, or diarrhea. No other abdominal pain. She does not a papular, red rash over her bilateral, L>R ankles but states this has been coming and going for "a while."  Past Medical History:  Diagnosis Date  . Arthritis   . Asthma   . DDD (degenerative disc disease), lumbar   . Osteoporosis of lumbar spine   . Rheumatoid arthritis ([place])   . Sleep disorder     Patient Active Problem List   Diagnosis Date Noted  . Sepsis associated hypotension ([place]) 10/06/2016  . Acute lower UTI 10/06/2016  . Thrush 10/06/2016  . Gastritis 09/20/2016  . Iron deficiency anemia due to chronic blood loss 04/06/2016  . B12 deficiency anemia 04/06/2016  . B12 deficiency   . GI bleed 04/04/2016  . Asthma exacerbation 04/03/2016  . Tobacco abuse 04/03/2016  . UTI (urinary tract infection) 04/03/2016  . Sepsis ([place]) 04/03/2016  . Symptomatic anemia 04/03/2016  . Allergic reaction 04/03/2016  . Anemia 04/03/2016  . Nausea & vomiting 04/03/2016  . Closed nondisplaced fracture of third metatarsal bone of left foot 12/05/2015  . Valgus deformity of great  toe 12/05/2015  . 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 Lumbar radiculitis 11/24/2015  . Rheumatoid arthritis ([place]) 09/02/2015  . Degenerative disc disease, lumbar 09/02/2015  . Spinal stenosis of lumbar region 09/02/2015    Past Surgical History:  Procedure Laterality Date  . ELBOW SURGERY    . ESOPHAGOGASTRODUODENOSCOPY (EGD) WITH PROPOFOL Left 04/05/2016   Procedure: ESOPHAGOGASTRODUODENOSCOPY (EGD) WITH PROPOFOL;  Surgeon: Arta Silence, MD;  Location: [facility] ENDOSCOPY;  Service: Endoscopy;  Laterality: Left;    OB History    No data available       Home Medications    Prior to Admission medications   Medication Sig Start Date End Date Taking? Authorizing Provider  albuterol (PROVENTIL HFA;VENTOLIN HFA) 108 (90 Base) MCG/ACT inhaler Inhale 2 puffs into the lungs every 6 (six) hours as needed for wheezing or shortness of breath. 03/20/16  Yes [name], MD  alendronate (FOSAMAX) 70 MG tablet Take 70 mg by mouth once a week. 09/14/16  Yes [provider]  b complex-C-folic acid 1 MG capsule Take 1 capsule (1 mg total) by mouth daily. 04/06/16  Yes Rosita Fire, MD  cetirizine (ZYRTEC) 10 MG tablet Take 1 tablet (10 mg total) by mouth daily. 03/12/16  Yes [name], MD  cyclobenzaprine (FLEXERIL) 5 MG tablet Take 1 tablet (5 mg total) by mouth 2 (two) times daily as needed for muscle spasms. Patient taking differently: Take 5 mg  by mouth 2 (two) times daily.  08/14/16  Yes [name], MD  dextromethorphan-guaiFENesin (MUCINEX DM) 30-600 MG 12hr tablet Take 1 tablet by mouth 2 (two) times daily as needed for cough. 04/06/16  Yes Rosita Fire, MD  methotrexate (RHEUMATREX) 2.5 MG tablet Take 15 mg by mouth once a week. 09/14/16  Yes [provider]  omeprazole (PRILOSEC) 40 MG capsule Take 40 mg by mouth daily. 09/14/16  Yes [provider]  predniSONE (DELTASONE) 10 MG tablet TAKE 1 TABLET BY MOUTH TWICE DAILY WITH A MEAL Patient taking  differently: Take 10 mg by mouth 2 (two) times daily with a meal.  08/14/16  Yes [name], MD    Family History Family History  Problem Relation Age of Onset  . Anemia Mother   . Hypertension Father   . Heart disease Father   . Hypertension Sister     Social History Social History  Substance Use Topics  . Smoking status: Current Every Day Smoker    Packs/day: 1.00    Types: Cigarettes  . Smokeless tobacco: Never Used  . Alcohol use 0.6 - 1.2 oz/week    1 - 2 Cans of beer per week     Comment: occ     Allergies   Gabapentin   Review of Systems Review of Systems  Constitutional: Positive for chills, fatigue and fever.  HENT: Positive for congestion.   Respiratory: Positive for cough.   Genitourinary: Positive for dysuria and frequency.  Musculoskeletal: Positive for myalgias.  Skin: Positive for rash.  Neurological: Positive for weakness.  All other systems reviewed and are negative.    Physical Exam Updated Vital Signs BP (!) 88/71 (BP Location: Left Arm)   Pulse 64   Temp 98.2 F (36.8 C) (Oral)   Resp 18   Ht 5\' 2"  (1.575 m)   Wt 64 kg (141 lb 1.5 oz)   LMP 01/16/2015   SpO2 100%   BMI 25.81 kg/m   Physical Exam  Constitutional: She is oriented to person, place, and time. She appears well-developed and well-nourished. She appears distressed.  Chronically ill-appearing, appears older than stated age  HENT:  Head: Normocephalic and atraumatic.  Mild thrush/white plaques throughout mouth, with easily removed plaques overlying erythematous bases. No overt ulcerations.  Eyes: Conjunctivae are normal.  Neck: Neck supple.  Cardiovascular: Regular rhythm and normal heart sounds.  Tachycardia present.  Exam reveals no friction rub.   No murmur heard. Pulmonary/Chest: Effort normal. Tachypnea noted. No respiratory distress. She has wheezes. She has no rales.  Abdominal: Soft. She exhibits no distension (mild, suprapubic; no CVAT). There is no rebound and  no guarding.  Musculoskeletal: She exhibits no edema.  Neurological: She is alert and oriented to person, place, and time. She exhibits normal muscle tone.  Skin: Skin is warm. Capillary refill takes less than 2 seconds. Rash (papular, red, non-painful rash to left ankle) noted.  Psychiatric: She has a normal mood and affect.  Nursing note and vitals reviewed.    ED Treatments / Results  Labs (all labs ordered are listed, but only abnormal results are displayed) Labs Reviewed  CBC WITH DIFFERENTIAL/PLATELET - Abnormal; Notable for the following:       Result Value   WBC 13.3 (*)    Hemoglobin 8.8 (*)    HCT 32.0 (*)    MCV 72.4 (*)    MCH 19.9 (*)    MCHC 27.5 (*)    RDW 16.3 (*)    Neutro  Abs 11.6 (*)    All other components within normal limits  BASIC METABOLIC PANEL - Abnormal; Notable for the following:    Sodium 132 (*)    CO2 20 (*)    Glucose, Bld 156 (*)    BUN <5 (*)    Calcium 8.4 (*)    All other components within normal limits  HEPATIC FUNCTION PANEL - Abnormal; Notable for the following:    Total Protein 5.9 (*)    Albumin 3.0 (*)    All other components within normal limits  URINALYSIS, ROUTINE W REFLEX MICROSCOPIC - Abnormal; Notable for the following:    Color, Urine STRAW (*)    APPearance HAZY (*)    Specific Gravity, Urine 1.002 (*)    Hgb urine dipstick SMALL (*)    Leukocytes, UA LARGE (*)    Bacteria, UA RARE (*)    Squamous Epithelial / LPF 0-5 (*)    All other components within normal limits  CBC - Abnormal; Notable for the following:    Hemoglobin 7.7 (*)    HCT 28.4 (*)    MCV 73.2 (*)    MCH 19.8 (*)    MCHC 27.1 (*)    RDW 16.4 (*)    All other components within normal limits  BASIC METABOLIC PANEL - Abnormal; Notable for the following:    Chloride 113 (*)    CO2 20 (*)    Glucose, Bld 180 (*)    BUN <5 (*)    Calcium 7.4 (*)    All other components within normal limits  MRSA PCR SCREENING  CULTURE, BLOOD (ROUTINE X 2)  CULTURE,  BLOOD (ROUTINE X 2)  URINE CULTURE  VITAMIN B12  FOLATE  IRON AND TIBC  FERRITIN  RETICULOCYTES  ROCKY MTN SPOTTED FVR ABS PNL(IGG+IGM)  I-STAT CG4 LACTIC ACID, ED  I-STAT CG4 LACTIC ACID, ED  I-STAT CG4 LACTIC ACID, ED  CG4 I-STAT (LACTIC ACID)    EKG  EKG Interpretation  Date/Time:  Saturday October 06 2016 20:18:18 EDT Ventricular Rate:  102 PR Interval:    QRS Duration: 78 QT Interval:  355 QTC Calculation: 463 R Axis:   87 Text Interpretation:  Sinus tachycardia Probable left atrial enlargement When compared with ECG of 08/04/2016, No significant change was found Confirmed by Delora Fuel ([id number]) on 10/06/2016 11:48:21 PM       Radiology Dg Chest 2 View  Result Date: 10/06/2016 CLINICAL DATA:  Multiple complaints. Oral blisters, rheumatoid arthritis, hand swelling. Code sepsis. EXAM: CHEST  2 VIEW COMPARISON:  Chest radiograph August 04, 2016 FINDINGS: Cardiomediastinal silhouette is normal. Small to moderate hiatal hernia. Similar bronchitic changes without pleural effusion or focal consolidation. No pneumothorax. Old moderate L1 compression fracture. IMPRESSION: Similar bronchitic changes. Small to moderate hiatal hernia. Electronically Signed   By: Elon Alas M.D.   On: 10/06/2016 20:55   Dg Hand Complete Right  Result Date: 10/06/2016 CLINICAL DATA:  Rheumatoid arthritis. More hand swelling than usual. EXAM: RIGHT HAND - COMPLETE 3+ VIEW COMPARISON:  None. FINDINGS: Mild degenerate change of the radiocarpal joint and distal radioulnar joints. Mild degenerate changes over the carpal bones with mild widening of the scapholunate joint space. Persistent flexion over the interphalangeal joints. Mild periarticular osteopenia. No evidence of bony erosions. Mild degenerate change of the first MCP joint. Suggestion of an old fifth metacarpal fracture. No acute fracture or dislocation. IMPRESSION: Degenerative changes as described most prominent over the wrist. No definite  erosive change. Electronically Signed  By: Marin Olp M.D.   On: 10/06/2016 21:46    Procedures .Critical Care Performed by: Duffy Bruce Authorized by: Duffy Bruce   Critical care provider statement:    Critical care time (minutes):  35   Critical care time was exclusive of:  Separately billable procedures and treating other patients and teaching time   Critical care was necessary to treat or prevent imminent or life-threatening deterioration of the following conditions:  Circulatory failure, sepsis and shock   Critical care was time spent personally by me on the following activities:  Development of treatment plan with patient or surrogate, discussions with consultants, evaluation of patient's response to treatment, examination of patient, obtaining history from patient or surrogate, ordering and performing treatments and interventions, ordering and review of laboratory studies, ordering and review of radiographic studies, pulse oximetry, re-evaluation of patient's condition and review of old charts   I assumed direction of critical care for this patient from another provider in my specialty: no      (including critical care time)  Medications Ordered in ED Medications  piperacillin-tazobactam (ZOSYN) IVPB 3.375 g (0 g Intravenous Stopped 10/07/16 0915)  nystatin (MYCOSTATIN) 100000 UNIT/ML suspension 500,000 Units (500,000 Units Oral Given 10/07/16 1014)  hydrocortisone sodium succinate (SOLU-CORTEF) 100 MG injection 50 mg (50 mg Intravenous Given 10/07/16 0505)  albuterol (PROVENTIL) (2.5 MG/3ML) 0.083% nebulizer solution 3 mL (not administered)  pantoprazole (PROTONIX) EC tablet 80 mg (80 mg Oral Given 10/07/16 1014)  acetaminophen (TYLENOL) tablet 650 mg (not administered)    Or  acetaminophen (TYLENOL) suppository 650 mg (not administered)  ondansetron (ZOFRAN) tablet 4 mg (not administered)    Or  ondansetron (ZOFRAN) injection 4 mg (not administered)  enoxaparin  (LOVENOX) injection 40 mg (not administered)  folic acid (FOLVITE) tablet 1 mg (1 mg Oral Given 10/07/16 1015)  B-complex with vitamin C tablet 1 tablet (1 tablet Oral Given 10/07/16 1014)  cyclobenzaprine (FLEXERIL) tablet 5 mg (5 mg Oral Given 10/07/16 1014)  acetaminophen (TYLENOL) tablet 1,000 mg (1,000 mg Oral Given 10/06/16 2028)  sodium chloride 0.9 % bolus 1,000 mL (0 mLs Intravenous Stopped 10/06/16 2050)    And  sodium chloride 0.9 % bolus 1,000 mL (0 mLs Intravenous Stopped 10/06/16 2118)  ipratropium-albuterol (DUONEB) 0.5-2.5 (3) MG/3ML nebulizer solution 3 mL (3 mLs Nebulization Given 10/06/16 2102)  hydrocortisone sodium succinate (SOLU-CORTEF) 100 MG injection 100 mg (100 mg Intravenous Given 10/06/16 2102)  doxycycline (VIBRAMYCIN) 100 mg in dextrose 5 % 250 mL IVPB (0 mg Intravenous Stopped 10/06/16 2253)  piperacillin-tazobactam (ZOSYN) IVPB 3.375 g (0 g Intravenous Stopped 10/06/16 2120)  vancomycin (VANCOCIN) 1,250 mg in sodium chloride 0.9 % 250 mL IVPB (0 mg Intravenous Stopped 10/06/16 2222)  lactated ringers bolus 1,000 mL (0 mLs Intravenous Stopped 10/07/16 0008)     Initial Impression / Assessment and Plan / ED Course  I have reviewed the triage vital signs and the nursing notes.  Pertinent labs & imaging results that were available during my care of the patient were reviewed by me and considered in my medical decision making (see chart for details).     51 yo F with h/o RA here with fever, chills, dysuria. Pt febrile, tachycardic, borderline hypotensive on arrival. Concern for sepsis. Code sepsis initiated and pt started on broad spectrum ABX, fluids. Hydrocortisone IV also given due to h/o chronic prednisone use, concern for adrenal insufficiency. Suspect this is 2/2 UTI - she has sx consistent with this and UA is c/w UTI. She  does have some R hand swelling but no erythema, no lesions, suspect this is 2/2 her RA. Regarding her mouth sores, exam is c/w candida. She has no  signs of SJS/TEN. Will place on nystatin, continue empiric ABX, and admit.  Final Clinical Impressions(s) / ED Diagnoses   Final diagnoses:  Sepsis, due to unspecified organism [facility])    New Prescriptions Current Discharge Medication List       Duffy Bruce, MD 10/07/16 [phone]

## 2016-10-06 NOTE — ED Notes (Signed)
[facility] contacted to activate Code Sepsis 

## 2016-10-07 DIAGNOSIS — B370 Candidal stomatitis: Secondary | ICD-10-CM

## 2016-10-07 DIAGNOSIS — A419 Sepsis, unspecified organism: Principal | ICD-10-CM

## 2016-10-07 DIAGNOSIS — N390 Urinary tract infection, site not specified: Secondary | ICD-10-CM

## 2016-10-07 LAB — FOLATE: FOLATE: 21.7 ng/mL (ref >5.9)

## 2016-10-07 LAB — CBC
HEMATOCRIT: 28.4 % — AB (ref 36.0–46.0)
Hemoglobin: 7.7 g/dL — ABNORMAL LOW (ref 12.0–15.0)
MCH: 19.8 pg — ABNORMAL LOW (ref 26.0–34.0)
MCHC: 27.1 g/dL — AB (ref 30.0–36.0)
MCV: 73.2 fL — AB (ref 78.0–100.0)
Platelets: 309 10*3/uL (ref 150–400)
RBC: 3.88 MIL/uL (ref 3.87–5.11)
RDW: 16.4 % — AB (ref 11.5–15.5)
WBC: 8.4 10*3/uL (ref 4.0–10.5)

## 2016-10-07 LAB — RETICULOCYTES
RBC.: 3.92 MIL/uL (ref 3.87–5.11)
Retic Count, Absolute: 51 10*3/uL (ref 19.0–186.0)
Retic Ct Pct: 1.3 % (ref 0.4–3.1)

## 2016-10-07 LAB — BASIC METABOLIC PANEL
Anion gap: 5 (ref 5–15)
BUN: <5 mg/dL — ABNORMAL LOW (ref 6–20)
CALCIUM: 7.4 mg/dL — AB (ref 8.9–10.3)
CO2: 20 mmol/L — AB (ref 22–32)
CREATININE: 0.58 mg/dL (ref 0.44–1.00)
Chloride: 113 mmol/L — ABNORMAL HIGH (ref 101–111)
GFR calc Af Amer: >60 mL/min (ref >60)
GFR calc non Af Amer: >60 mL/min (ref >60)
GLUCOSE: 180 mg/dL — AB (ref 65–99)
Potassium: 3.5 mmol/L (ref 3.5–5.1)
Sodium: 138 mmol/L (ref 135–145)

## 2016-10-07 LAB — IRON AND TIBC
IRON: 44 ug/dL (ref 28–170)
Saturation Ratios: 16 % (ref 10.4–31.8)
TIBC: 283 ug/dL (ref 250–450)
UIBC: 239 ug/dL

## 2016-10-07 LAB — VITAMIN B12: Vitamin B-12: 528 pg/mL (ref 180–914)

## 2016-10-07 LAB — CG4 I-STAT (LACTIC ACID): LACTIC ACID, VENOUS: 1.4 mmol/L (ref 0.5–1.9)

## 2016-10-07 LAB — MRSA PCR SCREENING: MRSA by PCR: NEGATIVE

## 2016-10-07 LAB — FERRITIN: FERRITIN: 21 ng/mL (ref 11–307)

## 2016-10-07 MED ORDER — LACTATED RINGERS IV SOLN
INTRAVENOUS | Status: DC
  Administered 2016-10-07: 75 mL/h via INTRAVENOUS

## 2016-10-07 MED ORDER — FOLIC ACID 1 MG PO TABS
1.0000 mg | ORAL_TABLET | Freq: Every day | ORAL | Status: DC
  Administered 2016-10-07 – 2016-10-08 (×2): 1 mg via ORAL
  Filled 2016-10-07 (×2): qty 1

## 2016-10-07 MED ORDER — CYCLOBENZAPRINE HCL 10 MG PO TABS
5.0000 mg | ORAL_TABLET | Freq: Two times a day (BID) | ORAL | Status: DC | PRN
  Administered 2016-10-07: 5 mg via ORAL
  Filled 2016-10-07: qty 1

## 2016-10-07 MED ORDER — B COMPLEX-C PO TABS
1.0000 | ORAL_TABLET | Freq: Every day | ORAL | Status: DC
  Administered 2016-10-07 – 2016-10-08 (×2): 1 via ORAL
  Filled 2016-10-07 (×2): qty 1

## 2016-10-07 NOTE — H&P (Addendum)
History and Physical    [name] BHA:[id number] DOB: [birth year] DOA: 10/06/2016  PCP: [name], MD  Patient coming from: Home  I have personally briefly reviewed patient's old medical records in [place]  Chief Complaint: Oral blisters, mouth swelling  HPI: Amy Hall is a 51 y.o. female with medical history significant of RA on chronic prednisone just got started on MTX this month it looks like.  Patient also had oral blisters in March this year (not on MTX) unknown cause, successfully resolved with steroids.  Patient reports 1 week history of dysuria.  Developed RA flare in that time frame with severe wrist swelling R>L.  Typically gets wrist swelling during RA flare though this is somewhat worse.  Symptoms are worsening, persistent.  Painful oral lesions.  Nothing makes symptoms better or worse.   ED Course: Tm 101, WBC 13.3k, initial BP 97D systolic, improves to 532 after IVF and steroids.   Review of Systems: As per HPI otherwise 10 point review of systems negative.   Past Medical History:  Diagnosis Date  . Arthritis   . Asthma   . DDD (degenerative disc disease), lumbar   . Osteoporosis of lumbar spine   . Rheumatoid arthritis ([place])   . Sleep disorder     Past Surgical History:  Procedure Laterality Date  . ELBOW SURGERY    . ESOPHAGOGASTRODUODENOSCOPY (EGD) WITH PROPOFOL Left 04/05/2016   Procedure: ESOPHAGOGASTRODUODENOSCOPY (EGD) WITH PROPOFOL;  Surgeon: Arta Silence, MD;  Location: [facility] ENDOSCOPY;  Service: Endoscopy;  Laterality: Left;     reports that she has been smoking Cigarettes.  She has been smoking about 1.00 pack per day. She has never used smokeless tobacco. She reports that she drinks about 0.6 - 1.2 oz of alcohol per week . She reports that she uses drugs, including Cocaine.  Allergies  Allergen Reactions  . Gabapentin     More pain     Family History  Problem Relation Age of Onset  . Anemia Mother   . Hypertension  Father   . Heart disease Father   . Hypertension Sister      Prior to Admission medications   Medication Sig Start Date End Date Taking? Authorizing Provider  albuterol (PROVENTIL HFA;VENTOLIN HFA) 108 (90 Base) MCG/ACT inhaler Inhale 2 puffs into the lungs every 6 (six) hours as needed for wheezing or shortness of breath. 03/20/16  Yes [name], MD  alendronate (FOSAMAX) 70 MG tablet Take 70 mg by mouth once a week. 09/14/16  Yes [provider]  b complex-C-folic acid 1 MG capsule Take 1 capsule (1 mg total) by mouth daily. 04/06/16  Yes Rosita Fire, MD  cetirizine (ZYRTEC) 10 MG tablet Take 1 tablet (10 mg total) by mouth daily. 03/12/16  Yes [name], MD  cyclobenzaprine (FLEXERIL) 5 MG tablet Take 1 tablet (5 mg total) by mouth 2 (two) times daily as needed for muscle spasms. Patient taking differently: Take 5 mg by mouth 2 (two) times daily.  08/14/16  Yes [name], MD  dextromethorphan-guaiFENesin (MUCINEX DM) 30-600 MG 12hr tablet Take 1 tablet by mouth 2 (two) times daily as needed for cough. 04/06/16  Yes Rosita Fire, MD  methotrexate (RHEUMATREX) 2.5 MG tablet Take 15 mg by mouth once a week. 09/14/16  Yes [provider]  omeprazole (PRILOSEC) 40 MG capsule Take 40 mg by mouth daily. 09/14/16  Yes [provider]  predniSONE (DELTASONE) 10 MG tablet TAKE 1 TABLET BY MOUTH TWICE DAILY WITH  A MEAL Patient taking differently: Take 10 mg by mouth 2 (two) times daily with a meal.  08/14/16  Yes [name], MD    Physical Exam: Vitals:   10/06/16 2200 10/06/16 2222 10/06/16 2230 10/06/16 2307  BP: 102/78  (!) 86/48 (!) 98/59  Pulse: 83  77 91  Resp: 19  (!) 25 16  Temp:  99.4 F (37.4 C)    TempSrc:  Oral    SpO2: 97%  95% 92%    Constitutional: NAD, calm, comfortable Eyes: PERRL, lids and conjunctivae normal ENMT: Thrush, dont see anything desquamating Neck: normal, supple, no masses, no thyromegaly Respiratory: clear  to auscultation bilaterally, no wheezing, no crackles. Normal respiratory effort. No accessory muscle use.  Cardiovascular: Regular rate and rhythm, no murmurs / rubs / gallops. No extremity edema. 2+ pedal pulses. No carotid bruits.  Abdomen: no tenderness, no masses palpated. No hepatosplenomegaly. Bowel sounds positive.  Musculoskeletal: no clubbing / cyanosis. No joint deformity upper and lower extremities. Good ROM, no contractures. Normal muscle tone.  Skin: no rashes, lesions, ulcers. No induration Neurologic: CN 2-12 grossly intact. Sensation intact, DTR normal. Strength 5/5 in all 4.  Psychiatric: Normal judgment and insight. Alert and oriented x 3. Normal mood.    Labs on Admission: I have personally reviewed following labs and imaging studies  CBC:  Recent Labs Lab 10/06/16 1902  WBC 13.3*  NEUTROABS 11.6*  HGB 8.8*  HCT 32.0*  MCV 72.4*  PLT 811   Basic Metabolic Panel:  Recent Labs Lab 10/06/16 1902  NA 132*  K 3.6  CL 104  CO2 20*  GLUCOSE 156*  BUN <5*  CREATININE 0.65  CALCIUM 8.4*   GFR: Estimated Creatinine Clearance: 71.3 mL/min (by C-G formula based on SCr of 0.65 mg/dL). Liver Function Tests:  Recent Labs Lab 10/06/16 2002  AST 36  ALT 28  ALKPHOS 45  BILITOT 0.5  PROT 5.9*  ALBUMIN 3.0*   No results for input(s): LIPASE, AMYLASE in the last 168 hours. No results for input(s): AMMONIA in the last 168 hours. Coagulation Profile: No results for input(s): INR, PROTIME in the last 168 hours. Cardiac Enzymes: No results for input(s): CKTOTAL, CKMB, CKMBINDEX, TROPONINI in the last 168 hours. BNP (last 3 results) No results for input(s): PROBNP in the last 8760 hours. HbA1C: No results for input(s): HGBA1C in the last 72 hours. CBG: No results for input(s): GLUCAP in the last 168 hours. Lipid Profile: No results for input(s): CHOL, HDL, LDLCALC, TRIG, CHOLHDL, LDLDIRECT in the last 72 hours. Thyroid Function Tests: No results for  input(s): TSH, T4TOTAL, FREET4, T3FREE, THYROIDAB in the last 72 hours. Anemia Panel: No results for input(s): VITAMINB12, FOLATE, FERRITIN, TIBC, IRON, RETICCTPCT in the last 72 hours. Urine analysis:    Component Value Date/Time   COLORURINE STRAW (A) 10/06/2016 2222   APPEARANCEUR HAZY (A) 10/06/2016 2222   LABSPEC 1.002 (L) 10/06/2016 2222   PHURINE 7.0 10/06/2016 2222   GLUCOSEU NEGATIVE 10/06/2016 2222   HGBUR SMALL (A) 10/06/2016 2222   BILIRUBINUR NEGATIVE 10/06/2016 2222   BILIRUBINUR negative 04/03/2016 [place] 10/06/2016 2222   PROTEINUR NEGATIVE 10/06/2016 2222   UROBILINOGEN 0.2 04/03/2016 1446   NITRITE NEGATIVE 10/06/2016 2222   LEUKOCYTESUR LARGE (A) 10/06/2016 2222    Radiological Exams on Admission: Dg Chest 2 View  Result Date: 10/06/2016 CLINICAL DATA:  Multiple complaints. Oral blisters, rheumatoid arthritis, hand swelling. Code sepsis. EXAM: CHEST  2 VIEW COMPARISON:  Chest radiograph  August 04, 2016 FINDINGS: Cardiomediastinal silhouette is normal. Small to moderate hiatal hernia. Similar bronchitic changes without pleural effusion or focal consolidation. No pneumothorax. Old moderate L1 compression fracture. IMPRESSION: Similar bronchitic changes. Small to moderate hiatal hernia. Electronically Signed   By: Elon Alas M.D.   On: 10/06/2016 20:55   Dg Hand Complete Right  Result Date: 10/06/2016 CLINICAL DATA:  Rheumatoid arthritis. More hand swelling than usual. EXAM: RIGHT HAND - COMPLETE 3+ VIEW COMPARISON:  None. FINDINGS: Mild degenerate change of the radiocarpal joint and distal radioulnar joints. Mild degenerate changes over the carpal bones with mild widening of the scapholunate joint space. Persistent flexion over the interphalangeal joints. Mild periarticular osteopenia. No evidence of bony erosions. Mild degenerate change of the first MCP joint. Suggestion of an old fifth metacarpal fracture. No acute fracture or dislocation.  IMPRESSION: Degenerative changes as described most prominent over the wrist. No definite erosive change. Electronically Signed   By: Marin Olp M.D.   On: 10/06/2016 21:46    EKG: Independently reviewed.  Assessment/Plan Principal Problem:   Sepsis associated hypotension (HCC) Active Problems:   Rheumatoid arthritis ([place])   Anemia   Acute lower UTI   Thrush    1. Sepsis with hypotension - in setting of UTI 1. IVF: Boluses in ED followed by 125 CC/HR NS 2. Empiric stress dose steroids 3. Will leave on zosyn/vanc for the moment given ongoing hypotension, narrow to rocephin when improved 4. BCx, UCx pending 5. Tele monitor 6. Tylenol PRN fever 2. RA - 1. Stress dose steroids 3. Oral thrush - 1. Nystatin 4. Anemia - possibly due to chronic GI blood loss? 1. Continue folic acid and Q67 (had to order separately since pharm doesn't carry the combo vitamin) 2. Anemia pnl ordered  DVT prophylaxis: Lovenox Code Status: Full Family Communication: Family at bedside Disposition Plan: Home after admit Consults called: None Admission status: Admit to inpatient   [place] [place] [facility] Pager [phone]  If 7AM-7PM, please contact day team taking care of patient [email or web address] Password [facility]  10/07/2016, 12:04 AM

## 2016-10-07 NOTE — Progress Notes (Signed)
PROGRESS NOTE    Amy Hall  HAL:[id number] DOB: [birth year] DOA: 10/06/2016 PCP: [name], MD    Brief Narrative:  51 year old female who presented with oral blisters and mouth swelling. Patient is known to have rheumatoid arthritis, chronic prednisone use, recently started on methotrexate. Reported 7 days of worsening arthritis, along with oral blisters. She presents to the hospital due to worsening symptoms. The initial physical examination showed a blood pressure 102/78, 86/48, 98/59, heart rate 83, 91, respiratory rate 19, 25, oxygen saturation 95%, temperature 101. Positive thrush, the lungs were clear to auscultation bilaterally, no wheezing, rales, or rhonchi, heart S1-S2 present and rhythmic, without murmurs, rubs or gallops, abdomen soft nontender, no lower extremity edema. His urinalysis had 60-30 white cells, negative proteins, negative nitrates. Chest x-ray, with no infiltrates, effusions or pneumothorax. EKG was sinus tachycardia, normal axis, normal intervals.  Patient was admitted to the stepdown unit for continued diagnosis of sepsis due to urinary tract infection.   Assessment & Plan:   Principal Problem:   Sepsis associated hypotension (HCC) Active Problems:   Rheumatoid arthritis ([place])   Anemia   Acute lower UTI   Thrush   1. Sepsis due to urinary tract infection present on admission. Urine analysis with no frank pyuria, will follow on cultures, for now will continue zosyn and will dc vancomycin, will continue to follow cell count and temperature curve, will continue genlte hydration with saline. Blood pressure 90 to 100, will keep MAP above 65. Continue high dose steroids for now, with rapid taper to home dose.   2. Rheumatoid arthritis. Patient follows up at [facility], on steroids and methotrexate, recent elevated ESR, no frank arthritis, will continue hold methotrexate for now, suspected possible drug reaction. Will resume cyclobenzaprine.   3. Thrush.  Will continue nystatin, will follow on response.   4. Multifactorial anemia. Stable hb and hct, no signs of bleeding or hemolysis.    DVT prophylaxis: enoxaparin  Code Status: full Family Communication:  Disposition Plan: home   Consultants:     Procedures:     Antimicrobials:   Zosyn and vancomycin.     Subjective: Patient feeling better, no nausea or vomiting, mild generalized weakness, no chest pain or dyspnea. Persistent oral ulcers and mouth pain.   Objective: Vitals:   10/07/16 0032 10/07/16 0045 10/07/16 0114 10/07/16 0500  BP:  101/65 103/68 (!) 93/55  Pulse:  75 78 84  Resp:  '17 19 15  '$ Temp: 98.4 F (36.9 C)  (!) 97.5 F (36.4 C) 98.1 F (36.7 C)  TempSrc: Oral  Oral Oral  SpO2:  100% 100% 99%  Weight:   64 kg (141 lb 1.5 oz)   Height:   '5\' 2"'$  (1.575 m)     Intake/Output Summary (Last 24 hours) at 10/07/16 7353 Last data filed at 10/07/16 0008  Gross per 24 hour  Intake             2550 ml  Output                0 ml  Net             2550 ml   Filed Weights   10/07/16 0114  Weight: 64 kg (141 lb 1.5 oz)    Examination:  General: deconditioned Neurology: Awake and alert, non focal  E ENT: mild pallor, no icterus, oral mucosa moist. Positive oral ulcers on the right  Cardiovascular: No JVD. S1-S2 present, rhythmic, no gallops, rubs, or murmurs. No lower  extremity edema. Pulmonary: vesicular breath sounds bilaterally, adequate air movement, no wheezing, rhonchi or rales. Gastrointestinal. Abdomen flat, no organomegaly, non tender, no rebound or guarding Skin. Erythematous plaques at the right distal leg. Non tender, no petechiae.  Musculoskeletal: wrist and hand deformities     Data Reviewed: I have personally reviewed following labs and imaging studies  CBC:  Recent Labs Lab 10/06/16 1902 10/07/16 0121  WBC 13.3* 8.4  NEUTROABS 11.6*  --   HGB 8.8* 7.7*  HCT 32.0* 28.4*  MCV 72.4* 73.2*  PLT 364 144   Basic Metabolic  Panel:  Recent Labs Lab 10/06/16 1902 10/07/16 0121  NA 132* 138  K 3.6 3.5  CL 104 113*  CO2 20* 20*  GLUCOSE 156* 180*  BUN <5* <5*  CREATININE 0.65 0.58  CALCIUM 8.4* 7.4*   GFR: Estimated Creatinine Clearance: 72.3 mL/min (by C-G formula based on SCr of 0.58 mg/dL). Liver Function Tests:  Recent Labs Lab 10/06/16 2002  AST 36  ALT 28  ALKPHOS 45  BILITOT 0.5  PROT 5.9*  ALBUMIN 3.0*   No results for input(s): LIPASE, AMYLASE in the last 168 hours. No results for input(s): AMMONIA in the last 168 hours. Coagulation Profile: No results for input(s): INR, PROTIME in the last 168 hours. Cardiac Enzymes: No results for input(s): CKTOTAL, CKMB, CKMBINDEX, TROPONINI in the last 168 hours. BNP (last 3 results) No results for input(s): PROBNP in the last 8760 hours. HbA1C: No results for input(s): HGBA1C in the last 72 hours. CBG: No results for input(s): GLUCAP in the last 168 hours. Lipid Profile: No results for input(s): CHOL, HDL, LDLCALC, TRIG, CHOLHDL, LDLDIRECT in the last 72 hours. Thyroid Function Tests: No results for input(s): TSH, T4TOTAL, FREET4, T3FREE, THYROIDAB in the last 72 hours. Anemia Panel:  Recent Labs  10/07/16 0121  VITAMINB12 528  FOLATE 21.7  FERRITIN 21  TIBC 283  IRON 44  RETICCTPCT 1.3      Radiology Studies: I have reviewed all of the imaging during this hospital visit personally     Scheduled Meds: . B-complex with vitamin C  1 tablet Oral Daily  . enoxaparin (LOVENOX) injection  40 mg Subcutaneous Q24H  . folic acid  1 mg Oral Daily  . hydrocortisone sod succinate (SOLU-CORTEF) inj  50 mg Intravenous Q8H  . nystatin  5 mL Oral QID  . pantoprazole  80 mg Oral Daily   Continuous Infusions: . piperacillin-tazobactam (ZOSYN)  IV 3.375 g (10/07/16 0505)  . vancomycin       LOS: 1 day        Tawni Millers, MD [facility] Pager [phone]

## 2016-10-08 DIAGNOSIS — B3781 Candidal esophagitis: Secondary | ICD-10-CM

## 2016-10-08 DIAGNOSIS — D649 Anemia, unspecified: Secondary | ICD-10-CM

## 2016-10-08 DIAGNOSIS — M069 Rheumatoid arthritis, unspecified: Secondary | ICD-10-CM

## 2016-10-08 LAB — CBC WITH DIFFERENTIAL/PLATELET
BASOS ABS: 0 10*3/uL (ref 0.0–0.1)
Basophils Relative: 0 %
EOS PCT: 0 %
Eosinophils Absolute: 0 10*3/uL (ref 0.0–0.7)
HEMATOCRIT: 26.5 % — AB (ref 36.0–46.0)
Hemoglobin: 7.2 g/dL — ABNORMAL LOW (ref 12.0–15.0)
Lymphocytes Relative: 9 %
Lymphs Abs: 1.1 10*3/uL (ref 0.7–4.0)
MCH: 19.8 pg — ABNORMAL LOW (ref 26.0–34.0)
MCHC: 27.2 g/dL — ABNORMAL LOW (ref 30.0–36.0)
MCV: 73 fL — AB (ref 78.0–100.0)
MONO ABS: 0.4 10*3/uL (ref 0.1–1.0)
MONOS PCT: 3 %
NEUTROS PCT: 88 %
Neutro Abs: 10.2 10*3/uL — ABNORMAL HIGH (ref 1.7–7.7)
PLATELETS: 314 10*3/uL (ref 150–400)
RBC: 3.63 MIL/uL — AB (ref 3.87–5.11)
RDW: 16.6 % — AB (ref 11.5–15.5)
WBC: 11.7 10*3/uL — ABNORMAL HIGH (ref 4.0–10.5)

## 2016-10-08 LAB — BASIC METABOLIC PANEL
ANION GAP: 3 — AB (ref 5–15)
BUN: 5 mg/dL — ABNORMAL LOW (ref 6–20)
CALCIUM: 8.1 mg/dL — AB (ref 8.9–10.3)
CO2: 25 mmol/L (ref 22–32)
Chloride: 112 mmol/L — ABNORMAL HIGH (ref 101–111)
Creatinine, Ser: 0.58 mg/dL (ref 0.44–1.00)
GLUCOSE: 146 mg/dL — AB (ref 65–99)
POTASSIUM: 3.2 mmol/L — AB (ref 3.5–5.1)
Sodium: 140 mmol/L (ref 135–145)

## 2016-10-08 LAB — URINE CULTURE: Culture: <10000 — AB

## 2016-10-08 LAB — CBC
HCT: 29.7 % — ABNORMAL LOW (ref 36.0–46.0)
Hemoglobin: 8.3 g/dL — ABNORMAL LOW (ref 12.0–15.0)
MCH: 20.8 pg — AB (ref 26.0–34.0)
MCHC: 27.9 g/dL — ABNORMAL LOW (ref 30.0–36.0)
MCV: 74.3 fL — AB (ref 78.0–100.0)
PLATELETS: 307 10*3/uL (ref 150–400)
RBC: 4 MIL/uL (ref 3.87–5.11)
RDW: 17.5 % — ABNORMAL HIGH (ref 11.5–15.5)
WBC: 11.8 10*3/uL — ABNORMAL HIGH (ref 4.0–10.5)

## 2016-10-08 LAB — PREPARE RBC (CROSSMATCH)

## 2016-10-08 LAB — HEMOGLOBIN AND HEMATOCRIT, BLOOD
HCT: 26.8 % — ABNORMAL LOW (ref 36.0–46.0)
Hemoglobin: 7.2 g/dL — ABNORMAL LOW (ref 12.0–15.0)

## 2016-10-08 MED ORDER — FLUCONAZOLE 150 MG PO TABS
150.0000 mg | ORAL_TABLET | Freq: Once | ORAL | Status: AC
  Administered 2016-10-08: 150 mg via ORAL
  Filled 2016-10-08: qty 1

## 2016-10-08 MED ORDER — FERROUS SULFATE 325 (65 FE) MG PO TABS
325.0000 mg | ORAL_TABLET | Freq: Two times a day (BID) | ORAL | Status: DC
  Administered 2016-10-08 (×2): 325 mg via ORAL
  Filled 2016-10-08 (×2): qty 1

## 2016-10-08 MED ORDER — FLUCONAZOLE 200 MG PO TABS: 200.0000 mg | ORAL_TABLET | Freq: Every day | ORAL | Status: DC

## 2016-10-08 MED ORDER — PREDNISONE 20 MG PO TABS: 20.0000 mg | ORAL_TABLET | Freq: Every day | ORAL | 0 refills | Status: DC

## 2016-10-08 MED ORDER — SODIUM CHLORIDE 0.9 % IV SOLN
Freq: Once | INTRAVENOUS | Status: AC
  Administered 2016-10-08: 13:00:00 via INTRAVENOUS

## 2016-10-08 MED ORDER — PREDNISONE 5 MG PO TABS: ORAL_TABLET | ORAL | 0 refills | Status: DC

## 2016-10-08 MED ORDER — PREDNISONE 20 MG PO TABS
20.0000 mg | ORAL_TABLET | Freq: Every day | ORAL | Status: DC
  Administered 2016-10-08: 20 mg via ORAL
  Filled 2016-10-08: qty 1

## 2016-10-08 MED ORDER — FLUCONAZOLE 200 MG PO TABS: 200.0000 mg | ORAL_TABLET | Freq: Every day | ORAL | 0 refills | Status: DC

## 2016-10-08 MED ORDER — POTASSIUM CHLORIDE CRYS ER 20 MEQ PO TBCR
40.0000 meq | EXTENDED_RELEASE_TABLET | Freq: Once | ORAL | Status: AC
  Administered 2016-10-08: 40 meq via ORAL
  Filled 2016-10-08: qty 2

## 2016-10-08 MED ORDER — FERROUS SULFATE 325 (65 FE) MG PO TABS: 325.0000 mg | ORAL_TABLET | Freq: Two times a day (BID) | ORAL | 0 refills | Status: DC

## 2016-10-08 NOTE — Progress Notes (Signed)
Patient discharged home with significant other- prescriptions and personal belongings were sent home and IV taken out. Patient was wheeled to exit where she was picked up by significant other.

## 2016-10-08 NOTE — Discharge Instructions (Signed)
Antibiotic Medicine, Adult  Antibiotic medicines treat infections caused by a type of germ called bacteria. They work by killing the bacteria that make you sick.  When do I need to take antibiotics?  You often need these medicines to treat bacterial infections, such as:  · A urinary tract infection (UTI).  · Strep throat.  · Meningitis. This affects the spinal cord and brain.  · A bad lung infection.    You may start the medicines while your doctor waits for tests to come back. When the tests come back, your doctor may change or stop your medicine.  When are antibiotics not needed?  You do not need these medicines for most common illnesses, such as:  · A cold.  · The flu.  · A sore throat.    Antibiotics are not always needed for all infections caused by bacteria. Do not ask for these medicines, or take them, when they are not needed.  What are the risks of taking antibiotics?  Most antibiotics can cause an infection called Clostridium difficile.This causes watery poop (diarrhea). Let your doctor know right away if:  · You have watery poop while taking an antibiotic.  · You have watery poop after you stop taking an antibiotic. The illness can happen weeks after you stop the medicine.    You also have a risk of getting an infection in the future that antibiotics cannot treat (antibiotic-resistant infection). This type of infection can be dangerous.  What else should I know about taking antibiotics?  · You need to take the entire prescription.  ? Take the medicine for as long as told by your doctor.  ? Do not stop taking it even if you start to feel better.  · Try not to miss any doses. If you miss a dose, call your doctor.  · Birth control pills may not work. If you take birth control pills:  ? Keep on taking them.  ? Use a second form of birth control, such as a condom. Do this for as long as told by your doctor.  · Ask your doctor:  ? How long to wait in between doses.  ? If you should take the medicine with  food.  ? If there is anything you should stay away from while taking the antibiotic, such as:  ? Food.  ? Drinks.  ? Medicines.  ? If there are any side effects you should watch for.  · Only take the medicines that your doctor told you to take. Do not take medicines that were given to someone else.  · Drink a large glass of water with the medicine.  · Ask the pharmacist for a tool to measure the medicine, such as:  ? A syringe.  ? A cup.  ? A spoon.  · Throw away any extra medicine.  Contact a doctor if:  · You get worse.  · You have new joint pain or muscle aches after starting the medicine.  · You have side effects from the medicine, such as:  ? Stomach pain.  ? Watery poop.  ? Feeling sick to your stomach (nausea).  Get help right away if:  · You have signs of a very bad allergic reaction. If this happens, stop taking the medicine right away. Signs may include:  ? Hives. These are raised, itchy, red bumps on the skin.  ? Skin rash.  ? Trouble breathing.  ? Wheezing.  ? Swelling.  ? Feeling dizzy.  ? Throwing up (  vomiting).  · Your pee (urine) is dark, or is the color of blood.  · Your skin turns yellow.  · You bruise easily.  · You bleed easily.  · You have very bad watery poop and cramps in your belly.  · You have a very bad headache.  Summary  · Antibiotics are often used to treat infections caused by bacteria.  · Only take these medicines when needed.  · Let your doctor know if you have watery poop while taking an antibiotic.  · You need to take the entire prescription.  This information is not intended to replace advice given to you by your health care provider. Make sure you discuss any questions you have with your health care provider.  Document Released: 10/11/2007 Document Revised: 01/04/2016 Document Reviewed: 01/04/2016  [employer]vier Interactive Patient Education © 2017 [employer]

## 2016-10-08 NOTE — Discharge Summary (Addendum)
Physician Discharge Summary  Amy Hall WUJ:[id number] DOB: 09/18/1965 DOA: 10/06/2016  PCP: [name], MD  Admit date: 10/06/2016 Discharge date: 10/08/2016  Admitted From: home  Disposition:  home  Recommendations for Outpatient Follow-up:  1. Follow up with Rheumatology in 2-4 weeks 2. Continue prednisone 20mg  daily x 2 weeks, then reduce by 5mg /week  3. Continue methotrexate 4. 21 day course of fluconazole 5. F/u with gastroenterology in 1-2 weeks for repeat CBC 6. Resumed PPI 7. PCP to please check A1c if not already complete  Home Health:  none  Equipment/Devices:  None  Discharge Condition:  Stable, improved CODE STATUS:  Full code  Diet recommendation:  regular   Brief/Interim Summary:  51 year old female who presented with oral blisters and mouth swelling. Patient is known to have rheumatoid arthritis, chronic prednisone use, recently started on methotrexate. Reported 7 days of worsening arthritis, along with oral blisters. She presents to the hospital due to worsening symptoms.  She was febrile to 101F in the ER with mild tachycardia and mild hypotension 86/48. Positive thrush on exam.  Urinalysis had 60-30 white cells, negative nitrates. Chest x-ray, with no infiltrates, effusions or pneumothorax.  She was started on broad spectrum antibiotics and admitted to stepdown.  Fevers resolved and have not recurred since the emergency department. Tachycardia has resolved. She is feeling better.  She states in addition to the prednisone and methotrexate that she has been on she was also on amoxicillin last month because of an abscessed tooth. I suspect that she has a vaginal yeast infection as well as oral thrush as a result.  No other obvious source of infection to cause her fever.  Blood cultures are no growth to date and urine culture was negative.  She was given Rx for fluconazole. She has worsening anemia today with hemoglobin of 7.2 g/dL. Iron levels, B12, and folate were all  within normal limits. She has a history of an occult positive stool in March of this year which was attributed to severe GERD based on EGD by Dr. Paulita Fujita.  She is not had any obvious blood in her stools.  She was transfused 1 unit of PRBC and started on oral iron supplementation.  Her ferritin level is 21 and in March, her ferritin was 6.  [facility] will call the patient to schedule close outpatient follow up for her anemia.    Hand pain and swelling are markedly better on increased steroids.  Dr. Annabelle Harman, from [facility], recommended continuing prednisone 20mg  a little longer than previously recommended and following up in a few weeks as an outpatient.     Discharge Diagnoses:  Active Problems:   Rheumatoid arthritis ([place])   Anemia   Acute lower UTI   Thrush   Esophageal thrush (HCC)  Sepsis ruled out -  Blood cultures NGTD -  Urine culture insignificant growth -  Broad spectrum antibiotics were discontinued  Candidal pharyngitis, presumptive esophagitis, and vaginitis -  Fluconazole 200mg  once daily x 21 days  Rheumatoid arthritis, right hand swelling has improved.  Has some thickening of the bilateral wrists and MCP joints, right more than left  -  Okay to resume methotrexate per Dr. Annabelle Harman -  Prednisone 20mg  daily x 2 weeks, then decrease by 5mg  weekly until follow up  -  Continue cyclobenzaprine   Anemia of chronic GI losses and iron deficiency (although ferritin has gone up) with superimposed anemia of acute illness.  History of severe GERD with erosions March 2018.   -  Transfused 1 unit PRBC on 9/24 with appropriate increase in hemoglobin -  Started iron supplementationi -  Continue prilosec pending GI follow up  Hyperglycemia due to steroids -  Check A1c as outpatient  Discharge Instructions  Discharge Instructions    Call MD for:  difficulty breathing, headache or visual disturbances    Complete by:  As directed    Call MD for:  persistant dizziness or light-headedness     Complete by:  As directed    Call MD for:  persistant nausea and vomiting    Complete by:  As directed    Call MD for:  severe uncontrolled pain    Complete by:  As directed    Call MD for:  temperature >100.4    Complete by:  As directed    Diet general    Complete by:  As directed    Discharge instructions    Complete by:  As directed    Please take fluconazole to treat your thrush for 21 days.  For your rheumatoid arthritis, please continue to take your methotrexate.  I spoke with Dr. Annabelle Harman:  Take prednisone 20mg  once daily for 2 weeks, then reduce to 15 mg once daily for 2 weeks, then 10mg  once daily for 2 weeks.  He will see you in clinic in a few weeks.  For your anemia, you received a blood transfusion.  Please continue to take omeprazole and I have given you a prescription for iron.  I have called [facility] and their office will call you to schedule an appointment to follow up for your anemia.   Increase activity slowly    Complete by:  As directed        Medication List    TAKE these medications   albuterol 108 (90 Base) MCG/ACT inhaler Commonly known as:  PROVENTIL HFA;VENTOLIN HFA Inhale 2 puffs into the lungs every 6 (six) hours as needed for wheezing or shortness of breath.   alendronate 70 MG tablet Commonly known as:  FOSAMAX Take 70 mg by mouth once a week.   b complex-C-folic acid 1 MG capsule Take 1 capsule (1 mg total) by mouth daily.   cetirizine 10 MG tablet Commonly known as:  ZYRTEC Take 1 tablet (10 mg total) by mouth daily.   cyclobenzaprine 5 MG tablet Commonly known as:  FLEXERIL Take 1 tablet (5 mg total) by mouth 2 (two) times daily as needed for muscle spasms. What changed:  when to take this   dextromethorphan-guaiFENesin 30-600 MG 12hr tablet Commonly known as:  MUCINEX DM Take 1 tablet by mouth 2 (two) times daily as needed for cough.   ferrous sulfate 325 (65 FE) MG tablet Take 1 tablet (325 mg total) by mouth 2 (two) times  daily with a meal.   fluconazole 200 MG tablet Commonly known as:  DIFLUCAN Take 1 tablet (200 mg total) by mouth daily.   methotrexate 2.5 MG tablet Commonly known as:  RHEUMATREX Take 15 mg by mouth once a week.   omeprazole 40 MG capsule Commonly known as:  PRILOSEC Take 40 mg by mouth daily.   predniSONE 20 MG tablet Commonly known as:  DELTASONE Take 1 tablet (20 mg total) by mouth daily with breakfast. What changed:  medication strength  how much to take  how to take this  when to take this  additional instructions   predniSONE 5 MG tablet Commonly known as:  DELTASONE In 2 weeks, take 3 tabs daily x 2 weeks,  then decrease to 2 tabs daily x 2 weeks, then 1 tab daily thereafter What changed:  You were already taking a medication with the same name, and this prescription was added. Make sure you understand how and when to take each.      Follow-up Information    Luk, Shearon Stalls, MD Follow up.   Specialty:  Rheumatology Contact information: [place] [address] [phone]        Arta Silence, MD. Schedule an appointment as soon as possible for a visit.   Specialty:  Gastroenterology Why:  office will call you to schedule an appt Contact information: [address] [place] [address] [phone]        [name], MD. Schedule an appointment as soon as possible for a visit in 1 week(s).   Specialty:  Family Medicine Contact information: [address] [place] [address] [phone]          Allergies  Allergen Reactions  . Gabapentin     More pain     Consultations: None   Procedures/Studies: Dg Chest 2 View  Result Date: 10/06/2016 CLINICAL DATA:  Multiple complaints. Oral blisters, rheumatoid arthritis, hand swelling. Code sepsis. EXAM: CHEST  2 VIEW COMPARISON:  Chest radiograph August 04, 2016 FINDINGS: Cardiomediastinal silhouette is normal. Small to moderate hiatal  hernia. Similar bronchitic changes without pleural effusion or focal consolidation. No pneumothorax. Old moderate L1 compression fracture. IMPRESSION: Similar bronchitic changes. Small to moderate hiatal hernia. Electronically Signed   By: Elon Alas M.D.   On: 10/06/2016 20:55   Dg Hand Complete Right  Result Date: 10/06/2016 CLINICAL DATA:  Rheumatoid arthritis. More hand swelling than usual. EXAM: RIGHT HAND - COMPLETE 3+ VIEW COMPARISON:  None. FINDINGS: Mild degenerate change of the radiocarpal joint and distal radioulnar joints. Mild degenerate changes over the carpal bones with mild widening of the scapholunate joint space. Persistent flexion over the interphalangeal joints. Mild periarticular osteopenia. No evidence of bony erosions. Mild degenerate change of the first MCP joint. Suggestion of an old fifth metacarpal fracture. No acute fracture or dislocation. IMPRESSION: Degenerative changes as described most prominent over the wrist. No definite erosive change. Electronically Signed   By: Marin Olp M.D.   On: 10/06/2016 21:46   Subjective:  Feeling much better and would like to go home.  Denies SOB, cough, but still having sore throat and dysuria.  + thick white vaginal discharge   Discharge Exam: Vitals:   10/08/16 1248 10/08/16 1530  BP: 117/83 129/82  Pulse: 79 71  Resp: 18 17  Temp: 98.7 F (37.1 C) 98.4 F (36.9 C)  SpO2: 98% 100%   Vitals:   10/08/16 0800 10/08/16 1233 10/08/16 1248 10/08/16 1530  BP: 135/84 117/82 117/83 129/82  Pulse: 85 75 79 71  Resp: (!) 24 18 18 17   Temp:  98.3 F (36.8 C) 98.7 F (37.1 C) 98.4 F (36.9 C)  TempSrc:  Oral Oral Oral  SpO2: 97%  98% 100%  Weight:      Height:        General exam:  Adult female.  No acute distress.  HEENT:  NCAT, white plaques on posterior OP and some punctate like white plaques left on tongue, but I do not see any obvious ulcerations.   Respiratory system: Clear to auscultation  bilaterally Cardiovascular system: Regular rate and rhythm, normal S1/S2. No murmurs, rubs, gallops or clicks.  Warm extremities Gastrointestinal system: Normal active bowel  sounds, soft, nondistended, nontender. MSK:  Swelling of bilateral wrists, right worse than left.  Right MCP joints are swollen.  Right foot toes with large 1st MTP bunion and some overlapping, but no pain with palpation of right or left MTP joints.   Neuro:  Grossly intact   The results of significant diagnostics from this hospitalization (including imaging, microbiology, ancillary and laboratory) are listed below for reference.     Microbiology: Recent Results (from the past 240 hour(s))  Blood culture (routine x 2)     Status: None (Preliminary result)   Collection Time: 10/06/16  8:22 PM  Result Value Ref Range Status   Specimen Description BLOOD RIGHT ANTECUBITAL  Final   Special Requests   Final    BOTTLES DRAWN AEROBIC AND ANAEROBIC Blood Culture adequate volume   Culture NO GROWTH 2 DAYS  Final   Report Status PENDING  Incomplete  Blood culture (routine x 2)     Status: None (Preliminary result)   Collection Time: 10/06/16  8:23 PM  Result Value Ref Range Status   Specimen Description BLOOD RIGHT FOREARM  Final   Special Requests   Final    IN PEDIATRIC BOTTLE Blood Culture results may not be optimal due to an excessive volume of blood received in culture bottles   Culture NO GROWTH 2 DAYS  Final   Report Status PENDING  Incomplete  Urine culture     Status: Abnormal   Collection Time: 10/06/16 10:22 PM  Result Value Ref Range Status   Specimen Description URINE, RANDOM  Final   Special Requests NONE  Final   Culture <10,000 COLONIES/mL INSIGNIFICANT GROWTH (A)  Final   Report Status 10/08/2016 FINAL  Final  MRSA PCR Screening     Status: None   Collection Time: 10/07/16  1:38 AM  Result Value Ref Range Status   MRSA by PCR NEGATIVE NEGATIVE Final    Comment:        The GeneXpert MRSA Assay  (FDA approved for NASAL specimens only), is one component of a comprehensive MRSA colonization surveillance program. It is not intended to diagnose MRSA infection nor to guide or monitor treatment for MRSA infections.      Labs: BNP (last 3 results) No results for input(s): BNP in the last 8760 hours. Basic Metabolic Panel:  Recent Labs Lab 10/06/16 1902 10/07/16 0121 10/08/16 0220  NA 132* 138 140  K 3.6 3.5 3.2*  CL 104 113* 112*  CO2 20* 20* 25  GLUCOSE 156* 180* 146*  BUN <5* <5* 5*  CREATININE 0.65 0.58 0.58  CALCIUM 8.4* 7.4* 8.1*   Liver Function Tests:  Recent Labs Lab 10/06/16 2002  AST 36  ALT 28  ALKPHOS 45  BILITOT 0.5  PROT 5.9*  ALBUMIN 3.0*   No results for input(s): LIPASE, AMYLASE in the last 168 hours. No results for input(s): AMMONIA in the last 168 hours. CBC:  Recent Labs Lab 10/06/16 1902 10/07/16 0121 10/08/16 0220 10/08/16 1200 10/08/16 1704  WBC 13.3* 8.4 11.7*  --  11.8*  NEUTROABS 11.6*  --  10.2*  --   --   HGB 8.8* 7.7* 7.2* 7.2* 8.3*  HCT 32.0* 28.4* 26.5* 26.8* 29.7*  MCV 72.4* 73.2* 73.0*  --  74.3*  PLT 364 309 314  --  307   Cardiac Enzymes: No results for input(s): CKTOTAL, CKMB, CKMBINDEX, TROPONINI in the last 168 hours. BNP: Invalid input(s): POCBNP CBG: No results for input(s): GLUCAP in the last 168 hours.  D-Dimer No results for input(s): DDIMER in the last 72 hours. Hgb A1c No results for input(s): HGBA1C in the last 72 hours. Lipid Profile No results for input(s): CHOL, HDL, LDLCALC, TRIG, CHOLHDL, LDLDIRECT in the last 72 hours. Thyroid function studies No results for input(s): TSH, T4TOTAL, T3FREE, THYROIDAB in the last 72 hours.  Invalid input(s): FREET3 Anemia work up  Recent Labs  10/07/16 0121  VITAMINB12 528  FOLATE 21.7  FERRITIN 21  TIBC 283  IRON 44  RETICCTPCT 1.3   Urinalysis    Component Value Date/Time   COLORURINE STRAW (A) 10/06/2016 2222   APPEARANCEUR HAZY (A)  10/06/2016 2222   LABSPEC 1.002 (L) 10/06/2016 2222   PHURINE 7.0 10/06/2016 2222   GLUCOSEU NEGATIVE 10/06/2016 2222   HGBUR SMALL (A) 10/06/2016 2222   BILIRUBINUR NEGATIVE 10/06/2016 2222   BILIRUBINUR negative 04/03/2016 1446   KETONESUR NEGATIVE 10/06/2016 2222   PROTEINUR NEGATIVE 10/06/2016 2222   UROBILINOGEN 0.2 04/03/2016 1446   NITRITE NEGATIVE 10/06/2016 2222   LEUKOCYTESUR LARGE (A) 10/06/2016 2222   Sepsis Labs Invalid input(s): PROCALCITONIN,  WBC,  LACTICIDVEN   Time coordinating discharge: Over 30 minutes  SIGNED:   Janece Canterbury, MD  [facility] 10/08/2016, 6:02 PM Pager   If 7PM-7AM, please contact night-coverage [email or web address] Password [email or web address]

## 2016-10-09 LAB — TYPE AND SCREEN
ABO/RH(D): B POS
Antibody Screen: NEGATIVE
Unit division: 0

## 2016-10-09 LAB — BPAM RBC
BLOOD PRODUCT EXPIRATION DATE: 201809292359
ISSUE DATE / TIME: 201809241217
UNIT TYPE AND RH: 7300

## 2016-10-09 LAB — ROCKY MTN SPOTTED FVR ABS PNL(IGG+IGM)
RMSF IgG: NEGATIVE
RMSF IgM: 0.78 index (ref 0.00–0.89)

## 2016-10-09 MED FILL — FLUCONAZOLE 200 MG TABLET: 200 | 21 days supply | Qty: 21 | Fill #0

## 2016-10-09 MED FILL — ?PREDNISONE 20 MG TABLET: 20 | 14 days supply | Qty: 14 | Fill #0

## 2016-10-09 MED FILL — FERROUS SULFATE 325 MG TAB: 325 (65 FE) | 30 days supply | Qty: 60 | Fill #0

## 2016-10-09 MED FILL — predniSONE 5 MG TABS: 5 | 72 days supply | Qty: 100 | Fill #0

## 2016-10-11 LAB — CULTURE, BLOOD (ROUTINE X 2)
CULTURE: NO GROWTH
Culture: NO GROWTH
Special Requests: ADEQUATE

## 2016-10-23 MED FILL — FOLIC ACID 1 MG TABLET: 1 | 30 days supply | Qty: 30 | Fill #1

## 2016-10-23 MED FILL — METHOTREXATE 2.5 MG TABLET: 2.5 | 28 days supply | Qty: 24 | Fill #1

## 2016-10-23 MED FILL — ALENDRONATE NA 70 MG TAB: 70 | 28 days supply | Qty: 4 | Fill #1

## 2016-10-23 MED FILL — OMEPRAZOLE DR 40 MG CAPSULE: 40 | 30 days supply | Qty: 30 | Fill #1

## 2016-10-23 MED FILL — CYCLOBENZAPRINE 5 MG TABLET: 5 | 30 days supply | Qty: 60 | Fill #1

## 2016-11-01 ENCOUNTER — Ambulatory Visit: Payer: Self-pay | Admitting: Family Medicine

## 2016-11-12 MED FILL — ?CYCLOBENZAPRINE 5MG TABLET: 5 | 30 days supply | Qty: 60 | Fill #2

## 2016-11-12 MED FILL — METHOTREXATE 2.5 MG TABLET: 2.5 | 28 days supply | Qty: 24 | Fill #2

## 2016-11-12 MED FILL — OMEPRAZOLE DR 40 MG CAPSULE: 40 | 30 days supply | Qty: 30 | Fill #2

## 2016-11-12 MED FILL — FOLIC ACID 1 MG TABLET: 1 | 30 days supply | Qty: 30 | Fill #2

## 2016-11-21 MED FILL — ?PREDNISONE 10 MG TABLET: 10 | 30 days supply | Qty: 30 | Fill #0

## 2016-12-10 MED FILL — ?PREDNISONE 10 MG TABLET: 10 | 30 days supply | Qty: 40 | Fill #0 | Status: TO

## 2016-12-10 MED FILL — FOLIC ACID 1 MG TABLET: 1 | 30 days supply | Qty: 30 | Fill #3

## 2016-12-13 ENCOUNTER — Other Ambulatory Visit: Payer: Self-pay | Admitting: Family Medicine

## 2016-12-13 DIAGNOSIS — M48062 Spinal stenosis, lumbar region with neurogenic claudication: Secondary | ICD-10-CM

## 2016-12-13 MED FILL — METHOTREXATE 2.5 MG TABLET: 2.5 | 28 days supply | Qty: 24 | Fill #3

## 2016-12-17 ENCOUNTER — Ambulatory Visit: Payer: Self-pay | Attending: Family Medicine | Admitting: Family Medicine

## 2016-12-17 ENCOUNTER — Encounter: Payer: Self-pay | Admitting: Family Medicine

## 2016-12-17 VITALS — BP 117/80 | HR 85 | Temp 97.8°F | Ht 62.0 in | Wt 134.8 lb

## 2016-12-17 DIAGNOSIS — M5136 Other intervertebral disc degeneration, lumbar region: Secondary | ICD-10-CM | POA: Insufficient documentation

## 2016-12-17 DIAGNOSIS — Z131 Encounter for screening for diabetes mellitus: Secondary | ICD-10-CM

## 2016-12-17 DIAGNOSIS — Z888 Allergy status to other drugs, medicaments and biological substances status: Secondary | ICD-10-CM | POA: Insufficient documentation

## 2016-12-17 DIAGNOSIS — Z7952 Long term (current) use of systemic steroids: Secondary | ICD-10-CM | POA: Insufficient documentation

## 2016-12-17 DIAGNOSIS — H538 Other visual disturbances: Secondary | ICD-10-CM | POA: Insufficient documentation

## 2016-12-17 DIAGNOSIS — Z1211 Encounter for screening for malignant neoplasm of colon: Secondary | ICD-10-CM | POA: Insufficient documentation

## 2016-12-17 DIAGNOSIS — M069 Rheumatoid arthritis, unspecified: Secondary | ICD-10-CM | POA: Insufficient documentation

## 2016-12-17 DIAGNOSIS — M48062 Spinal stenosis, lumbar region with neurogenic claudication: Secondary | ICD-10-CM | POA: Insufficient documentation

## 2016-12-17 DIAGNOSIS — J45909 Unspecified asthma, uncomplicated: Secondary | ICD-10-CM | POA: Insufficient documentation

## 2016-12-17 DIAGNOSIS — Z9889 Other specified postprocedural states: Secondary | ICD-10-CM | POA: Insufficient documentation

## 2016-12-17 DIAGNOSIS — Z79899 Other long term (current) drug therapy: Secondary | ICD-10-CM | POA: Insufficient documentation

## 2016-12-17 DIAGNOSIS — Z0000 Encounter for general adult medical examination without abnormal findings: Secondary | ICD-10-CM

## 2016-12-17 MED ORDER — CYCLOBENZAPRINE HCL 5 MG PO TABS: 5.0000 mg | ORAL_TABLET | Freq: Two times a day (BID) | ORAL | 0 refills | Status: DC

## 2016-12-17 MED FILL — ?CYCLOBENZAPRINE 5MG TABLET: 5 | 30 days supply | Qty: 60 | Fill #0

## 2016-12-17 NOTE — Patient Instructions (Signed)
Rheumatoid Arthritis Rheumatoid arthritis (RA) is a long-term (chronic) disease. RA causes inflammation in your joints. Your joints may feel painful, stiff, swollen, warm, or tender. RA may start slowly. Usually, it affects the small joints of the hands and feet. It can also affect other parts of the body, even the heart, eyes, or lungs. Symptoms of RA often come and go. Sometimes, symptoms get worse for a while. These are called flares. There is no cure for RA, but your doctor will work with you to find the best treatment option for you. This will depend on how the disease is changing in your body. Follow these instructions at home:  Take over-the-counter and prescription medicines only as told by your doctor. Your doctor may change (adjust) your medicines every 3 months.  Start an exercise program as told by your doctor.  Rest when you have a flare.  Return to your normal activities as told by your doctor. Ask your doctor what activities are safe for you.  Keep all follow-up visits as told by your doctor. This is important. Contact a doctor if:  You have a flare.  You have a fever.  You have problems (side effects) because of your medicines. Get help right away if:  You have chest pain.  You have trouble breathing.  You have a hot, painful joint all of a sudden, and it is worse than your usual joint aches. This information is not intended to replace advice given to you by your health care provider. Make sure you discuss any questions you have with your health care provider. Document Released: 03/26/2011 Document Revised: 06/09/2015 Document Reviewed: 10/14/2014 Elsevier Interactive Patient Education  2018 Elsevier Inc.  

## 2016-12-17 NOTE — Progress Notes (Signed)
Subjective:  Patient ID: Amy Hall, female    DOB: [birth year]  Age: 51 y.o. MRN: [id number]  CC: Blurred Vision   HPI Ercilia Bettinger is a 51 year old female with a history of asthma, rheumatoid arthritis, spinal stenosis who  presents today for a follow-up visit.  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 She has been taking her methotrexate and prednisone but complains of stiffness in her fingers and feet with associated pain ever since she commenced a decreased dose of prednisone ordered by her rheumatologist which led to her inability to walk during her rehab stay and she had to be discharged.  She complains of blurry vision which is described as a film covering both eyes.  [facility] presented to [facility] ED where she was prescribed an eye ointment which has not brought about improvement in her symptoms. Last A1c was 5.7 last year. Her prednisone dose was also increased at her ED visit by her rheumatologist given ongoing polyarthralgias.  Past Medical History:  Diagnosis Date  . Arthritis   . Asthma   . DDD (degenerative disc disease), lumbar   . Osteoporosis of lumbar spine   . Rheumatoid arthritis ([place])   . Sleep disorder     Past Surgical History:  Procedure Laterality Date  . ELBOW SURGERY    . ESOPHAGOGASTRODUODENOSCOPY (EGD) WITH PROPOFOL Left 04/05/2016   Procedure: ESOPHAGOGASTRODUODENOSCOPY (EGD) WITH PROPOFOL;  Surgeon: Arta Silence, MD;  Location: [facility] ENDOSCOPY;  Service: Endoscopy;  Laterality: Left;    Allergies  Allergen Reactions  . Gabapentin     More pain      Outpatient Medications Prior to Visit  Medication Sig Dispense Refill  . albuterol (PROVENTIL HFA;VENTOLIN HFA) 108 (90 Base) MCG/ACT inhaler Inhale 2 puffs into the lungs every 6 (six) hours as needed for wheezing or shortness of breath. 54 g  3  . b complex-C-folic acid 1 MG capsule Take 1 capsule (1 mg total) by mouth daily. 30 capsule 0  . ferrous sulfate 325 (65 FE) MG tablet Take 1 tablet (325 mg total) by mouth 2 (two) times daily with a meal. 60 tablet 0  . predniSONE (DELTASONE) 20 MG tablet Take 1 tablet (20 mg total) by mouth daily with breakfast. 14 tablet 0  . cyclobenzaprine (FLEXERIL) 5 MG tablet Take 1 tablet (5 mg total) by mouth 2 (two) times daily as needed for muscle spasms. (Patient taking differently: Take 5 mg by mouth 2 (two) times daily. ) 60 tablet 2  . alendronate (FOSAMAX) 70 MG tablet Take 70 mg by mouth once a week.  1  . cetirizine (ZYRTEC) 10 MG tablet Take 1 tablet (10 mg total) by mouth daily. (Patient not taking: Reported on 12/17/2016) 30 tablet 2  . dextromethorphan-guaiFENesin (MUCINEX DM) 30-600 MG 12hr tablet Take 1 tablet by mouth 2 (two) times daily as needed for cough. (Patient not taking: Reported on 12/17/2016) 20 tablet 0  . fluconazole (DIFLUCAN) 200 MG tablet Take 1 tablet (200 mg total) by mouth daily. (Patient not taking: Reported on 12/17/2016) 21 tablet 0  . methotrexate (RHEUMATREX) 2.5 MG tablet Take 15 mg by mouth once a week.  1  . omeprazole (PRILOSEC) 40 MG capsule Take 40 mg by mouth daily.  1  . predniSONE (DELTASONE) 5 MG tablet In 2 weeks, take 3 tabs daily x 2 weeks, then decrease to  2 tabs daily x 2 weeks, then 1 tab daily thereafter (Patient not taking: Reported on 12/17/2016) 100 tablet 0   No facility-administered medications prior to visit.     ROS Review of Systems General: negative for fever, weight loss, appetite change Eyes: + visual symptoms. ENT: no ear symptoms, no sinus tenderness, no nasal congestion or sore throat. Neck: no pain  Respiratory: no wheezing, shortness of breath, cough Cardiovascular: no chest pain, no dyspnea on exertion, no pedal edema, no orthopnea. Gastrointestinal: no abdominal pain, no diarrhea, no constipation Genito-Urinary: no urinary  frequency, no dysuria, no polyuria. Hematologic: no bruising Endocrine: no cold or heat intolerance Neurological: no headaches, no seizures, no tremors Musculoskeletal: see hpi Skin: no pruritus, no rash. Psychological: no depression, no anxiety,    Objective:  BP 117/80   Pulse 85   Temp 97.8 F (36.6 C) (Oral)   Ht 5\' 2"  (1.575 m)   Wt 134 lb 12.8 oz (61.1 kg)   LMP 01/16/2015   SpO2 97%   BMI 24.66 kg/m   BP/Weight 12/17/2016 10/08/2016 0/08/6759  Systolic BP 950 932 -  Diastolic BP 80 82 -  Wt. (Lbs) 134.8 - 141.09  BMI 24.66 - 25.81      Physical Exam  Constitutional: normal appearing,  Eyes: PERRLA HEENT: Head is atraumatic, normal sinuses, normal oropharynx, normal appearing tonsils and palate, tympanic membrane is normal bilaterally. Neck: normal range of motion, no thyromegaly, no JVD Cardiovascular: normal rate and rhythm, normal heart sounds, no murmurs, rub or gallop, no pedal edema Respiratory: clear to auscultation bilaterally, no wheezes, no rales, no rhonchi Abdomen: soft, not tender to palpation, normal bowel sounds, no enlarged organs Extremities: Rheumatoid deformities in fingers Skin: warm and dry, no lesions. Neurological: alert, oriented x3, cranial nerves I-XII grossly intact , normal motor strength, normal sensation. Psychological: normal mood.    Assessment & Plan:   1. Spinal stenosis of lumbar region with neurogenic claudication Stable - cyclobenzaprine (FLEXERIL) 5 MG tablet; Take 1 tablet (5 mg total) by mouth 2 (two) times daily.  Dispense: 60 tablet; Refill: 0  2. Screening for diabetes mellitus We will screen for diabetes mellitus given prolonged history of steroid use - Hemoglobin A1c  3. Rheumatoid arthritis, involving unspecified site, unspecified rheumatoid factor presence (HCC) Currently on methotrexate and prednisone Followed by rheumatology  4. Blurred vision, bilateral Completed course of antibiotic ointment Will need  to exclude underlying uveitis She will need to be seen by ophthalmology but she has no medical coverage for this referral Patient to discuss this with her rheumatologist as well given her autoimmune condition  5. Health care maintenance Referred for mammogram and colonoscopy at her last visit   Meds ordered this encounter  Medications  . cyclobenzaprine (FLEXERIL) 5 MG tablet    Sig: Take 1 tablet (5 mg total) by mouth 2 (two) times daily.    Dispense:  60 tablet    Refill:  0    Follow-up: Return in about 3 months (around 03/17/2017) for follow up of  Chronic medical conditions.   [name] MD

## 2016-12-18 LAB — HEMOGLOBIN A1C
Est. average glucose Bld gHb Est-mCnc: 117 mg/dL
HEMOGLOBIN A1C: 5.7 % — AB (ref 4.8–5.6)

## 2016-12-19 ENCOUNTER — Telehealth: Payer: Self-pay

## 2016-12-19 NOTE — Telephone Encounter (Signed)
Pt was called and informed of lab results, pt states that she would like a medication for her allergies.

## 2016-12-27 ENCOUNTER — Ambulatory Visit: Payer: Medicaid Other

## 2017-01-09 MED FILL — FOLIC ACID 1 MG TABLET: 1 | 30 days supply | Qty: 30 | Fill #4

## 2017-01-09 MED FILL — ?CYCLOBENZAPRINE 5 MG TABLE: 5 | 30 days supply | Qty: 60 | Fill #0

## 2017-01-09 MED FILL — METHOTREXATE 2.5 MG TABLET: 2.5 | 28 days supply | Qty: 24 | Fill #4

## 2017-01-22 ENCOUNTER — Ambulatory Visit: Payer: Medicaid Other | Attending: Family Medicine

## 2017-01-24 ENCOUNTER — Telehealth: Payer: Self-pay | Admitting: Licensed Clinical Social Worker

## 2017-01-24 NOTE — Telephone Encounter (Signed)
Pt was informed of her Financial Counseling and Pharmacy paperwork that was in [facility].   Pt declined offer to have paperwork mailed and advised [employer] to shred it. No additional concerns noted.

## 2017-01-31 MED FILL — ?FOLIC ACID 1 MG TABLET: 1 | 30 days supply | Qty: 30 | Fill #0

## 2017-01-31 MED FILL — predniSONE 10 MG TABS: 10 | 28 days supply | Qty: 53 | Fill #0

## 2017-01-31 MED FILL — ?ALENDRONATE NA 70MG TA: 70 | 90 days supply | Qty: 12 | Fill #0

## 2017-01-31 MED FILL — ?METHOTREXATE 2.5 MG TABLET: 2.5 | 49 days supply | Qty: 56 | Fill #0

## 2017-02-11 ENCOUNTER — Other Ambulatory Visit: Payer: Self-pay | Admitting: Family Medicine

## 2017-02-11 DIAGNOSIS — M48062 Spinal stenosis, lumbar region with neurogenic claudication: Secondary | ICD-10-CM

## 2017-02-12 MED FILL — ?CYCLOBENZAPRINE 5 MG TABLE: 5 | 15 days supply | Qty: 30 | Fill #0

## 2017-03-01 MED FILL — predniSONE 10 MG TABS: 10 | 30 days supply | Qty: 30 | Fill #1

## 2017-03-01 MED FILL — ?CYCLOBENZAPRINE 5 MG TABLE: 5 | 15 days supply | Qty: 30 | Fill #1

## 2017-03-01 MED FILL — ?FOLIC ACID 1 MG TABLET: 1 | 30 days supply | Qty: 30 | Fill #1

## 2017-03-18 MED FILL — ?CYCLOBENZAPRINE 5MG TABLET: 5 | 15 days supply | Qty: 30 | Fill #2

## 2017-03-18 MED FILL — ?FOLIC ACID 1 MG TABLET: 1 | 30 days supply | Qty: 30 | Fill #2

## 2017-03-18 MED FILL — predniSONE 10 MG TABS: 10 | 27 days supply | Qty: 27 | Fill #2

## 2017-04-04 MED FILL — predniSONE 10 MG TABS: 10 | 30 days supply | Qty: 30 | Fill #0

## 2017-04-04 MED FILL — ?METHOTREXATE 2.5 MG TABLET: 2.5 | 28 days supply | Qty: 32 | Fill #0

## 2017-04-13 DIAGNOSIS — F17200 Nicotine dependence, unspecified, uncomplicated: Secondary | ICD-10-CM | POA: Insufficient documentation

## 2017-04-15 MED FILL — ?CYCLOBENZAPRINE 5MG TABLET: 5 | 30 days supply | Qty: 60 | Fill #0

## 2017-04-15 MED FILL — ALBUTEROL SULFATE HFA 108 (: 108 (90 BAS | 25 days supply | Qty: 18 | Fill #0

## 2017-04-15 MED FILL — ?FOLIC ACID 1 MG TABLET: 1 | 30 days supply | Qty: 30 | Fill #3

## 2017-04-22 ENCOUNTER — Telehealth: Payer: Self-pay

## 2017-04-22 NOTE — Telephone Encounter (Signed)
Yvette from [facility] called and wanted to make PCP and Alinda Sierras aware that pt has an outstanding balance and she will need to pay this before being seen and pt is aware. Dewaine Oats just wanted to make an FYI on to why pt hasn't been seen

## 2017-04-22 NOTE — Telephone Encounter (Signed)
Thank You.

## 2017-04-30 MED FILL — ALENDRONATE NA 70 MG TAB: 70 | 28 days supply | Qty: 4 | Fill #1

## 2017-04-30 MED FILL — ?METHOTREXATE 2.5 MG TABLET: 2.5 | 28 days supply | Qty: 32 | Fill #1

## 2017-04-30 MED FILL — ?CYCLOBENZAPRINE 5MG TABLET: 5 | 15 days supply | Qty: 30 | Fill #0

## 2017-04-30 MED FILL — predniSONE 10 MG TABS: 10 | 32 days supply | Qty: 60 | Fill #0

## 2017-05-09 MED FILL — ?FOLIC ACID 1 MG TABLET: 1 | 30 days supply | Qty: 30 | Fill #0

## 2017-05-09 MED FILL — predniSONE 10 MG TABS: 10 | 30 days supply | Qty: 65 | Fill #0

## 2017-05-28 MED FILL — ?CYCLOBENZAPRINE 5MG TABLET: 5 | 15 days supply | Qty: 30 | Fill #1

## 2017-06-03 MED FILL — ?FOLIC ACID 1 MG TABLET: 1 | 30 days supply | Qty: 30 | Fill #0

## 2017-06-03 MED FILL — predniSONE 10 MG TABS: 10 | 30 days supply | Qty: 65 | Fill #0

## 2017-06-03 MED FILL — ?METHOTREXATE 2.5 MG TABLET: 2.5 | 28 days supply | Qty: 32 | Fill #0

## 2017-06-03 MED FILL — ALENDRONATE NA 70 MG TAB: 70 | 28 days supply | Qty: 4 | Fill #0

## 2017-06-14 MED FILL — OMEPRAZOLE DR 40 MG CAPSULE: 40 | 30 days supply | Qty: 30 | Fill #3

## 2017-06-14 MED FILL — ?CYCLOBENZAPRINE 5MG TABLET: 5 | 15 days supply | Qty: 30 | Fill #2

## 2017-07-09 MED FILL — ALENDRONATE NA 70 MG TAB: 70 | 28 days supply | Qty: 4 | Fill #1

## 2017-07-09 MED FILL — ?METHOTREXATE 2.5 MG TABLET: 2.5 | 28 days supply | Qty: 32 | Fill #1

## 2017-07-09 MED FILL — predniSONE 10 MG TABS: 10 | 50 days supply | Qty: 60 | Fill #0

## 2017-07-09 MED FILL — FOLIC ACID 1 MG TABS: 1 | 30 days supply | Qty: 30 | Fill #1

## 2017-07-16 MED FILL — ?CYCLOBENZAPRINE 5MG TABLET: 5 | 30 days supply | Qty: 60 | Fill #0

## 2017-07-29 ENCOUNTER — Ambulatory Visit: Payer: Medicaid Other

## 2017-08-06 ENCOUNTER — Ambulatory Visit: Payer: Medicaid Other | Attending: Family Medicine

## 2017-08-06 MED FILL — FERROUS SULFATE 325 MG TAB: 325 (65 FE) | 30 days supply | Qty: 60 | Fill #0

## 2017-08-06 MED FILL — FOLIC ACID 1 MG TABS: 1 | 30 days supply | Qty: 30 | Fill #2

## 2017-08-06 MED FILL — PANTOPRAZOLE SOD DR 40 MG T: 40 | 30 days supply | Qty: 60 | Fill #0

## 2017-08-06 MED FILL — METHOTREXATE SODIUM 2.5 MG: 2.5 | 28 days supply | Qty: 32 | Fill #2

## 2017-08-27 MED FILL — ?CYCLOBENZAPRINE 5MG TABLET: 5 | 30 days supply | Qty: 60 | Fill #1

## 2017-08-27 MED FILL — ALENDRONATE NA 70 MG TAB: 70 | 28 days supply | Qty: 4 | Fill #2

## 2017-08-27 MED FILL — CLINDAMYCIN HCL 150 MG CAPS: 150 | 10 days supply | Qty: 80 | Fill #0

## 2017-08-27 MED FILL — predniSONE 20 MG TABS: 20 | 5 days supply | Qty: 10 | Fill #0

## 2017-09-09 MED FILL — FERROUS SULFATE 325 MG TAB: 325 (65 FE) | 30 days supply | Qty: 60 | Fill #1

## 2017-09-09 MED FILL — METHOTREXATE SODIUM 2.5 MG: 2.5 | 28 days supply | Qty: 32 | Fill #3

## 2017-09-09 MED FILL — ?PANTOPRAZOLE SO DR 40MG TA: 40 | 30 days supply | Qty: 60 | Fill #1

## 2017-09-09 MED FILL — FOLIC ACID 1 MG TABS: 1 | 30 days supply | Qty: 30 | Fill #3

## 2017-09-23 MED FILL — ALENDRONATE NA 70 MG TAB: 70 | 28 days supply | Qty: 4 | Fill #3

## 2017-09-23 MED FILL — ?CYCLOBENZAPRINE 5MG TABLET: 5 | 30 days supply | Qty: 60 | Fill #2

## 2017-09-25 ENCOUNTER — Encounter: Payer: Self-pay | Admitting: Family Medicine

## 2017-09-25 ENCOUNTER — Ambulatory Visit: Payer: Self-pay | Attending: Family Medicine | Admitting: Family Medicine

## 2017-09-25 ENCOUNTER — Encounter

## 2017-09-25 VITALS — BP 116/81 | HR 87 | Temp 98.1°F | Ht 62.0 in | Wt 144.4 lb

## 2017-09-25 DIAGNOSIS — M5136 Other intervertebral disc degeneration, lumbar region: Secondary | ICD-10-CM | POA: Insufficient documentation

## 2017-09-25 DIAGNOSIS — Z7952 Long term (current) use of systemic steroids: Secondary | ICD-10-CM | POA: Insufficient documentation

## 2017-09-25 DIAGNOSIS — Z131 Encounter for screening for diabetes mellitus: Secondary | ICD-10-CM

## 2017-09-25 DIAGNOSIS — R1900 Intra-abdominal and pelvic swelling, mass and lump, unspecified site: Secondary | ICD-10-CM | POA: Insufficient documentation

## 2017-09-25 DIAGNOSIS — M549 Dorsalgia, unspecified: Secondary | ICD-10-CM | POA: Insufficient documentation

## 2017-09-25 DIAGNOSIS — T380X5A Adverse effect of glucocorticoids and synthetic analogues, initial encounter: Secondary | ICD-10-CM | POA: Insufficient documentation

## 2017-09-25 DIAGNOSIS — Z09 Encounter for follow-up examination after completed treatment for conditions other than malignant neoplasm: Secondary | ICD-10-CM | POA: Insufficient documentation

## 2017-09-25 DIAGNOSIS — J45909 Unspecified asthma, uncomplicated: Secondary | ICD-10-CM | POA: Insufficient documentation

## 2017-09-25 DIAGNOSIS — R300 Dysuria: Secondary | ICD-10-CM | POA: Insufficient documentation

## 2017-09-25 DIAGNOSIS — Z1231 Encounter for screening mammogram for malignant neoplasm of breast: Secondary | ICD-10-CM

## 2017-09-25 DIAGNOSIS — R21 Rash and other nonspecific skin eruption: Secondary | ICD-10-CM | POA: Insufficient documentation

## 2017-09-25 DIAGNOSIS — Z79899 Other long term (current) drug therapy: Secondary | ICD-10-CM | POA: Insufficient documentation

## 2017-09-25 DIAGNOSIS — Z888 Allergy status to other drugs, medicaments and biological substances status: Secondary | ICD-10-CM | POA: Insufficient documentation

## 2017-09-25 DIAGNOSIS — M810 Age-related osteoporosis without current pathological fracture: Secondary | ICD-10-CM | POA: Insufficient documentation

## 2017-09-25 DIAGNOSIS — M069 Rheumatoid arthritis, unspecified: Secondary | ICD-10-CM | POA: Insufficient documentation

## 2017-09-25 DIAGNOSIS — B358 Other dermatophytoses: Secondary | ICD-10-CM

## 2017-09-25 DIAGNOSIS — R6889 Other general symptoms and signs: Secondary | ICD-10-CM

## 2017-09-25 DIAGNOSIS — Z1239 Encounter for other screening for malignant neoplasm of breast: Secondary | ICD-10-CM

## 2017-09-25 LAB — POCT URINALYSIS DIP (CLINITEK)
BILIRUBIN UA: NEGATIVE mg/dL
Bilirubin, UA: NEGATIVE
Blood, UA: NEGATIVE
Glucose, UA: NEGATIVE mg/dL
LEUKOCYTES UA: NEGATIVE
Nitrite, UA: NEGATIVE
PROTEIN: NEGATIVE
Spec Grav, UA: 1.015 (ref 1.010–1.025)
Urobilinogen, UA: 0.2 E.U./dL
pH, UA: 7 (ref 5.0–8.0)

## 2017-09-25 LAB — POCT GLYCOSYLATED HEMOGLOBIN (HGB A1C): HBA1C, POC (CONTROLLED DIABETIC RANGE): 5.3 % (ref 0.0–7.0)

## 2017-09-25 MED ORDER — HYDROCORTISONE 0.5 % EX OINT: 1.0000 "application " | TOPICAL_OINTMENT | Freq: Two times a day (BID) | CUTANEOUS | 1 refills | Status: AC

## 2017-09-25 MED FILL — predniSONE 10 MG TABS: 10 | 79 days supply | Qty: 90 | Fill #0

## 2017-09-25 NOTE — Progress Notes (Signed)
Subjective:  Patient ID: Alexas Basulto, female    DOB: [birth year]  Age: 52 y.o. MRN: [id number]  CC: Back Pain   HPI Ailene Royal is a 52 year old female with a history of asthma, rheumatoid arthritis, spinal stenosis who  presents today for a follow-up visit. She complains of a 3-month history of enlarging rash and posterior aspect of her right leg which has been slightly pruritic.  She also has a similar rash on her left which is not as pronounced.  Denies the presence of rash in other body parts. She is concerned that her face is swelling and has also noticed upper part of her abdomen swelling too.  Denies introduction of new foods or creams. She has also had dysuria for the last 3 days with associated lower abdominal pain but no nausea, vomiting, fever or flank pain. Her rheumatoid arthritis is followed by [facility] rheumatology and she remains on methotrexate, prednisone and adalimumab.  Past Medical History:  Diagnosis Date  . Arthritis   . Asthma   . DDD (degenerative disc disease), lumbar   . Osteoporosis of lumbar spine   . Rheumatoid arthritis ([place])   . Sleep disorder     Past Surgical History:  Procedure Laterality Date  . ELBOW SURGERY    . ESOPHAGOGASTRODUODENOSCOPY (EGD) WITH PROPOFOL Left 04/05/2016   Procedure: ESOPHAGOGASTRODUODENOSCOPY (EGD) WITH PROPOFOL;  Surgeon: Arta Silence, MD;  Location: [facility] ENDOSCOPY;  Service: Endoscopy;  Laterality: Left;    Allergies  Allergen Reactions  . Gabapentin     More pain      Outpatient Medications Prior to Visit  Medication Sig Dispense Refill  . Adalimumab 40 MG/0.4ML PNKT Inject into the skin.    Marland Kitchen albuterol (PROVENTIL HFA;VENTOLIN HFA) 108 (90 Base) MCG/ACT inhaler Inhale 2 puffs into the lungs every 6 (six) hours as needed for wheezing or shortness of breath. 54 g 3  . alendronate (FOSAMAX) 70 MG tablet Take 70 mg by mouth once a week.  1  . b complex-C-folic acid 1 MG capsule Take 1  capsule (1 mg total) by mouth daily. 30 capsule 0  . cyclobenzaprine (FLEXERIL) 5 MG tablet TAKE 1 TABLET BY MOUTH TWICE DAILY AS NEEDED FOR MUSCLE SPASMS 60 tablet 0  . ferrous sulfate 325 (65 FE) MG tablet Take 1 tablet (325 mg total) by mouth 2 (two) times daily with a meal. 60 tablet 0  . methotrexate (RHEUMATREX) 2.5 MG tablet Take 15 mg by mouth once a week.  1  . predniSONE (DELTASONE) 20 MG tablet Take 1 tablet (20 mg total) by mouth daily with breakfast. 14 tablet 0  . cetirizine (ZYRTEC) 10 MG tablet Take 1 tablet (10 mg total) by mouth daily. (Patient not taking: Reported on 12/17/2016) 30 tablet 2  . dextromethorphan-guaiFENesin (MUCINEX DM) 30-600 MG 12hr tablet Take 1 tablet by mouth 2 (two) times daily as needed for cough. (Patient not taking: Reported on 12/17/2016) 20 tablet 0  . fluconazole (DIFLUCAN) 200 MG tablet Take 1 tablet (200 mg total) by mouth daily. (Patient not taking: Reported on 12/17/2016) 21 tablet 0  . omeprazole (PRILOSEC) 40 MG capsule Take 40 mg by mouth daily.  1  . predniSONE (DELTASONE) 5 MG tablet In 2 weeks, take 3 tabs daily x 2 weeks, then decrease to 2 tabs daily x 2 weeks, then 1 tab daily thereafter (Patient not taking: Reported on 12/17/2016) 100 tablet 0   No facility-administered medications prior to visit.     ROS  Review of Systems  Constitutional: Negative for activity change, appetite change and fatigue.  HENT: Negative for congestion, sinus pressure and sore throat.   Eyes: Negative for visual disturbance.  Respiratory: Negative for cough, chest tightness, shortness of breath and wheezing.   Cardiovascular: Negative for chest pain and palpitations.  Gastrointestinal: Negative for abdominal distention, abdominal pain and constipation.  Endocrine: Negative for polydipsia.  Genitourinary: Positive for dysuria. Negative for frequency.  Musculoskeletal: Negative for arthralgias and back pain.  Skin: Positive for rash.  Neurological: Negative for  tremors, light-headedness and numbness.  Hematological: Does not bruise/bleed easily.  Psychiatric/Behavioral: Negative for agitation and behavioral problems.    Objective:  BP 116/81   Pulse 87   Temp 98.1 F (36.7 C) (Oral)   Ht 5\' 2"  (1.575 m)   Wt 144 lb 6.4 oz (65.5 kg)   LMP 01/16/2015   SpO2 97%   BMI 26.41 kg/m   BP/Weight 09/25/2017 12/17/2016 8/37/2902  Systolic BP 111 552 080  Diastolic BP 81 80 82  Wt. (Lbs) 144.4 134.8 -  BMI 26.41 24.66 -      Physical Exam  Constitutional: She is oriented to person, place, and time. She appears well-developed and well-nourished.  Cushingoid facies  Cardiovascular: Normal rate, normal heart sounds and intact distal pulses.  No murmur heard. Pulmonary/Chest: Effort normal and breath sounds normal. She has no wheezes. She has no rales. She exhibits no tenderness.  Abdominal: Soft. Bowel sounds are normal. She exhibits no distension and no mass. There is no tenderness.  Musculoskeletal: Normal range of motion.  No CVA tenderness bilaterally  Neurological: She is alert and oriented to person, place, and time.  Skin:  Wide erythematous well-circumscribed rash prominent on the right calf; minimal on left calf with distinct erythematous border and pale anterior.  Psychiatric: She has a normal mood and affect.    Lab Results  Component Value Date   HGBA1C 5.3 09/25/2017    Assessment & Plan:   1. 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 Screening for breast cancer - MM Digital Screening; Future  4. 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  Rheumatoid  arthritis Continue methotrexate, prednisone, adalimumab Followed by [facility] Bone density at next visit due to prolonged steroid use  Meds ordered this encounter  Medications  . hydrocortisone ointment 0.5 %    Sig: Apply 1 application topically 2 (two) times daily.    Dispense:  30 g    Refill:  1    Follow-up: Return in about 3 months (around 12/25/2017) for Follow-up on chronic medical conditions.   Charlott Rakes MD

## 2017-09-25 NOTE — Progress Notes (Signed)
Patient states that her face and body is swelling.  Patient is having burning while urination.

## 2017-09-26 LAB — URINE CYTOLOGY ANCILLARY ONLY
CHLAMYDIA, DNA PROBE: NEGATIVE
Neisseria Gonorrhea: NEGATIVE
Trichomonas: NEGATIVE

## 2017-09-27 ENCOUNTER — Telehealth: Payer: Self-pay

## 2017-09-27 ENCOUNTER — Encounter: Payer: Self-pay | Admitting: Family Medicine

## 2017-09-27 LAB — URINE CULTURE: ORGANISM ID, BACTERIA: NO GROWTH

## 2017-09-27 LAB — URINE CYTOLOGY ANCILLARY ONLY
BACTERIAL VAGINITIS: NEGATIVE
Candida vaginitis: NEGATIVE

## 2017-09-27 NOTE — Telephone Encounter (Signed)
Patient was called to go over lab results and call was disconnected.

## 2017-09-27 NOTE — Telephone Encounter (Signed)
-----   Message from Charlott Rakes, MD sent at 09/27/2017 12:21 PM EDT ----- Urine culture is negative, STD screen is negative.  Please encouraged to use cranberry juice due to urinary symptoms and increase fluid intake.

## 2017-09-30 ENCOUNTER — Telehealth: Payer: Self-pay

## 2017-09-30 NOTE — Telephone Encounter (Signed)
-----   Message from Charlott Rakes, MD sent at 09/30/2017 10:24 AM EDT ----- Urine test is negative for STDs

## 2017-09-30 NOTE — Telephone Encounter (Signed)
Patient was called and voicemail is currently full and can not accept any new messages at this time.    If patient returns phone call please inform patient of all negative lab results and to increase fluid intake for urinary symptoms.

## 2017-10-21 MED FILL — ALENDRONATE NA 70 MG TAB: 70 | 28 days supply | Qty: 4 | Fill #4

## 2017-10-21 MED FILL — METHOCARBAMOL 500 MG TABS: 500 | 3 days supply | Qty: 30 | Fill #0

## 2017-10-21 MED FILL — ?PANTOPRAZOLE SO DR 40MG TA: 40 | 30 days supply | Qty: 60 | Fill #2

## 2017-10-21 MED FILL — predniSONE 10 MG TABS: 10 | 7 days supply | Qty: 21 | Fill #0

## 2017-10-21 MED FILL — METHOTREXATE SODIUM 2.5 MG: 2.5 | 28 days supply | Qty: 32 | Fill #4

## 2017-10-21 MED FILL — FOLIC ACID 1 MG TABS: 1 | 30 days supply | Qty: 30 | Fill #4

## 2017-10-21 MED FILL — PENICILLIN VK 500 MG TABLET: 500 | 10 days supply | Qty: 40 | Fill #0

## 2017-11-07 MED FILL — ALENDRONATE NA 70 MG TAB: 70 | 28 days supply | Qty: 4 | Fill #0

## 2017-11-07 MED FILL — FOLIC ACID 1 MG TABS: 1 | 30 days supply | Qty: 30 | Fill #0

## 2017-11-07 MED FILL — METHOTREXATE SODIUM 2.5 MG: 2.5 | 28 days supply | Qty: 32 | Fill #0

## 2017-11-08 MED FILL — predniSONE 10 MG TABS: 10 | 28 days supply | Qty: 63 | Fill #0

## 2017-11-08 MED FILL — CYCLOBENZAPRINE 5 MG TABLET: 5 | 20 days supply | Qty: 60 | Fill #0

## 2017-12-10 MED FILL — predniSONE 10 MG TABS: 10 | 28 days supply | Qty: 63 | Fill #1

## 2017-12-10 MED FILL — ?PANTOPRAZOLE SO DR 40MG TA: 40 | 30 days supply | Qty: 60 | Fill #3

## 2017-12-10 MED FILL — ALENDRONATE NA 70 MG TAB: 70 | 28 days supply | Qty: 4 | Fill #1

## 2017-12-10 MED FILL — ?CYCLOBENZAPRINE 5MG TABLET: 5 | 20 days supply | Qty: 60 | Fill #1

## 2017-12-10 MED FILL — ?METHOTREXATE 2.5 MG TABLET: 2.5 | 28 days supply | Qty: 32 | Fill #1

## 2017-12-10 MED FILL — FOLIC ACID 1 MG TABS: 1 | 30 days supply | Qty: 30 | Fill #1

## 2017-12-11 MED FILL — ?CYCLOBENZAPRINE 5MG TABLET: 5 | 20 days supply | Qty: 60 | Fill #2

## 2017-12-11 MED FILL — ?METHOTREXATE 2.5 MG TABLET: 2.5 | 28 days supply | Qty: 32 | Fill #2

## 2018-01-10 MED FILL — ALENDRONATE NA 70 MG TAB: 70 | 28 days supply | Qty: 4 | Fill #2

## 2018-01-10 MED FILL — FOLIC ACID 1 MG TABS: 1 | 30 days supply | Qty: 30 | Fill #2

## 2018-01-10 MED FILL — ?PANTOPRAZOLE SO DR 40MG TA: 40 | 30 days supply | Qty: 60 | Fill #4

## 2018-01-10 MED FILL — ?CYCLOBENZAPRINE 5MG TABLET: 5 | 30 days supply | Qty: 60 | Fill #0

## 2018-01-10 MED FILL — ?METHOTREXATE 2.5 MG TABLET: 2.5 | 28 days supply | Qty: 32 | Fill #3

## 2018-01-31 MED FILL — predniSONE 10 MG TABS: 10 | 30 days supply | Qty: 30 | Fill #0

## 2018-02-14 MED FILL — FOLIC ACID 1 MG TABS: 1 | 30 days supply | Qty: 30 | Fill #3

## 2018-02-14 MED FILL — ?CYCLOBENZAPRINE 5MG TABLET: 5 | 30 days supply | Qty: 60 | Fill #1

## 2018-02-14 MED FILL — ?PANTOPRAZOLE SO DR 40MG TA: 40 | 30 days supply | Qty: 60 | Fill #5

## 2018-02-14 MED FILL — ALENDRONATE NA 70 MG TAB: 70 | 28 days supply | Qty: 4 | Fill #3

## 2018-02-14 MED FILL — ?METHOTREXATE 2.5 MG TABLET: 2.5 | 28 days supply | Qty: 32 | Fill #4

## 2018-02-20 MED FILL — CYCLOBENZAPRINE 5 MG TABLET: 5 | 20 days supply | Qty: 60 | Fill #0

## 2018-02-20 MED FILL — predniSONE 10 MG TABS: 10 | 30 days supply | Qty: 30 | Fill #0

## 2018-03-11 MED FILL — ?PANTOPRAZOLE SOD DR 40MG T: 40 | 30 days supply | Qty: 60 | Fill #6

## 2018-03-11 MED FILL — ALENDRONATE NA 70 MG TAB: 70 | 28 days supply | Qty: 4 | Fill #4

## 2018-03-11 MED FILL — ?CYCLOBENZAPRINE 5MG TABLET: 5 | 30 days supply | Qty: 60 | Fill #2

## 2018-03-11 MED FILL — FOLIC ACID 1 MG TABS: 1 | 30 days supply | Qty: 30 | Fill #4

## 2018-03-11 MED FILL — METHOTREXATE SODIUM 2.5 MG: 2.5 | 28 days supply | Qty: 32 | Fill #5

## 2018-03-12 MED FILL — ?PREDNISONE 10 MG TABLET: 10 | 30 days supply | Qty: 55 | Fill #0

## 2018-03-18 MED FILL — !PROVENTIL HFA 90 MCG INH: 108 (90 BAS | 25 days supply | Qty: 1 | Fill #0

## 2018-03-18 MED FILL — NICOTINE 21 MG/24HR PATCH: 21 | 28 days supply | Qty: 28 | Fill #0

## 2018-03-18 MED FILL — DULoxetine HCL 30 MG CPEP: 30 | 15 days supply | Qty: 30 | Fill #0

## 2018-04-03 MED FILL — DULoxetine HCL 30 MG CPEP: 30 | 15 days supply | Qty: 30 | Fill #1

## 2018-04-03 MED FILL — CEPHALEXIN 500 MG CAPSULE: 500 | 14 days supply | Qty: 42 | Fill #0

## 2018-04-09 MED FILL — METHOTREXATE SODIUM 2.5 MG: 2.5 | 27 days supply | Qty: 32 | Fill #0

## 2018-04-09 MED FILL — ?PREDNISONE 10 MG TABLET: 10 | 10 days supply | Qty: 20 | Fill #1

## 2018-04-09 MED FILL — FOLIC ACID 1 MG TABS: 1 | 30 days supply | Qty: 30 | Fill #5

## 2018-04-09 MED FILL — ALENDRONATE NA 70 MG TAB: 70 | 28 days supply | Qty: 4 | Fill #5

## 2018-04-09 MED FILL — CYCLOBENZAPRINE 5 MG TABLET: 5 | 20 days supply | Qty: 60 | Fill #1

## 2018-05-22 MED FILL — ?DULoxetine HCL 30MG CPEP: 30 | 15 days supply | Qty: 30 | Fill #0

## 2018-05-22 MED FILL — METHOTREXATE SODIUM 2.5 MG: 2.5 | 27 days supply | Qty: 32 | Fill #1

## 2018-05-22 MED FILL — ?PREDNISONE 10 MG TABLET: 10 | 30 days supply | Qty: 30 | Fill #1

## 2018-05-22 MED FILL — ALENDRONATE NA 70 MG TAB: 70 | 28 days supply | Qty: 4 | Fill #5

## 2018-05-22 MED FILL — ?FOLIC ACID 1MG TAB: 1 | 30 days supply | Qty: 30 | Fill #6

## 2018-05-22 MED FILL — CYCLOBENZAPRINE 5 MG TABLET: 5 | 20 days supply | Qty: 60 | Fill #2

## 2018-06-11 MED FILL — ?DULoxetine HCL 30MG CPEP: 30 | 15 days supply | Qty: 30 | Fill #1

## 2018-06-11 MED FILL — ?PREDNISONE 10 MG TABLET: 10 | 30 days supply | Qty: 90 | Fill #0

## 2018-06-13 MED FILL — !VENTOLIN HFA INHALER: 108 (90 BAS | 25 days supply | Qty: 18 | Fill #0

## 2018-06-18 MED FILL — ALENDRONATE NA 70 MG TAB: 70 | 28 days supply | Qty: 4 | Fill #0

## 2018-06-18 MED FILL — CYCLOBENZAPRINE 5 MG TABLET: 5 | 20 days supply | Qty: 60 | Fill #0

## 2018-06-18 MED FILL — ?METHOTREXATE 2.5 MG TABLET: 2.5 | 27 days supply | Qty: 32 | Fill #2

## 2018-06-18 MED FILL — ?FOLIC ACID 1MG TAB: 1 | 30 days supply | Qty: 30 | Fill #7

## 2018-06-23 DIAGNOSIS — H25013 Cortical age-related cataract, bilateral: Secondary | ICD-10-CM | POA: Insufficient documentation

## 2018-07-11 MED FILL — !VENTOLIN HFA INHALER: 108 (90 BAS | 16 days supply | Qty: 18 | Fill #0

## 2018-07-11 MED FILL — NICOTINE 21 MG/24HR PATCH: 21 | 28 days supply | Qty: 28 | Fill #0

## 2018-07-15 DIAGNOSIS — K219 Gastro-esophageal reflux disease without esophagitis: Secondary | ICD-10-CM | POA: Insufficient documentation

## 2018-07-15 MED FILL — ?DULoxetine HCL 30MG CPEP: 30 | 15 days supply | Qty: 30 | Fill #2

## 2018-07-15 MED FILL — CEFUROXIME AXETIL 250 MG TA: 250 | 10 days supply | Qty: 20 | Fill #0

## 2018-07-16 MED FILL — PANTOPRAZOLE SOD DR 40 MG T: 40 | 30 days supply | Qty: 30 | Fill #0

## 2018-08-01 MED FILL — ALENDRONATE NA 70 MG TAB: 70 | 28 days supply | Qty: 4 | Fill #1

## 2018-08-01 MED FILL — METHOTREXATE SODIUM 2.5 MG: 2.5 | 27 days supply | Qty: 32 | Fill #3

## 2018-08-01 MED FILL — ?FOLIC ACID 1MG TAB: 1 | 30 days supply | Qty: 30 | Fill #8

## 2018-08-01 MED FILL — ?PREDNISONE 10 MG TABLET: 10 | 21 days supply | Qty: 30 | Fill #0

## 2018-08-01 MED FILL — CYCLOBENZAPRINE 5 MG TABLET: 5 | 20 days supply | Qty: 60 | Fill #1

## 2018-08-05 MED FILL — AMOXICILLIN 500 MG CAPSULE: 500 | 14 days supply | Qty: 28 | Fill #0

## 2018-08-06 MED FILL — ?PANTOPRAZOLE SO DR 40MG TA: 40 | 30 days supply | Qty: 30 | Fill #1

## 2018-08-06 MED FILL — ?DULoxetine HCL 60 MG CPEP: 60 | 30 days supply | Qty: 30 | Fill #0

## 2018-09-03 MED FILL — CYCLOBENZAPRINE 5 MG TABLET: 5 | 20 days supply | Qty: 60 | Fill #2

## 2018-09-03 MED FILL — ALENDRONATE NA 70 MG TAB: 70 | 28 days supply | Qty: 4 | Fill #2

## 2018-09-03 MED FILL — ?PANTOPRAZOLE SO DR 40MG TA: 40 | 30 days supply | Qty: 30 | Fill #0

## 2018-09-03 MED FILL — ?DULoxetine HCL 60 MG CPEP: 60 | 30 days supply | Qty: 30 | Fill #1

## 2018-09-03 MED FILL — ?FOLIC ACID 1MG TAB: 1 | 30 days supply | Qty: 30 | Fill #9

## 2018-09-03 MED FILL — ?METHOTREXATE 2.5 MG TABLET: 2.5 | 28 days supply | Qty: 32 | Fill #4

## 2018-09-03 MED FILL — ?PREDNISONE 10 MG TABLET: 10 | 30 days supply | Qty: 30 | Fill #1

## 2018-09-23 MED FILL — ?PANTOPRAZOLE SODI DR 40MGT: 40 | 30 days supply | Qty: 30 | Fill #1

## 2018-09-23 MED FILL — ?PREDNISONE 10 MG TABLET: 10 | 19 days supply | Qty: 30 | Fill #0

## 2018-09-23 MED FILL — ?FOLIC ACID 1MG TAB: 1 | 30 days supply | Qty: 30 | Fill #10

## 2018-09-23 MED FILL — ?METHOTREXATE 2.5 MG TABLET: 2.5 | 28 days supply | Qty: 32 | Fill #5

## 2018-09-23 MED FILL — ?DULoxetine HCL 60 MG CPEP: 60 | 30 days supply | Qty: 30 | Fill #2

## 2018-09-23 MED FILL — ALENDRONATE NA 70 MG TAB: 70 | 28 days supply | Qty: 4 | Fill #3

## 2018-09-29 MED FILL — ?PREDNISONE 10 MG TABLET: 10 | 23 days supply | Qty: 30 | Fill #0

## 2018-10-06 MED FILL — ?SULFAMETHOXAZOLE-TMP DS TB: 800-160 | 3 days supply | Qty: 6 | Fill #0

## 2018-10-22 MED FILL — CYCLOBENZAPRINE 5 MG TABLET: 5 | 15 days supply | Qty: 30 | Fill #0

## 2018-10-22 MED FILL — ?AMOXICILLIN 500 MG CAPS: 500 | 14 days supply | Qty: 42 | Fill #0

## 2018-10-22 MED FILL — DICLOFENAC SODIUM 1% GEL: 1 | 12 days supply | Qty: 100 | Fill #0

## 2018-10-24 MED FILL — ?PREDNISONE 10 MG TABLET: 10 | 23 days supply | Qty: 30 | Fill #1

## 2018-11-04 MED FILL — PANTOPRAZOLE SOD DR 40 MG T: 40 | 30 days supply | Qty: 30 | Fill #0

## 2018-11-17 MED FILL — ?PREDNISONE 10 MG TABLET: 10 | 30 days supply | Qty: 40 | Fill #2

## 2018-11-24 MED FILL — ?DULOXETINE HCL 60 MG CPEP: 60 | 30 days supply | Qty: 30 | Fill #3

## 2018-11-24 MED FILL — ALENDRONATE NA 70 MG TAB: 70 | 28 days supply | Qty: 4 | Fill #4

## 2018-11-25 MED FILL — ?FOLIC ACID 1MG TAB: 1 | 30 days supply | Qty: 30 | Fill #0

## 2018-11-25 MED FILL — ?METHOTREXATE 2.5 MG TABLET: 2.5 | 28 days supply | Qty: 32 | Fill #0

## 2018-11-25 MED FILL — PANTOPRAZOLE SOD DR 40 MG T: 40 | 30 days supply | Qty: 30 | Fill #1

## 2018-11-26 MED FILL — AMOXICILLIN 500 MG CAPSULE: 500 | 14 days supply | Qty: 28 | Fill #0

## 2018-11-26 MED FILL — CYCLOBENZAPRINE 5 MG TABLET: 5 | 20 days supply | Qty: 60 | Fill #0

## 2018-12-05 MED FILL — predniSONE 10 MG TABS: 10 | 30 days supply | Qty: 30 | Fill #2

## 2018-12-24 MED FILL — ?PREDNISONE 10 MG TABLET: 10 | 30 days supply | Qty: 51 | Fill #0

## 2018-12-24 MED FILL — ?FOLIC ACID 1MG TAB: 1 | 30 days supply | Qty: 30 | Fill #1

## 2018-12-24 MED FILL — ?DULOXETINE HCL 60 MG CPEP: 60 | 30 days supply | Qty: 30 | Fill #0

## 2018-12-25 MED FILL — PANTOPRAZOLE SOD DR 40 MG T: 40 | 30 days supply | Qty: 30 | Fill #0

## 2018-12-26 MED FILL — ?METHOTREXATE 2.5 MG TABLET: 2.5 | 27 days supply | Qty: 32 | Fill #0

## 2018-12-26 MED FILL — ALENDRONATE NA 70 MG TAB: 70 | 28 days supply | Qty: 4 | Fill #0

## 2019-01-02 MED FILL — CYCLOBENZAPRINE 5 MG TABLET: 5 | 20 days supply | Qty: 60 | Fill #1

## 2019-01-21 MED FILL — ?DULOXETINE HCL 60 MG CPEP: 60 | 30 days supply | Qty: 30 | Fill #1

## 2019-01-21 MED FILL — ?FOLIC ACID 1MG TAB: 1 | 30 days supply | Qty: 30 | Fill #2

## 2019-01-21 MED FILL — PANTOPRAZOLE SOD DR 40 MG T: 40 | 30 days supply | Qty: 30 | Fill #1

## 2019-01-21 MED FILL — predniSONE 10 MG TABS: 10 | 30 days supply | Qty: 51 | Fill #1

## 2019-02-20 MED FILL — FOLIC ACID 1 MG TABS: 1 | 30 days supply | Qty: 30 | Fill #3

## 2019-02-20 MED FILL — ?METHOTREXATE 2.5 MG TABLET: 2.5 | 27 days supply | Qty: 32 | Fill #1

## 2019-02-20 MED FILL — ?PREDINSONE 10MG TABLETS: 10 | 30 days supply | Qty: 51 | Fill #2

## 2019-02-20 MED FILL — DULoxetine HCL 60 MG CPEP: 60 | 30 days supply | Qty: 30 | Fill #2

## 2019-02-20 MED FILL — CYCLOBENZAPRINE 5 MG TABLET: 5 | 20 days supply | Qty: 60 | Fill #2

## 2019-03-12 MED FILL — ?PANTOPRAZOLE SO DR 40MG TA: 40 | 30 days supply | Qty: 30 | Fill #0

## 2019-03-12 MED FILL — ALBUTEROL SULFATE HFA 108 (: 108 (90 BAS | 16 days supply | Qty: 18 | Fill #0

## 2019-03-13 MED FILL — ?ALENDRONATE SODIUM 70MG TA: 70 | 28 days supply | Qty: 4 | Fill #0

## 2019-03-13 MED FILL — ?PREDINSONE 10MG TABLETS: 10 | 30 days supply | Qty: 46 | Fill #0

## 2019-03-13 MED FILL — ?METHOTREXATE 2.5 MG TABLET: 2.5 | 28 days supply | Qty: 32 | Fill #0

## 2019-03-16 MED FILL — FERROUS SULFATE 325 MG TAB: 325 (65 FE) | 90 days supply | Qty: 180 | Fill #0

## 2019-03-16 MED FILL — ?ERGOCALCIFEROL 50000 UNITC: 1.25 MG | 30 days supply | Qty: 4 | Fill #0

## 2019-03-19 DIAGNOSIS — D72829 Elevated white blood cell count, unspecified: Secondary | ICD-10-CM | POA: Insufficient documentation

## 2019-04-13 MED FILL — FOLIC ACID 1 MG TABS: 1 | 30 days supply | Qty: 30 | Fill #0

## 2019-04-13 MED FILL — CYCLOBENZAPRINE 5 MG TABLET: 5 | 20 days supply | Qty: 60 | Fill #0

## 2019-04-13 MED FILL — ?PREDINSONE 10MG TABLETS: 10 | 30 days supply | Qty: 46 | Fill #1

## 2019-04-13 MED FILL — ?METHOTREXATE 2.5 MG TABLET: 2.5 | 28 days supply | Qty: 32 | Fill #1

## 2019-04-13 MED FILL — ?PANTOPRAZOLE SO DR 40MG TA: 40 | 30 days supply | Qty: 30 | Fill #1

## 2019-04-21 MED FILL — DULoxetine HCL 30 MG CPEP: 30 | 30 days supply | Qty: 90 | Fill #0

## 2019-05-13 MED FILL — CYCLOBENZAPRINE 5 MG TABLET: 5 | 20 days supply | Qty: 60 | Fill #1

## 2019-05-13 MED FILL — FOLIC ACID 1 MG TABS: 1 | 30 days supply | Qty: 30 | Fill #1

## 2019-05-14 ENCOUNTER — Other Ambulatory Visit: Payer: Self-pay | Admitting: Rheumatology

## 2019-05-14 MED FILL — ?PREDINSONE 10MG TABLETS: 10 | 30 days supply | Qty: 40 | Fill #0

## 2019-05-14 MED FILL — METHOTREXATE SODIUM 2.5 MG: 2.5 | 28 days supply | Qty: 32 | Fill #0

## 2019-05-14 MED FILL — HYDROXYCHLOROQUINE SULFATE: 200 | 30 days supply | Qty: 30 | Fill #0

## 2019-05-14 MED FILL — ?ALENDRONATE SODIUM 70MG TA: 70 | 28 days supply | Qty: 4 | Fill #0

## 2019-05-14 MED FILL — tiZANidine HCL 4 MG TABS: 4 | 10 days supply | Qty: 30 | Fill #0

## 2019-05-14 MED FILL — sulfaSALAzine 500 MG TABS: 500 | 30 days supply | Qty: 60 | Fill #0

## 2019-05-14 MED FILL — ?PANTOPRAZOLE SO DR 40MG TA: 40 | 30 days supply | Qty: 30 | Fill #2

## 2019-06-09 MED FILL — ?PREDINSONE 10MG TABLETS: 10 | 30 days supply | Qty: 40 | Fill #1

## 2019-06-22 MED FILL — ?PANTOPRAZOLE SO DR 40MG TA: 40 | 30 days supply | Qty: 30 | Fill #3

## 2019-06-25 MED FILL — ?PREDINSONE 10MG TABLETS: 10 | 14 days supply | Qty: 42 | Fill #0

## 2019-07-21 MED FILL — FOLIC ACID 1 MG TABS: 1 | 30 days supply | Qty: 30 | Fill #1

## 2019-07-21 MED FILL — DULoxetine HCL 30 MG CPEP: 30 | 30 days supply | Qty: 90 | Fill #2

## 2019-07-21 MED FILL — ?PANTOPRAZOLE SO DR 40MG TA: 40 | 30 days supply | Qty: 30 | Fill #4

## 2019-07-22 MED FILL — ?PREDINSONE 10MG TABLETS: 10 | 25 days supply | Qty: 34 | Fill #2

## 2019-08-17 MED FILL — predniSONE 10 MG TABS: 10 | 31 days supply | Qty: 31 | Fill #2

## 2019-08-20 ENCOUNTER — Other Ambulatory Visit: Payer: Self-pay | Admitting: Rheumatology

## 2019-08-20 MED FILL — CYCLOBENZAPRINE 5 MG TABLET: 5 | 20 days supply | Qty: 60 | Fill #0

## 2019-08-25 MED FILL — FOLIC ACID 1 MG TABS: 1 | 30 days supply | Qty: 30 | Fill #2

## 2019-08-26 MED FILL — PANTOPRAZOLE SOD DR 20 MG T: 20 | 30 days supply | Qty: 30 | Fill #0

## 2019-09-07 MED FILL — CYCLOBENZAPRINE 5 MG TABLET: 5 | 20 days supply | Qty: 60 | Fill #0

## 2019-09-07 MED FILL — PANTOPRAZOLE SOD DR 20 MG T: 20 | 30 days supply | Qty: 30 | Fill #0

## 2019-09-09 MED FILL — predniSONE 10 MG TABS: 10 | 31 days supply | Qty: 31 | Fill #0

## 2019-09-09 MED FILL — FOLIC ACID 1 MG TABS: 1 | 30 days supply | Qty: 30 | Fill #2

## 2019-09-14 ENCOUNTER — Other Ambulatory Visit: Payer: Self-pay | Admitting: Rheumatology

## 2019-09-14 DIAGNOSIS — J42 Unspecified chronic bronchitis: Secondary | ICD-10-CM | POA: Insufficient documentation

## 2019-09-14 MED FILL — METHOTREXATE SODIUM 2.5 MG: 2.5 | 84 days supply | Qty: 96 | Fill #0

## 2019-09-14 MED FILL — HYDROXYCHLOROQUINE 200 MG T: 200 | 30 days supply | Qty: 30 | Fill #0

## 2019-09-14 MED FILL — DULoxetine HCL 60 MG CPEP: 60 | 30 days supply | Qty: 30 | Fill #0

## 2019-09-14 MED FILL — ALENDRONATE NA 70 MG TAB: 70 | 27 days supply | Qty: 4 | Fill #0

## 2019-09-14 MED FILL — AMOXICILLIN 500 MG CAPSULE: 500 | 10 days supply | Qty: 20 | Fill #0

## 2019-09-14 MED FILL — ALBUTEROL SULFATE HFA 108 (: 108 (90 BAS | 25 days supply | Qty: 18 | Fill #0

## 2019-09-14 MED FILL — NICOTINE 21 MG/24HR PATCH: 21 | 28 days supply | Qty: 28 | Fill #0

## 2019-10-05 ENCOUNTER — Telehealth: Payer: Self-pay | Admitting: Family Medicine

## 2019-10-05 NOTE — Telephone Encounter (Signed)
Copied from [place] [phone]. Topic: General - Other >> Oct 05, 2019 11:36 AM Amy Hall wrote: Reason for CRM: Pt called stating that she is needing to renew her blue card. Please advise.

## 2019-10-05 NOTE — Telephone Encounter (Signed)
Unable to reach or LVM to the PT, but any way in referent to re-apply for the CAFA, OC program and the blue card, she need to be reestablish care with the provider since last time she saw the provider was on 09/25/2017, she is a new Pt at this time

## 2019-10-06 MED FILL — predniSONE 10 MG TABS: 10 | 31 days supply | Qty: 31 | Fill #0

## 2019-10-15 MED FILL — CYCLOBENZAPRINE 5 MG TABLET: 5 | 20 days supply | Qty: 60 | Fill #1

## 2019-10-15 MED FILL — PANTOPRAZOLE SOD DR 20 MG T: 20 | 30 days supply | Qty: 30 | Fill #1

## 2019-10-15 MED FILL — FOLIC ACID 1 MG TABS: 1 | 30 days supply | Qty: 30 | Fill #3

## 2019-10-15 MED FILL — HYDROXYCHLOROQUINE 200 MG T: 200 | 30 days supply | Qty: 30 | Fill #1

## 2019-10-21 ENCOUNTER — Telehealth: Payer: Self-pay | Admitting: Family Medicine

## 2019-10-21 NOTE — Telephone Encounter (Signed)
Returned patient call and voice mail is full. Before the patient can re apply for the financial assistance she needs to est care. Patient was last seen in 2019.   Copied from [place] [phone]. Topic: General - Other >> Oct 21, 2019  2:00 PM Gillis Ends D wrote: Reason for CRM: Patient called and needs an appointment with Clifton James. She can be reached at [phone]. Please advise

## 2019-10-23 ENCOUNTER — Other Ambulatory Visit: Payer: Self-pay | Admitting: Internal Medicine

## 2019-10-23 MED FILL — predniSONE 10 MG TABS: 10 | 30 days supply | Qty: 30 | Fill #0

## 2019-11-09 MED FILL — CYCLOBENZAPRINE 5 MG TABLET: 5 | 20 days supply | Qty: 60 | Fill #2

## 2019-11-09 MED FILL — FOLIC ACID 1 MG TABS: 1 | 30 days supply | Qty: 30 | Fill #4

## 2019-11-09 MED FILL — HYDROXYCHLOROQUINE 200 MG T: 200 | 30 days supply | Qty: 30 | Fill #2

## 2019-11-09 MED FILL — PANTOPRAZOLE SOD DR 20 MG T: 20 | 30 days supply | Qty: 30 | Fill #0

## 2019-11-10 ENCOUNTER — Other Ambulatory Visit: Payer: Self-pay | Admitting: Rheumatology

## 2019-11-13 MED FILL — predniSONE 10 MG TABS: 10 | 8 days supply | Qty: 13 | Fill #0

## 2019-11-23 MED FILL — predniSONE 10 MG TABS: 10 | 30 days supply | Qty: 30 | Fill #1

## 2019-12-14 MED FILL — PANTOPRAZOLE SOD DR 20 MG T: 20 | 30 days supply | Qty: 30 | Fill #1

## 2019-12-14 MED FILL — FOLIC ACID 1 MG TABS: 1 | 30 days supply | Qty: 30 | Fill #5

## 2019-12-14 MED FILL — ?HYDROXYCHLOROQUINE 200 MG: 200 | 30 days supply | Qty: 30 | Fill #3

## 2019-12-14 MED FILL — ?DULOXetine HCL 60MG CAPS: 60 | 30 days supply | Qty: 30 | Fill #1

## 2019-12-18 MED FILL — predniSONE 10 MG TABS: 10 | 30 days supply | Qty: 30 | Fill #2

## 2020-01-12 ENCOUNTER — Other Ambulatory Visit: Payer: Self-pay | Admitting: Internal Medicine

## 2020-01-12 MED FILL — FOLIC ACID 1 MG TABS: 1 | 30 days supply | Qty: 30 | Fill #6

## 2020-01-12 MED FILL — CYCLOBENZAPRINE 5 MG TABLET: 5 | 20 days supply | Qty: 60 | Fill #0

## 2020-01-12 MED FILL — predniSONE 10 MG TABS: 10 | 30 days supply | Qty: 30 | Fill #0

## 2020-01-13 ENCOUNTER — Other Ambulatory Visit: Payer: Self-pay | Admitting: Family Medicine

## 2020-01-13 MED FILL — PANTOPRAZOLE SOD DR 20 MG T: 20 | 30 days supply | Qty: 30 | Fill #0

## 2020-01-20 MED FILL — ?METHOTREXATE 2.5 MG TABLET: 2.5 | 28 days supply | Qty: 32 | Fill #1

## 2020-01-20 MED FILL — ?DULOXetine HCL 60MG CAPS: 60 | 30 days supply | Qty: 30 | Fill #2

## 2020-01-20 MED FILL — ?HYDROXYCHLOROQUINE 200 MG: 200 | 30 days supply | Qty: 30 | Fill #4

## 2020-02-18 ENCOUNTER — Other Ambulatory Visit: Payer: Self-pay | Admitting: Rheumatology

## 2020-02-18 MED FILL — ALENDRONATE NA 70 MG TAB: 70 | 28 days supply | Qty: 4 | Fill #0

## 2020-02-18 MED FILL — ?METHOTREXATE 2.5 MG TABLET: 2.5 | 28 days supply | Qty: 32 | Fill #0

## 2020-02-25 MED FILL — FOLIC ACID 1 MG TABS: 1 | 30 days supply | Qty: 30 | Fill #7

## 2020-02-25 MED FILL — PANTOPRAZOLE SOD DR 20 MG T: 20 | 30 days supply | Qty: 30 | Fill #1

## 2020-02-25 MED FILL — DULoxetine HCL 60 MG CPEP: 60 | 30 days supply | Qty: 30 | Fill #3

## 2020-02-25 MED FILL — CYCLOBENZAPRINE 5 MG TABLET: 5 | 20 days supply | Qty: 60 | Fill #1

## 2020-02-25 MED FILL — predniSONE 10 MG TABS: 10 | 30 days supply | Qty: 30 | Fill #1

## 2020-03-22 MED FILL — !CYMBALTA 60MG CAPSULE: 60 | 30 days supply | Qty: 30 | Fill #4

## 2020-03-22 MED FILL — FOLIC ACID 1 MG TABS: 1 | 30 days supply | Qty: 30 | Fill #8

## 2020-03-22 MED FILL — ?HYDROXYCHLOROQUINE 200 MG: 200 | 30 days supply | Qty: 30 | Fill #5

## 2020-03-24 ENCOUNTER — Other Ambulatory Visit: Payer: Self-pay | Admitting: Rheumatology

## 2020-03-24 MED FILL — predniSONE 10 MG TABS: 10 | 30 days supply | Qty: 30 | Fill #0

## 2020-04-14 ENCOUNTER — Other Ambulatory Visit: Payer: Self-pay | Admitting: Family Medicine

## 2020-04-14 MED FILL — DULoxetine HCL 60 MG CPEP: 60 | 30 days supply | Qty: 30 | Fill #5

## 2020-04-16 ENCOUNTER — Other Ambulatory Visit: Payer: Self-pay

## 2020-04-16 MED FILL — Pantoprazole Sodium EC Tab 20 MG (Base Equiv): ORAL | 30 days supply | Qty: 30 | Fill #0 | Status: CN

## 2020-04-17 ENCOUNTER — Other Ambulatory Visit: Payer: Self-pay

## 2020-04-25 ENCOUNTER — Other Ambulatory Visit: Payer: Self-pay

## 2020-04-26 ENCOUNTER — Other Ambulatory Visit: Payer: Self-pay

## 2020-04-26 MED FILL — Prednisone Tab 10 MG: ORAL | 30 days supply | Qty: 30 | Fill #0 | Status: AC

## 2020-04-26 MED FILL — Pantoprazole Sodium EC Tab 20 MG (Base Equiv): ORAL | 30 days supply | Qty: 30 | Fill #0 | Status: AC

## 2020-05-02 ENCOUNTER — Other Ambulatory Visit: Payer: Self-pay

## 2020-05-11 ENCOUNTER — Other Ambulatory Visit: Payer: Self-pay

## 2020-05-11 MED FILL — Folic Acid Tab 1 MG: ORAL | 30 days supply | Qty: 30 | Fill #0 | Status: AC

## 2020-05-11 MED FILL — Alendronate Sodium Tab 70 MG: ORAL | 28 days supply | Qty: 4 | Fill #0 | Status: AC

## 2020-05-13 ENCOUNTER — Other Ambulatory Visit: Payer: Self-pay

## 2020-05-13 MED ORDER — PREDNISONE 5 MG PO TABS
ORAL_TABLET | ORAL | 0 refills | Status: DC
  Filled 2020-05-13: qty 53, 45d supply, fill #0

## 2020-05-16 ENCOUNTER — Other Ambulatory Visit: Payer: Self-pay

## 2020-05-17 ENCOUNTER — Other Ambulatory Visit: Payer: Self-pay

## 2020-05-17 DIAGNOSIS — F1410 Cocaine abuse, uncomplicated: Secondary | ICD-10-CM | POA: Insufficient documentation

## 2020-05-18 ENCOUNTER — Other Ambulatory Visit: Payer: Self-pay

## 2020-05-20 ENCOUNTER — Other Ambulatory Visit: Payer: Self-pay

## 2020-05-23 ENCOUNTER — Other Ambulatory Visit: Payer: Self-pay

## 2020-05-24 ENCOUNTER — Other Ambulatory Visit: Payer: Self-pay

## 2020-05-24 MED ORDER — DULOXETINE HCL 60 MG PO CPEP
ORAL_CAPSULE | ORAL | 1 refills | Status: DC
  Filled 2020-05-24 – 2020-08-26 (×2): qty 30, 30d supply, fill #0
  Filled 2020-10-07 – 2020-11-22 (×2): qty 30, 30d supply, fill #1

## 2020-05-25 ENCOUNTER — Other Ambulatory Visit: Payer: Self-pay

## 2020-06-01 ENCOUNTER — Other Ambulatory Visit: Payer: Self-pay

## 2020-06-08 ENCOUNTER — Other Ambulatory Visit: Payer: Self-pay

## 2020-06-10 ENCOUNTER — Other Ambulatory Visit: Payer: Self-pay

## 2020-06-10 MED ORDER — DULOXETINE HCL 60 MG PO CPEP
ORAL_CAPSULE | ORAL | 1 refills | Status: DC
  Filled 2020-06-10: qty 30, 30d supply, fill #0
  Filled 2020-07-25: qty 30, 30d supply, fill #1

## 2020-06-10 MED ORDER — CYCLOBENZAPRINE HCL 5 MG PO TABS
ORAL_TABLET | ORAL | 1 refills | Status: DC
  Filled 2020-06-10: qty 30, 15d supply, fill #0
  Filled 2020-08-26: qty 30, 15d supply, fill #1

## 2020-06-10 MED ORDER — PANTOPRAZOLE SODIUM 20 MG PO TBEC
DELAYED_RELEASE_TABLET | ORAL | 3 refills | Status: AC
  Filled 2020-06-10: qty 30, 30d supply, fill #0
  Filled 2020-07-25: qty 30, 30d supply, fill #1
  Filled 2020-08-26: qty 30, 30d supply, fill #2
  Filled 2020-10-07: qty 30, 30d supply, fill #3

## 2020-06-14 ENCOUNTER — Other Ambulatory Visit: Payer: Self-pay

## 2020-06-14 MED ORDER — CYCLOBENZAPRINE HCL 5 MG PO TABS
ORAL_TABLET | ORAL | 1 refills | Status: DC
  Filled 2020-06-14 – 2020-10-07 (×2): qty 30, 15d supply, fill #0

## 2020-06-16 ENCOUNTER — Other Ambulatory Visit: Payer: Self-pay

## 2020-06-20 ENCOUNTER — Other Ambulatory Visit: Payer: Self-pay

## 2020-06-21 ENCOUNTER — Other Ambulatory Visit: Payer: Self-pay

## 2020-07-25 ENCOUNTER — Other Ambulatory Visit: Payer: Self-pay

## 2020-07-25 MED FILL — Alendronate Sodium Tab 70 MG: ORAL | 28 days supply | Qty: 4 | Fill #1 | Status: AC

## 2020-08-26 ENCOUNTER — Other Ambulatory Visit: Payer: Self-pay

## 2020-08-30 ENCOUNTER — Other Ambulatory Visit: Payer: Self-pay

## 2020-09-13 ENCOUNTER — Other Ambulatory Visit: Payer: Self-pay

## 2020-09-21 ENCOUNTER — Other Ambulatory Visit: Payer: Self-pay

## 2020-09-26 ENCOUNTER — Other Ambulatory Visit: Payer: Self-pay

## 2020-09-26 MED ORDER — PREDNISONE 5 MG PO TABS
ORAL_TABLET | ORAL | 1 refills | Status: DC
  Filled 2020-11-22: qty 45, 30d supply, fill #0

## 2020-09-26 MED ORDER — ALENDRONATE SODIUM 70 MG PO TABS
ORAL_TABLET | ORAL | 1 refills | Status: DC
  Filled 2020-11-22 – 2020-12-13 (×2): qty 4, 28d supply, fill #0

## 2020-10-03 ENCOUNTER — Other Ambulatory Visit: Payer: Self-pay

## 2020-10-03 MED ORDER — PREDNISONE 5 MG PO TABS
ORAL_TABLET | ORAL | 4 refills | Status: DC
  Filled 2020-10-03 – 2020-10-20 (×2): qty 45, 30d supply, fill #0

## 2020-10-07 ENCOUNTER — Other Ambulatory Visit: Payer: Self-pay

## 2020-10-07 ENCOUNTER — Other Ambulatory Visit: Payer: Self-pay | Admitting: Family Medicine

## 2020-10-07 NOTE — Telephone Encounter (Signed)
Requested medication (s) are due for refill today - no  Requested medication (s) are on the active medication list -no  Future visit scheduled -no  Last refill: 05/11/20  Notes to clinic: Request RF: medication no longer current on medication list  Requested Prescriptions  Pending Prescriptions Disp Refills   folic acid (FOLVITE) 1 MG tablet 90 tablet 3    Sig: TAKE 1 TABLET (1 MG TOTAL) BY MOUTH DAILY.     Endocrinology:  Vitamins Failed - 10/07/2020 10:16 AM      Failed - Valid encounter within last 12 months    Recent Outpatient Visits           3 years ago Burning with urination   [place], Charlane Ferretti, MD   3 years ago Spinal stenosis of lumbar region with neurogenic claudication   [place] Charlott Rakes, MD   4 years ago Annual physical exam   [place] Charlott Rakes, MD   4 years ago Acute non-recurrent maxillary sinusitis   [place], Charlane Ferretti, MD   4 years ago Screening for diabetes mellitus   [place], Charlane Ferretti, MD                 Requested Prescriptions  Pending Prescriptions Disp Refills   folic acid (FOLVITE) 1 MG tablet 90 tablet 3    Sig: TAKE 1 TABLET (1 MG TOTAL) BY MOUTH DAILY.     Endocrinology:  Vitamins Failed - 10/07/2020 10:16 AM      Failed - Valid encounter within last 12 months    Recent Outpatient Visits           3 years ago Burning with urination   [place], Charlane Ferretti, MD   3 years ago Spinal stenosis of lumbar region with neurogenic claudication   [place], Enobong, MD   4 years ago Annual physical exam   [place], Enobong, MD   4 years ago Acute non-recurrent maxillary sinusitis   [place],  Enobong, MD   4 years ago Screening for diabetes mellitus   [facility] Charlott Rakes, MD

## 2020-10-10 ENCOUNTER — Other Ambulatory Visit: Payer: Self-pay

## 2020-10-12 ENCOUNTER — Other Ambulatory Visit: Payer: Self-pay

## 2020-10-13 ENCOUNTER — Other Ambulatory Visit: Payer: Self-pay

## 2020-10-13 MED ORDER — CYCLOBENZAPRINE HCL 5 MG PO TABS
ORAL_TABLET | ORAL | 1 refills | Status: DC
  Filled 2020-10-13: qty 30, 30d supply, fill #0
  Filled 2020-11-22 – 2020-12-13 (×2): qty 30, 15d supply, fill #0

## 2020-10-13 MED ORDER — CEPHALEXIN 500 MG PO CAPS
500.0000 mg | ORAL_CAPSULE | Freq: Two times a day (BID) | ORAL | 0 refills | Status: AC
  Filled 2020-10-13 – 2020-10-20 (×2): qty 20, 10d supply, fill #0

## 2020-10-13 MED ORDER — HYDROXYZINE PAMOATE 25 MG PO CAPS
ORAL_CAPSULE | ORAL | 2 refills | Status: AC
  Filled 2020-10-13: qty 30, 30d supply, fill #0
  Filled 2020-10-20: qty 30, 10d supply, fill #0
  Filled 2020-11-22: qty 30, 10d supply, fill #1

## 2020-10-13 MED ORDER — PANTOPRAZOLE SODIUM 20 MG PO TBEC
DELAYED_RELEASE_TABLET | ORAL | 3 refills | Status: DC
  Filled 2020-10-13 – 2020-12-13 (×3): qty 30, 30d supply, fill #0

## 2020-10-13 MED ORDER — FOLIC ACID 1 MG PO TABS
ORAL_TABLET | ORAL | 1 refills | Status: DC
  Filled 2020-10-13: qty 30, 30d supply, fill #0

## 2020-10-13 MED ORDER — DULOXETINE HCL 30 MG PO CPEP
ORAL_CAPSULE | ORAL | 2 refills | Status: AC
  Filled 2020-10-13 – 2020-12-13 (×3): qty 90, 30d supply, fill #0

## 2020-10-17 ENCOUNTER — Other Ambulatory Visit: Payer: Self-pay

## 2020-10-20 ENCOUNTER — Other Ambulatory Visit: Payer: Self-pay

## 2020-11-22 ENCOUNTER — Other Ambulatory Visit: Payer: Self-pay

## 2020-12-13 ENCOUNTER — Other Ambulatory Visit: Payer: Self-pay

## 2020-12-19 ENCOUNTER — Other Ambulatory Visit: Payer: Self-pay

## 2021-01-01 ENCOUNTER — Emergency Department (HOSPITAL_COMMUNITY): Payer: Medicaid Other

## 2021-01-01 ENCOUNTER — Emergency Department (HOSPITAL_COMMUNITY)
Admission: EM | Admit: 2021-01-01 | Discharge: 2021-01-01 | Disposition: A | Discharge status: Home / Self Care | Payer: Medicaid Other | Attending: Emergency Medicine | Admitting: Emergency Medicine

## 2021-01-01 ENCOUNTER — Other Ambulatory Visit: Payer: Self-pay

## 2021-01-01 ENCOUNTER — Encounter (HOSPITAL_COMMUNITY): Payer: Self-pay

## 2021-01-01 DIAGNOSIS — F17210 Nicotine dependence, cigarettes, uncomplicated: Secondary | ICD-10-CM | POA: Insufficient documentation

## 2021-01-01 DIAGNOSIS — W010XXA Fall on same level from slipping, tripping and stumbling without subsequent striking against object, initial encounter: Secondary | ICD-10-CM | POA: Insufficient documentation

## 2021-01-01 DIAGNOSIS — S300XXA Contusion of lower back and pelvis, initial encounter: Secondary | ICD-10-CM | POA: Insufficient documentation

## 2021-01-01 DIAGNOSIS — J45909 Unspecified asthma, uncomplicated: Secondary | ICD-10-CM | POA: Diagnosis not present

## 2021-01-01 DIAGNOSIS — Z79899 Other long term (current) drug therapy: Secondary | ICD-10-CM | POA: Diagnosis not present

## 2021-01-01 DIAGNOSIS — W19XXXA Unspecified fall, initial encounter: Secondary | ICD-10-CM

## 2021-01-01 DIAGNOSIS — S3992XA Unspecified injury of lower back, initial encounter: Secondary | ICD-10-CM | POA: Diagnosis present

## 2021-01-01 MED ORDER — HYDROCODONE-ACETAMINOPHEN 5-325 MG PO TABS
1.0000 | ORAL_TABLET | Freq: Once | ORAL | Status: AC
  Administered 2021-01-01: 22:00:00 1 via ORAL
  Filled 2021-01-01: qty 1

## 2021-01-01 NOTE — ED Triage Notes (Addendum)
Pt presents with pain to tailbone after tripping over her cats this morning. Pt landed on bottom. Pt denies hitting head or LOC. No blood thinners. Describes pain as cramping and runs down right leg.   Pt ambulatory to triage

## 2021-01-01 NOTE — ED Provider Notes (Signed)
[facility] EMERGENCY DEPARTMENT Provider Note   CSN: [id number] Arrival date & time: 01/01/21  2055     History Chief Complaint  Patient presents with   Amy Hall    Amy Hall is a 55 y.o. female.  HPI She complains of fall when tripped over care earlier today at 9 AM.  Since then she has noticed pain in the right buttock region which radiates to the right upper thigh.  She is ambulatory and presents now for pain.  She took Aleve with out relief.  No prior injuries to the same site.  No other complaints.  There are no other known modifying factors    Past Medical History:  Diagnosis Date   Arthritis    Asthma    DDD (degenerative disc disease), lumbar    Osteoporosis of lumbar spine    Rheumatoid arthritis ([place])    Sleep disorder     Patient Active Problem List   Diagnosis Date Noted   Esophageal thrush ([place]) 10/08/2016   Sepsis associated hypotension ([place]) 10/06/2016   Acute lower UTI 10/06/2016   Thrush 10/06/2016   Gastritis 09/20/2016   Iron deficiency anemia due to chronic blood loss 04/06/2016   B12 deficiency anemia 04/06/2016   B12 deficiency    GI bleed 04/04/2016   Asthma exacerbation 04/03/2016   Tobacco abuse 04/03/2016   UTI (urinary tract infection) 04/03/2016   Sepsis ([place]) 04/03/2016   Symptomatic anemia 04/03/2016   Allergic reaction 04/03/2016   Anemia 04/03/2016   Nausea & vomiting 04/03/2016   Closed nondisplaced fracture of third metatarsal bone of left foot 12/05/2015   Valgus deformity of great toe 12/05/2015   Hammer toes of both feet 12/05/2015   Lumbar radiculitis 11/24/2015   Rheumatoid arthritis ([place]) 09/02/2015   Degenerative disc disease, lumbar 09/02/2015   Spinal stenosis of lumbar region 09/02/2015    Past Surgical History:  Procedure Laterality Date   ELBOW SURGERY     ESOPHAGOGASTRODUODENOSCOPY (EGD) WITH PROPOFOL Left 04/05/2016   Procedure: ESOPHAGOGASTRODUODENOSCOPY (EGD) WITH PROPOFOL;  Surgeon: Arta Silence, MD;   Location: [facility] ENDOSCOPY;  Service: Endoscopy;  Laterality: Left;     OB History   No obstetric history on file.     Family History  Problem Relation Age of Onset   Anemia Mother    Hypertension Father    Heart disease Father    Hypertension Sister     Social History   Tobacco Use   Smoking status: Every Day    Packs/day: 1.00    Types: Cigarettes   Smokeless tobacco: Never  Substance Use Topics   Alcohol use: Yes    Alcohol/week: 1.0 - 2.0 standard drink    Types: 1 - 2 Cans of beer per week    Comment: occ   Drug use: Not Currently    Types: Cocaine    Comment: recently quit using    Home Medications Prior to Admission medications   Medication Sig Start Date End Date Taking? Authorizing Provider  Adalimumab 40 MG/0.4ML PNKT Inject into the skin. 06/04/17   [provider]  albuterol (PROVENTIL HFA;VENTOLIN HFA) 108 (90 Base) MCG/ACT inhaler Inhale 2 puffs into the lungs every 6 (six) hours as needed for wheezing or shortness of breath. 03/20/16   Charlott Rakes, MD  alendronate (FOSAMAX) 70 MG tablet Take 70 mg by mouth once a week. 09/14/16   [provider]  alendronate (FOSAMAX) 70 MG tablet TAKE 1 TABLET BY MOUTH PER WEEK WITH FULL GLASS OF WATER,  ON EMPTY STOMACH, AND WAIT 30MINUTES BEFORE EATING FOOD. 02/18/20 02/17/21  Mercie Eon, MD  alendronate (FOSAMAX) 70 MG tablet TAKE 1 TABLET PER WEEK WITH FULL GLASS OF WATER ON EMPTY STOMACH & WAIT 30 MINUTES BEFORE EATING FOOD 09/26/20     b complex-C-folic acid 1 MG capsule Take 1 capsule (1 mg total) by mouth daily. 04/06/16   Rosita Fire, MD  cephALEXin (KEFLEX) 500 MG capsule Take 1 capsule (500 mg total) by mouth 2 (two) times daily. 10/13/20     cetirizine (ZYRTEC) 10 MG tablet Take 1 tablet (10 mg total) by mouth daily. Patient not taking: Reported on 12/17/2016 03/12/16   Charlott Rakes, MD  cyclobenzaprine (FLEXERIL) 5 MG tablet TAKE 1 TABLET BY MOUTH TWICE DAILY AS NEEDED FOR MUSCLE SPASMS  02/12/17   Charlott Rakes, MD  cyclobenzaprine (FLEXERIL) 5 MG tablet TAKE 1 TABLET BY MOUTH EVERY 8 HOURS AS NEEDED 01/12/20 01/11/21  Wendall Mola, MD  cyclobenzaprine (FLEXERIL) 5 MG tablet Take one tablet (5 mg dose) by mouth 2 (two) times a day as needed for Muscle spasms. 06/10/20     cyclobenzaprine (FLEXERIL) 5 MG tablet Take one tablet (5 mg dose) by mouth 2 (two) times a day as needed for Muscle spasms. 06/14/20     cyclobenzaprine (FLEXERIL) 5 MG tablet Take one tablet (5 mg dose) by mouth 2 (two) times a day as needed for Muscle spasms. 10/13/20     dextromethorphan-guaiFENesin (MUCINEX DM) 30-600 MG 12hr tablet Take 1 tablet by mouth 2 (two) times daily as needed for cough. Patient not taking: Reported on 12/17/2016 04/06/16   Rosita Fire, MD  DULoxetine (CYMBALTA) 30 MG capsule Take three capsules (90 mg dose) by mouth at bedtime. 10/13/20     DULoxetine (CYMBALTA) 60 MG capsule TAKE ONE CAPSULE (60 MG DOSE) BY MOUTH AT BEDTIME. 09/14/19 09/13/20  Ramos, Tracie Harrier C, NP-C  DULoxetine (CYMBALTA) 60 MG capsule Take one capsule (60 mg dose) by mouth at bedtime. 05/24/20     DULoxetine (CYMBALTA) 60 MG capsule Take one capsule (60 mg dose) by mouth at bedtime. 06/10/20     ferrous sulfate 325 (65 FE) MG tablet Take 1 tablet (325 mg total) by mouth 2 (two) times daily with a meal. 10/08/16   Janece Canterbury, MD  fluconazole (DIFLUCAN) 200 MG tablet Take 1 tablet (200 mg total) by mouth daily. Patient not taking: Reported on 12/17/2016 10/09/16   Janece Canterbury, MD  hydrocortisone ointment 0.5 % Apply 1 application topically 2 (two) times daily. 09/25/17   Charlott Rakes, MD  hydrOXYzine (VISTARIL) 25 MG capsule Take one capsule (25 mg dose) by mouth 3 (three) times a day as needed. 10/13/20     methotrexate (RHEUMATREX) 2.5 MG tablet Take 15 mg by mouth once a week. 09/14/16   [provider]  methotrexate (RHEUMATREX) 2.5 MG tablet TAKE 8 TABLETS BY MOUTH EVERY [facility] 02/18/20 02/17/21   Mercie Eon, MD  omeprazole (PRILOSEC) 40 MG capsule Take 40 mg by mouth daily. 09/14/16   [provider]  pantoprazole (PROTONIX) 20 MG tablet TAKE ONE TABLET (20 MG DOSE) BY MOUTH DAILY. 04/14/20 04/14/21  Ulyses Jarred C, NP-C  pantoprazole (PROTONIX) 20 MG tablet Take one tablet (20 mg dose) by mouth daily. 06/10/20     pantoprazole (PROTONIX) 20 MG tablet Take one tablet (20 mg dose) by mouth daily. 10/13/20     predniSONE (DELTASONE) 10 MG tablet TAKE 1 TABLET (10 MG TOTAL) BY MOUTH DAILY  FOR 30 DAYS. 03/24/20 03/24/21  Mercie Eon, MD  predniSONE (DELTASONE) 10 MG tablet TAKE 1 TABLET (10 MG TOTAL) BY MOUTH DAILY FOR 30 DAYS. 11/10/19 11/09/20  Lanice Schwab, DO  predniSONE (DELTASONE) 10 MG tablet TAKE 3 TABLETS BY MOUTH FOR 2 DAYS, THEN 2 TABLETS FOR 2 DAYS, THEN 1 TABLET FOR 2 DAYS, THEN 1/2 TABLET FOR 2 DAYS THEN STOP. 11/10/19 11/09/20  Lanice Schwab, DO  predniSONE (DELTASONE) 10 MG tablet TAKE 1 TABLET BY MOUTH DAILY 10/23/19 10/22/20  Girard Cooter, MD  predniSONE (DELTASONE) 20 MG tablet Take 1 tablet (20 mg total) by mouth daily with breakfast. 10/08/16   Janece Canterbury, MD  predniSONE (DELTASONE) 5 MG tablet In 2 weeks, take 3 tabs daily x 2 weeks, then decrease to 2 tabs daily x 2 weeks, then 1 tab daily thereafter Patient not taking: Reported on 12/17/2016 10/08/16   Janece Canterbury, MD  predniSONE (DELTASONE) 5 MG tablet TAKE 1 AND 1/2 TABLET BY MOUTH DAILY 09/26/20     predniSONE (DELTASONE) 5 MG tablet Take 1.5 tablets (7.5 mg total) by mouth daily. 10/03/20       Allergies    Gabapentin  Review of Systems   Review of Systems  All other systems reviewed and are negative.  Physical Exam Updated Vital Signs BP 110/69   Pulse 92   Temp 97.9 F (36.6 C) (Oral)   Resp 18   Ht 5\' 2"  (1.575 m)   Wt 56.7 kg   LMP 01/16/2015   SpO2 98%   BMI 22.86 kg/m   Physical Exam Vitals and nursing note reviewed.  Constitutional:      General: She is  not in acute distress.    Appearance: She is well-developed. She is not ill-appearing, toxic-appearing or diaphoretic.  HENT:     Head: Normocephalic and atraumatic.     Right Ear: External ear normal.     Left Ear: External ear normal.  Eyes:     Conjunctiva/sclera: Conjunctivae normal.     Pupils: Pupils are equal, round, and reactive to light.  Neck:     Trachea: Phonation normal.  Cardiovascular:     Rate and Rhythm: Normal rate.  Pulmonary:     Effort: Pulmonary effort is normal.  Abdominal:     General: There is no distension.  Musculoskeletal:        General: Normal range of motion.     Cervical back: Normal range of motion and neck supple.     Comments: Mild tenderness right SI joint and right buttock.  Normal range of motion arms and legs bilaterally.  Skin:    General: Skin is warm and dry.  Neurological:     Mental Status: She is alert and oriented to person, place, and time.     Cranial Nerves: No cranial nerve deficit.     Sensory: No sensory deficit.     Motor: No abnormal muscle tone.     Coordination: Coordination normal.  Psychiatric:        Mood and Affect: Mood normal.        Behavior: Behavior normal.        Thought Content: Thought content normal.        Judgment: Judgment normal.    ED Results / Procedures / Treatments   Labs (all labs ordered are listed, but only abnormal results are displayed) Labs Reviewed - No data to display  EKG None  Radiology DG Pelvis 1-2 Views  Result Date: 01/01/2021  CLINICAL DATA:  Recent trip and fall with pelvic pain, initial encounter EXAM: PELVIS - 1 VIEW COMPARISON:  None. FINDINGS: Pelvic ring is intact. No acute fracture or dislocation is noted. No soft tissue abnormality is noted. Degenerative changes of the lumbar spine are seen. IMPRESSION: No acute abnormality noted. Electronically Signed   By: Inez Catalina M.D.   On: 01/01/2021 22:25    Procedures Procedures   Medications Ordered in ED Medications   HYDROcodone-acetaminophen (NORCO/VICODIN) 5-325 MG per tablet 1 tablet (1 tablet Oral Given 01/01/21 2213)    ED Course  I have reviewed the triage vital signs and the nursing notes.  Pertinent labs & imaging results that were available during my care of the patient were reviewed by me and considered in my medical decision making (see chart for details).    MDM Rules/Calculators/A&P                          Patient Vitals for the past 24 hrs:  BP Temp Temp src Pulse Resp SpO2 Height Weight  01/01/21 2130 110/69 -- -- 92 18 98 % -- --  01/01/21 2115 119/67 -- -- 97 18 96 % -- --  01/01/21 2111 -- -- -- -- 19 -- -- --  01/01/21 2110 (!) 149/78 97.9 F (36.6 C) Oral (!) 104 -- 100 % -- --  01/01/21 2107 -- -- -- -- -- -- 5\' 2"  (1.575 m) 56.7 kg    10:38 PM Reevaluation with update and discussion. After initial assessment and treatment, an updated evaluation reveals sitting comfortably in bed.  No further complaints.  Findings discussed and questions answered. Daleen Bo   Medical Decision Making:  This patient is presenting for evaluation of pain after fall, which does require a range of treatment options, and is a complaint that involves a moderate risk of morbidity and mortality. The differential diagnoses include contusion, fracture. I decided to review old records, and in summary middle-aged female with history of degenerative joint disease of the lumbar spine, spinal stenosis lumbar, tobacco abuse, presenting with mechanical fall.  I did not require additional historical information from any 1.   Radiologic Tests Ordered, included pelvis x-ray.  I independently Visualized: Radiograph images, which show no fracture or dislocation    Critical Interventions-clinical evaluation, pain medication, imaging  After These Interventions, the Patient was reevaluated and was found clinical evaluation, radiography were normal.  No indication for further hospitalization or ED  intervention.  CRITICAL CARE-no Performed by: Daleen Bo  Nursing Notes Reviewed/ Care Coordinated Applicable Imaging Reviewed Interpretation of Laboratory Data incorporated into ED treatment  The patient appears reasonably screened and/or stabilized for discharge and I doubt any other medical condition or other [facility] requiring further screening, evaluation, or treatment in the ED at this time prior to discharge.  Plan: Home Medications-continue usual, Motrin for pain; Home Treatments-gradually advance activity; return here if the recommended treatment, does not improve the symptoms; Recommended follow up-PCP, PRN        Final Clinical Impression(s) / ED Diagnoses Final diagnoses:  Fall, initial encounter  Contusion of buttock, initial encounter    Rx / DC Orders ED Discharge Orders     None        Daleen Bo, MD 01/01/21 2239

## 2021-01-01 NOTE — Discharge Instructions (Signed)
There were no serious injuries from the fall.  Use ice on the sore area 3-4 times a day.  Take Motrin, 3 times a day with meals for pain.  See your doctor as needed for problems.

## 2021-01-09 ENCOUNTER — Other Ambulatory Visit: Payer: Self-pay

## 2021-01-09 ENCOUNTER — Emergency Department (HOSPITAL_COMMUNITY)
Admission: EM | Admit: 2021-01-09 | Discharge: 2021-01-09 | Disposition: A | Discharge status: Home / Self Care | Payer: Medicaid Other | Attending: Emergency Medicine | Admitting: Emergency Medicine

## 2021-01-09 ENCOUNTER — Encounter (HOSPITAL_COMMUNITY): Payer: Self-pay

## 2021-01-09 DIAGNOSIS — M545 Low back pain, unspecified: Secondary | ICD-10-CM | POA: Insufficient documentation

## 2021-01-09 DIAGNOSIS — R200 Anesthesia of skin: Secondary | ICD-10-CM | POA: Insufficient documentation

## 2021-01-09 DIAGNOSIS — Z5321 Procedure and treatment not carried out due to patient leaving prior to being seen by health care provider: Secondary | ICD-10-CM | POA: Diagnosis not present

## 2021-01-09 NOTE — ED Triage Notes (Signed)
Pt to ED by POV from home with c/o R lower back/buttocks area radiating down the back of her leg, pt endorses numbness to affected area. Arrives A+O, VSS, NADN.

## 2021-02-01 DIAGNOSIS — E7849 Other hyperlipidemia: Secondary | ICD-10-CM | POA: Insufficient documentation

## 2021-03-05 ENCOUNTER — Other Ambulatory Visit: Payer: Self-pay

## 2021-03-05 ENCOUNTER — Emergency Department (HOSPITAL_COMMUNITY): Payer: Medicaid Other

## 2021-03-05 ENCOUNTER — Encounter (HOSPITAL_COMMUNITY): Payer: Self-pay

## 2021-03-05 ENCOUNTER — Emergency Department (HOSPITAL_COMMUNITY)
Admission: EM | Admit: 2021-03-05 | Discharge: 2021-03-05 | Disposition: A | Discharge status: Home / Self Care | Payer: Medicaid Other | Attending: Emergency Medicine | Admitting: Emergency Medicine

## 2021-03-05 DIAGNOSIS — N760 Acute vaginitis: Secondary | ICD-10-CM | POA: Diagnosis not present

## 2021-03-05 DIAGNOSIS — S3992XS Unspecified injury of lower back, sequela: Secondary | ICD-10-CM | POA: Diagnosis present

## 2021-03-05 DIAGNOSIS — S32059S Unspecified fracture of fifth lumbar vertebra, sequela: Secondary | ICD-10-CM | POA: Insufficient documentation

## 2021-03-05 DIAGNOSIS — Z7951 Long term (current) use of inhaled steroids: Secondary | ICD-10-CM | POA: Diagnosis not present

## 2021-03-05 DIAGNOSIS — B9689 Other specified bacterial agents as the cause of diseases classified elsewhere: Secondary | ICD-10-CM | POA: Insufficient documentation

## 2021-03-05 DIAGNOSIS — S32050S Wedge compression fracture of fifth lumbar vertebra, sequela: Secondary | ICD-10-CM

## 2021-03-05 DIAGNOSIS — R300 Dysuria: Secondary | ICD-10-CM

## 2021-03-05 DIAGNOSIS — J45909 Unspecified asthma, uncomplicated: Secondary | ICD-10-CM | POA: Diagnosis not present

## 2021-03-05 LAB — CBC
HCT: 30.1 % — ABNORMAL LOW (ref 36.0–46.0)
Hemoglobin: 8.6 g/dL — ABNORMAL LOW (ref 12.0–15.0)
MCH: 25.6 pg — ABNORMAL LOW (ref 26.0–34.0)
MCHC: 28.6 g/dL — ABNORMAL LOW (ref 30.0–36.0)
MCV: 89.6 fL (ref 80.0–100.0)
Platelets: 498 10*3/uL — ABNORMAL HIGH (ref 150–400)
RBC: 3.36 MIL/uL — ABNORMAL LOW (ref 3.87–5.11)
RDW: 19.9 % — ABNORMAL HIGH (ref 11.5–15.5)
WBC: 8.4 10*3/uL (ref 4.0–10.5)
nRBC: 0 % (ref 0.0–0.2)

## 2021-03-05 LAB — URINALYSIS, ROUTINE W REFLEX MICROSCOPIC
Bilirubin Urine: NEGATIVE
Glucose, UA: NEGATIVE mg/dL
Hgb urine dipstick: NEGATIVE
Ketones, ur: NEGATIVE mg/dL
Nitrite: NEGATIVE
Protein, ur: NEGATIVE mg/dL
Specific Gravity, Urine: 1.008 (ref 1.005–1.030)
pH: 6 (ref 5.0–8.0)

## 2021-03-05 LAB — BASIC METABOLIC PANEL
Anion gap: 10 (ref 5–15)
BUN: 9 mg/dL (ref 6–20)
CO2: 21 mmol/L — ABNORMAL LOW (ref 22–32)
Calcium: 8.6 mg/dL — ABNORMAL LOW (ref 8.9–10.3)
Chloride: 104 mmol/L (ref 98–111)
Creatinine, Ser: 0.52 mg/dL (ref 0.44–1.00)
GFR, Estimated: >60 mL/min (ref >60)
Glucose, Bld: 109 mg/dL — ABNORMAL HIGH (ref 70–99)
Potassium: 3.3 mmol/L — ABNORMAL LOW (ref 3.5–5.1)
Sodium: 135 mmol/L (ref 135–145)

## 2021-03-05 LAB — WET PREP, GENITAL
Sperm: NONE SEEN
Trich, Wet Prep: NONE SEEN
WBC, Wet Prep HPF POC: <10 (ref <10)
Yeast Wet Prep HPF POC: NONE SEEN

## 2021-03-05 MED ORDER — HYDROCODONE-ACETAMINOPHEN 5-325 MG PO TABS
1.0000 | ORAL_TABLET | Freq: Once | ORAL | Status: AC
  Administered 2021-03-05: 1 via ORAL
  Filled 2021-03-05: qty 1
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
## 2021-03-06 ENCOUNTER — Encounter (HOSPITAL_COMMUNITY): Payer: Self-pay | Admitting: *Deleted

## 2021-03-06 ENCOUNTER — Emergency Department (HOSPITAL_COMMUNITY)
Admission: EM | Admit: 2021-03-06 | Discharge: 2021-03-06 | Disposition: A | Discharge status: Home / Self Care | Payer: Medicaid Other | Attending: Emergency Medicine | Admitting: Emergency Medicine

## 2021-03-06 ENCOUNTER — Other Ambulatory Visit: Payer: Self-pay

## 2021-03-06 DIAGNOSIS — E876 Hypokalemia: Secondary | ICD-10-CM | POA: Insufficient documentation

## 2021-03-06 DIAGNOSIS — B9689 Other specified bacterial agents as the cause of diseases classified elsewhere: Secondary | ICD-10-CM | POA: Diagnosis not present

## 2021-03-06 DIAGNOSIS — S34109A Unspecified injury to unspecified level of lumbar spinal cord, initial encounter: Secondary | ICD-10-CM | POA: Diagnosis present

## 2021-03-06 DIAGNOSIS — X58XXXA Exposure to other specified factors, initial encounter: Secondary | ICD-10-CM | POA: Diagnosis not present

## 2021-03-06 DIAGNOSIS — S32050A Wedge compression fracture of fifth lumbar vertebra, initial encounter for closed fracture: Secondary | ICD-10-CM | POA: Insufficient documentation

## 2021-03-06 DIAGNOSIS — N760 Acute vaginitis: Secondary | ICD-10-CM | POA: Insufficient documentation

## 2021-03-06 LAB — GC/CHLAMYDIA PROBE AMP (~~LOC~~) NOT AT ARMC
Chlamydia: NEGATIVE
Comment: NEGATIVE
Comment: NORMAL
Neisseria Gonorrhea: NEGATIVE

## 2021-03-06 MED ORDER — METRONIDAZOLE 500 MG PO TABS: 500.0000 mg | ORAL_TABLET | Freq: Two times a day (BID) | ORAL | 0 refills | Status: DC

## 2021-03-06 NOTE — Discharge Instructions (Addendum)
Your test results from yesterday show that you likely have a bacterial vaginosis.  You have been prescribed antibiotics to take for this.  Please take as directed until they are finished.  Your results from yesterday also show that you have a compression fracture in your lower spine.  I have listed a neurosurgery group that you may contact to arrange follow-up.  Also Follow-up with your primary care provider for recheck if needed.   Return to the emergency department for any new or worsening symptoms.

## 2021-03-06 NOTE — ED Notes (Signed)
Pt given water per PA approval.

## 2021-03-06 NOTE — ED Provider Notes (Signed)
Amy Hall Provider Note   CSN: [id number] Arrival date & time: 03/06/21  2440     History  Chief Complaint  Patient presents with   Follow-up    Amy Hall is a 56 y.o. female.  HPI     Amy Hall is a 56 y.o. female who presents to the Emergency Department requesting a reevaluation for burning with urination.  She was seen here yesterday for same, she had to leave prior to her disposition as her ride had to go to work.  Today, she continues to have some irritation to her vaginal area and burning when she urinates.  She states that she tried to review her results in the MyChart app, but did not understand her results.  She is here to discuss.  Denies any new or worsening symptoms.    Home Medications Prior to Admission medications   Medication Sig Start Date End Date Taking? Authorizing Provider  Adalimumab 40 MG/0.4ML PNKT Inject into the skin. 06/04/17   [provider]  albuterol (PROVENTIL HFA;VENTOLIN HFA) 108 (90 Base) MCG/ACT inhaler Inhale 2 puffs into the lungs every 6 (six) hours as needed for wheezing or shortness of breath. 03/20/16   Charlott Rakes, MD  alendronate (FOSAMAX) 70 MG tablet Take 70 mg by mouth once a week. 09/14/16   [provider]  alendronate (FOSAMAX) 70 MG tablet TAKE 1 TABLET PER WEEK WITH FULL GLASS OF WATER ON EMPTY STOMACH & WAIT 30 MINUTES BEFORE EATING FOOD 09/26/20     b complex-C-folic acid 1 MG capsule Take 1 capsule (1 mg total) by mouth daily. 04/06/16   Rosita Fire, MD  cephALEXin (KEFLEX) 500 MG capsule Take 1 capsule (500 mg total) by mouth 2 (two) times daily. 10/13/20     cetirizine (ZYRTEC) 10 MG tablet Take 1 tablet (10 mg total) by mouth daily. Patient not taking: Reported on 12/17/2016 03/12/16   Charlott Rakes, MD  cyclobenzaprine (FLEXERIL) 5 MG tablet TAKE 1 TABLET BY MOUTH TWICE DAILY AS NEEDED FOR MUSCLE SPASMS 02/12/17   Charlott Rakes, MD  cyclobenzaprine (FLEXERIL) 5  MG tablet Take one tablet (5 mg dose) by mouth 2 (two) times a day as needed for Muscle spasms. 06/10/20     cyclobenzaprine (FLEXERIL) 5 MG tablet Take one tablet (5 mg dose) by mouth 2 (two) times a day as needed for Muscle spasms. 06/14/20     cyclobenzaprine (FLEXERIL) 5 MG tablet Take one tablet (5 mg dose) by mouth 2 (two) times a day as needed for Muscle spasms. 10/13/20     dextromethorphan-guaiFENesin (MUCINEX DM) 30-600 MG 12hr tablet Take 1 tablet by mouth 2 (two) times daily as needed for cough. Patient not taking: Reported on 12/17/2016 04/06/16   Rosita Fire, MD  DULoxetine (CYMBALTA) 30 MG capsule Take three capsules (90 mg dose) by mouth at bedtime. 10/13/20     DULoxetine (CYMBALTA) 60 MG capsule TAKE ONE CAPSULE (60 MG DOSE) BY MOUTH AT BEDTIME. 09/14/19 09/13/20  Ramos, Tracie Harrier C, NP-C  DULoxetine (CYMBALTA) 60 MG capsule Take one capsule (60 mg dose) by mouth at bedtime. 05/24/20     DULoxetine (CYMBALTA) 60 MG capsule Take one capsule (60 mg dose) by mouth at bedtime. 06/10/20     ferrous sulfate 325 (65 FE) MG tablet Take 1 tablet (325 mg total) by mouth 2 (two) times daily with a meal. 10/08/16   Janece Canterbury, MD  fluconazole (DIFLUCAN) 200 MG tablet Take 1 tablet (200 mg total)  by mouth daily. Patient not taking: Reported on 12/17/2016 10/09/16   Janece Canterbury, MD  hydrocortisone ointment 0.5 % Apply 1 application topically 2 (two) times daily. 09/25/17   Charlott Rakes, MD  hydrOXYzine (VISTARIL) 25 MG capsule Take one capsule (25 mg dose) by mouth 3 (three) times a day as needed. 10/13/20     methotrexate (RHEUMATREX) 2.5 MG tablet Take 15 mg by mouth once a week. 09/14/16   [provider]  omeprazole (PRILOSEC) 40 MG capsule Take 40 mg by mouth daily. 09/14/16   [provider]  pantoprazole (PROTONIX) 20 MG tablet TAKE ONE TABLET (20 MG DOSE) BY MOUTH DAILY. 04/14/20 04/14/21  Ulyses Jarred C, NP-C  pantoprazole (PROTONIX) 20 MG tablet Take one tablet  (20 mg dose) by mouth daily. 06/10/20     pantoprazole (PROTONIX) 20 MG tablet Take one tablet (20 mg dose) by mouth daily. 10/13/20     predniSONE (DELTASONE) 10 MG tablet TAKE 1 TABLET (10 MG TOTAL) BY MOUTH DAILY FOR 30 DAYS. 03/24/20 03/24/21  Mercie Eon, MD  predniSONE (DELTASONE) 10 MG tablet TAKE 1 TABLET (10 MG TOTAL) BY MOUTH DAILY FOR 30 DAYS. 11/10/19 11/09/20  Lanice Schwab, DO  predniSONE (DELTASONE) 10 MG tablet TAKE 3 TABLETS BY MOUTH FOR 2 DAYS, THEN 2 TABLETS FOR 2 DAYS, THEN 1 TABLET FOR 2 DAYS, THEN 1/2 TABLET FOR 2 DAYS THEN STOP. 11/10/19 11/09/20  Lanice Schwab, DO  predniSONE (DELTASONE) 10 MG tablet TAKE 1 TABLET BY MOUTH DAILY 10/23/19 10/22/20  Girard Cooter, MD  predniSONE (DELTASONE) 20 MG tablet Take 1 tablet (20 mg total) by mouth daily with breakfast. 10/08/16   Janece Canterbury, MD  predniSONE (DELTASONE) 5 MG tablet In 2 weeks, take 3 tabs daily x 2 weeks, then decrease to 2 tabs daily x 2 weeks, then 1 tab daily thereafter Patient not taking: Reported on 12/17/2016 10/08/16   Janece Canterbury, MD  predniSONE (DELTASONE) 5 MG tablet TAKE 1 AND 1/2 TABLET BY MOUTH DAILY 09/26/20     predniSONE (DELTASONE) 5 MG tablet Take 1.5 tablets (7.5 mg total) by mouth daily. 10/03/20         Allergies    Gabapentin    Review of Systems   Review of Systems  Respiratory:  Negative for shortness of breath.   Cardiovascular:  Negative for chest pain.  Gastrointestinal:  Negative for abdominal pain, nausea and vomiting.  Genitourinary:  Positive for dysuria. Negative for decreased urine volume, vaginal bleeding and vaginal discharge.       Irritation to her vaginal area  All other systems reviewed and are negative.  Physical Exam Updated Vital Signs BP 112/87 (BP Location: Left Arm)    Pulse 80    Temp 97.7 F (36.5 C) (Oral)    Resp 16    Ht 5\' 2"  (1.575 m)    Wt 57.6 kg    LMP 01/16/2015    SpO2 98%    BMI 23.23 kg/m  Physical Exam Vitals and nursing note  reviewed.  Constitutional:      General: She is not in acute distress.    Appearance: Normal appearance. She is not ill-appearing.  Cardiovascular:     Rate and Rhythm: Normal rate and regular rhythm.     Pulses: Normal pulses.  Pulmonary:     Effort: Pulmonary effort is normal.  Abdominal:     Palpations: Abdomen is soft.     Tenderness: There is no abdominal tenderness.  Musculoskeletal:  General: Normal range of motion.  Skin:    General: Skin is warm.     Capillary Refill: Capillary refill takes less than 2 seconds.     Findings: No rash.  Neurological:     General: No focal deficit present.     Mental Status: She is alert.     Sensory: No sensory deficit.     Motor: No weakness.    ED Results / Procedures / Treatments   Labs (all labs ordered are listed, but only abnormal results are displayed) Labs Reviewed - No data to display  EKG None  Radiology   Procedures Procedures    Medications Ordered in ED Medications - No data to display  ED Course/ Medical Decision Making/ A&P                           Medical Decision Making Amount and/or Complexity of Data Reviewed Labs:  Decision-making details documented in ED Course.    Details: Labs from yesterday show anemia with hemoglobin of 8.6.  This appears baseline.  Electrolytes show mild hypokalemia with potassium of 3.3.  Urinalysis without evidence of infection.   Wet prep shows some clue cells. I have discussed these results with the patient Radiology:  Decision-making details documented in ED Course.    Details: Had CT renal stone study performed yesterday.  Results show no evidence of renal stones or obstructive uropathy.  She has an L5 compression fracture involving the superior endplate that is new from 2017 but age-indeterminate.  Discussed results with the patient.  Risk Prescription drug management.   Patient here requesting reevaluation of her dysuria symptoms.  She was seen here yesterday for  same.  She had to leave prior to her reevaluation and discussion of her results. I have discussed and explained results in detail.  Patient denies any new or worsening symptoms.  I do not feel that additional imaging or labs are warranted at this time.  Patient verbalized understanding.  She appears appropriate for discharge home.  Will give follow-up information for neurosurgery regarding her compression fracture and prescription for metronidazole for her likely BV         Final Clinical Impression(s) / ED Diagnoses Final diagnoses:  Bacterial vaginosis  Compression fracture of L5 vertebra, initial encounter [facility])    Rx / [place] Orders ED Discharge Orders     None         Kem Parkinson, PA-C 03/09/21 1440    Davonna Belling, MD 03/09/21 1457

## 2021-03-06 NOTE — ED Triage Notes (Signed)
Pt reports she was seen last night for dysuria, tingling of her right leg/foot and back pain but had to leave prior to being discharged and having her results given to her because her ride had to leave to go to work. Pt reports she looked on her mychart account but doesn't understand her results.

## 2021-03-16 DIAGNOSIS — Z7952 Long term (current) use of systemic steroids: Secondary | ICD-10-CM | POA: Insufficient documentation

## 2021-03-16 DIAGNOSIS — M810 Age-related osteoporosis without current pathological fracture: Secondary | ICD-10-CM | POA: Insufficient documentation

## 2021-07-19 ENCOUNTER — Encounter (HOSPITAL_COMMUNITY): Payer: Self-pay

## 2021-07-19 ENCOUNTER — Other Ambulatory Visit: Payer: Self-pay

## 2021-07-19 ENCOUNTER — Emergency Department (HOSPITAL_COMMUNITY)
Admission: EM | Admit: 2021-07-19 | Discharge: 2021-07-20 | Disposition: A | Discharge status: Home / Self Care | Payer: Medicaid Other | Attending: Emergency Medicine | Admitting: Emergency Medicine

## 2021-07-19 ENCOUNTER — Emergency Department (HOSPITAL_COMMUNITY): Payer: Medicaid Other

## 2021-07-19 DIAGNOSIS — W1839XA Other fall on same level, initial encounter: Secondary | ICD-10-CM | POA: Insufficient documentation

## 2021-07-19 DIAGNOSIS — S8000XA Contusion of unspecified knee, initial encounter: Secondary | ICD-10-CM

## 2021-07-19 DIAGNOSIS — M25562 Pain in left knee: Secondary | ICD-10-CM | POA: Insufficient documentation

## 2021-07-19 NOTE — ED Provider Notes (Signed)
[facility] EMERGENCY DEPARTMENT Provider Note   CSN: [id number] Arrival date & time: 07/19/21  2201     History  Chief Complaint  Patient presents with   Lytle Michaels    Amy Hall is a 56 y.o. female.  Patient is a 56 year old female presenting with complaints of fall.  She has history of rheumatoid arthritis, anemia, degenerative disc disease, gastritis.  Patient tells me she has been off of her arthritis medications for the past month and has had difficulty getting around.  She reports falling today onto a bed frame and injuring her left knee.  She has had difficulty ambulating since.  She denies other injury.  The history is provided by the patient.       Home Medications Prior to Admission medications   Medication Sig Start Date End Date Taking? Authorizing Provider  Adalimumab 40 MG/0.4ML PNKT Inject into the skin. 06/04/17   [provider]  albuterol (PROVENTIL HFA;VENTOLIN HFA) 108 (90 Base) MCG/ACT inhaler Inhale 2 puffs into the lungs every 6 (six) hours as needed for wheezing or shortness of breath. 03/20/16   Charlott Rakes, MD  alendronate (FOSAMAX) 70 MG tablet Take 70 mg by mouth once a week. 09/14/16   [provider]  alendronate (FOSAMAX) 70 MG tablet TAKE 1 TABLET PER WEEK WITH FULL GLASS OF WATER ON EMPTY STOMACH & WAIT 30 MINUTES BEFORE EATING FOOD 09/26/20     b complex-C-folic acid 1 MG capsule Take 1 capsule (1 mg total) by mouth daily. 04/06/16   Rosita Fire, MD  cephALEXin (KEFLEX) 500 MG capsule Take 1 capsule (500 mg total) by mouth 2 (two) times daily. 10/13/20     cetirizine (ZYRTEC) 10 MG tablet Take 1 tablet (10 mg total) by mouth daily. Patient not taking: Reported on 12/17/2016 03/12/16   Charlott Rakes, MD  cyclobenzaprine (FLEXERIL) 5 MG tablet TAKE 1 TABLET BY MOUTH TWICE DAILY AS NEEDED FOR MUSCLE SPASMS 02/12/17   Charlott Rakes, MD  cyclobenzaprine (FLEXERIL) 5 MG tablet Take one tablet (5 mg dose) by mouth 2 (two) times a  day as needed for Muscle spasms. 06/10/20     cyclobenzaprine (FLEXERIL) 5 MG tablet Take one tablet (5 mg dose) by mouth 2 (two) times a day as needed for Muscle spasms. 06/14/20     cyclobenzaprine (FLEXERIL) 5 MG tablet Take one tablet (5 mg dose) by mouth 2 (two) times a day as needed for Muscle spasms. 10/13/20     dextromethorphan-guaiFENesin (MUCINEX DM) 30-600 MG 12hr tablet Take 1 tablet by mouth 2 (two) times daily as needed for cough. Patient not taking: Reported on 12/17/2016 04/06/16   Rosita Fire, MD  DULoxetine (CYMBALTA) 30 MG capsule Take three capsules (90 mg dose) by mouth at bedtime. 10/13/20     DULoxetine (CYMBALTA) 60 MG capsule TAKE ONE CAPSULE (60 MG DOSE) BY MOUTH AT BEDTIME. 09/14/19 09/13/20  Ramos, Tracie Harrier C, NP-C  DULoxetine (CYMBALTA) 60 MG capsule Take one capsule (60 mg dose) by mouth at bedtime. 05/24/20     DULoxetine (CYMBALTA) 60 MG capsule Take one capsule (60 mg dose) by mouth at bedtime. 06/10/20     ferrous sulfate 325 (65 FE) MG tablet Take 1 tablet (325 mg total) by mouth 2 (two) times daily with a meal. 10/08/16   Janece Canterbury, MD  fluconazole (DIFLUCAN) 200 MG tablet Take 1 tablet (200 mg total) by mouth daily. Patient not taking: Reported on 12/17/2016 10/09/16   Janece Canterbury, MD  hydrocortisone ointment  0.5 % Apply 1 application topically 2 (two) times daily. 09/25/17   Charlott Rakes, MD  hydrOXYzine (VISTARIL) 25 MG capsule Take one capsule (25 mg dose) by mouth 3 (three) times a day as needed. 10/13/20     methotrexate (RHEUMATREX) 2.5 MG tablet Take 15 mg by mouth once a week. 09/14/16   [provider]  metroNIDAZOLE (FLAGYL) 500 MG tablet Take 1 tablet (500 mg total) by mouth 2 (two) times daily. 03/06/21   Triplett, Tammy, PA-C  omeprazole (PRILOSEC) 40 MG capsule Take 40 mg by mouth daily. 09/14/16   [provider]  pantoprazole (PROTONIX) 20 MG tablet TAKE ONE TABLET (20 MG DOSE) BY MOUTH DAILY. 04/14/20 04/14/21  Ulyses Jarred C, NP-C  pantoprazole (PROTONIX) 20 MG tablet Take one tablet (20 mg dose) by mouth daily. 06/10/20     pantoprazole (PROTONIX) 20 MG tablet Take one tablet (20 mg dose) by mouth daily. 10/13/20     predniSONE (DELTASONE) 10 MG tablet TAKE 1 TABLET (10 MG TOTAL) BY MOUTH DAILY FOR 30 DAYS. 03/24/20 03/24/21  Mercie Eon, MD  predniSONE (DELTASONE) 10 MG tablet TAKE 1 TABLET (10 MG TOTAL) BY MOUTH DAILY FOR 30 DAYS. 11/10/19 11/09/20  Lanice Schwab, DO  predniSONE (DELTASONE) 10 MG tablet TAKE 3 TABLETS BY MOUTH FOR 2 DAYS, THEN 2 TABLETS FOR 2 DAYS, THEN 1 TABLET FOR 2 DAYS, THEN 1/2 TABLET FOR 2 DAYS THEN STOP. 11/10/19 11/09/20  Lanice Schwab, DO  predniSONE (DELTASONE) 10 MG tablet TAKE 1 TABLET BY MOUTH DAILY 10/23/19 10/22/20  Girard Cooter, MD  predniSONE (DELTASONE) 20 MG tablet Take 1 tablet (20 mg total) by mouth daily with breakfast. 10/08/16   Janece Canterbury, MD  predniSONE (DELTASONE) 5 MG tablet In 2 weeks, take 3 tabs daily x 2 weeks, then decrease to 2 tabs daily x 2 weeks, then 1 tab daily thereafter Patient not taking: Reported on 12/17/2016 10/08/16   Janece Canterbury, MD  predniSONE (DELTASONE) 5 MG tablet TAKE 1 AND 1/2 TABLET BY MOUTH DAILY 09/26/20     predniSONE (DELTASONE) 5 MG tablet Take 1.5 tablets (7.5 mg total) by mouth daily. 10/03/20         Allergies    Gabapentin    Review of Systems   Review of Systems  All other systems reviewed and are negative.   Physical Exam Updated Vital Signs BP 107/82 (BP Location: Right Arm)   Pulse (!) 104   Temp 98.4 F (36.9 C) (Oral)   Resp 16   Ht '5\' 2"'$  (1.575 m)   Wt 57.6 kg   LMP 01/16/2015   SpO2 97%   BMI 23.23 kg/m  Physical Exam Vitals and nursing note reviewed.  Constitutional:      Appearance: Normal appearance.  HENT:     Head: Normocephalic and atraumatic.  Pulmonary:     Effort: Pulmonary effort is normal.  Musculoskeletal:     Comments: The left knee shows changes consistent with  chronic arthritis.  There is no acute deformity noted.  She has pain with range of motion which limits exam somewhat, but there appears to be no varus or valgus laxity.  Anterior/posterior drawer test negative.  Skin:    General: Skin is warm and dry.  Neurological:     Mental Status: She is alert.     ED Results / Procedures / Treatments   Labs (all labs ordered are listed, but only abnormal results are displayed) Labs Reviewed - No data to  display  EKG None  Radiology No results found.  Procedures Procedures  {Document cardiac monitor, telemetry assessment procedure when appropriate:1}  Medications Ordered in ED Medications - No data to display  ED Course/ Medical Decision Making/ A&P                           Medical Decision Making Amount and/or Complexity of Data Reviewed Radiology: ordered.   ***  {Document critical care time when appropriate:1} {Document review of labs and clinical decision tools ie heart score, Chads2Vasc2 etc:1}  {Document your independent review of radiology images, and any outside records:1} {Document your discussion with family members, caretakers, and with consultants:1} {Document social determinants of health affecting pt's care:1} {Document your decision making why or why not admission, treatments were needed:1} Final Clinical Impression(s) / ED Diagnoses Final diagnoses:  None    Rx / DC Orders ED Discharge Orders     None

## 2021-07-19 NOTE — ED Triage Notes (Signed)
Pov form home. Cc of fall. Falls frequently. Co left knee and hip pain.happened Sunday morning.  Also states that her is out of prednisone and her other meds has not been to pcp.

## 2021-07-20 ENCOUNTER — Emergency Department (HOSPITAL_COMMUNITY): Payer: Medicaid Other

## 2021-07-20 MED ORDER — PREDNISONE 5 MG PO TABS: 5.0000 mg | ORAL_TABLET | Freq: Every day | ORAL | 1 refills | Status: DC

## 2021-07-20 MED ORDER — TRAMADOL HCL 50 MG PO TABS
50.0000 mg | ORAL_TABLET | Freq: Once | ORAL | Status: AC
  Administered 2021-07-20: 50 mg via ORAL
  Filled 2021-07-20: qty 1

## 2021-07-20 MED ORDER — TRAMADOL HCL 50 MG PO TABS
50.0000 mg | ORAL_TABLET | Freq: Four times a day (QID) | ORAL | 0 refills | Status: DC | PRN
Start: 2021-07-20 — End: 2021-09-01

## 2021-07-20 MED ORDER — PREDNISONE 10 MG PO TABS: ORAL_TABLET | ORAL | 0 refills | Status: DC

## 2021-07-20 NOTE — Discharge Instructions (Signed)
Wear Ace bandage for comfort and support.  Ice for 20 minutes every 2 hours while awake for the next 2 days.  Take tramadol as prescribed as needed for pain.  Begin taking prednisone taper as prescribed.  When you complete the taper, begin taking 5 mg daily thereafter.  Follow-up with your rheumatologist.

## 2021-09-01 ENCOUNTER — Other Ambulatory Visit: Payer: Self-pay

## 2021-09-01 ENCOUNTER — Emergency Department (HOSPITAL_COMMUNITY): Payer: Medicaid Other

## 2021-09-01 ENCOUNTER — Encounter (HOSPITAL_COMMUNITY): Payer: Self-pay | Admitting: Emergency Medicine

## 2021-09-01 ENCOUNTER — Emergency Department (HOSPITAL_COMMUNITY)
Admission: EM | Admit: 2021-09-01 | Discharge: 2021-09-01 | Disposition: A | Discharge status: Home / Self Care | Payer: Medicaid Other | Attending: Emergency Medicine | Admitting: Emergency Medicine

## 2021-09-01 DIAGNOSIS — H9201 Otalgia, right ear: Secondary | ICD-10-CM | POA: Diagnosis not present

## 2021-09-01 DIAGNOSIS — R531 Weakness: Secondary | ICD-10-CM | POA: Insufficient documentation

## 2021-09-01 DIAGNOSIS — D72829 Elevated white blood cell count, unspecified: Secondary | ICD-10-CM | POA: Insufficient documentation

## 2021-09-01 DIAGNOSIS — J029 Acute pharyngitis, unspecified: Secondary | ICD-10-CM | POA: Insufficient documentation

## 2021-09-01 DIAGNOSIS — D649 Anemia, unspecified: Secondary | ICD-10-CM | POA: Insufficient documentation

## 2021-09-01 LAB — CBC WITH DIFFERENTIAL/PLATELET
Abs Immature Granulocytes: 0.31 10*3/uL — ABNORMAL HIGH (ref 0.00–0.07)
Basophils Absolute: 0.1 10*3/uL (ref 0.0–0.1)
Basophils Relative: 1 %
Eosinophils Absolute: 0.2 10*3/uL (ref 0.0–0.5)
Eosinophils Relative: 2 %
HCT: 36.3 % (ref 36.0–46.0)
Hemoglobin: 10.4 g/dL — ABNORMAL LOW (ref 12.0–15.0)
Immature Granulocytes: 3 %
Lymphocytes Relative: 19 %
Lymphs Abs: 2.4 10*3/uL (ref 0.7–4.0)
MCH: 22.8 pg — ABNORMAL LOW (ref 26.0–34.0)
MCHC: 28.7 g/dL — ABNORMAL LOW (ref 30.0–36.0)
MCV: 79.6 fL — ABNORMAL LOW (ref 80.0–100.0)
Monocytes Absolute: 1.1 10*3/uL — ABNORMAL HIGH (ref 0.1–1.0)
Monocytes Relative: 9 %
Neutro Abs: 8.5 10*3/uL — ABNORMAL HIGH (ref 1.7–7.7)
Neutrophils Relative %: 66 %
Platelets: 578 10*3/uL — ABNORMAL HIGH (ref 150–400)
RBC: 4.56 MIL/uL (ref 3.87–5.11)
RDW: 22.3 % — ABNORMAL HIGH (ref 11.5–15.5)
WBC: 12.6 10*3/uL — ABNORMAL HIGH (ref 4.0–10.5)
nRBC: 0 % (ref 0.0–0.2)

## 2021-09-01 LAB — BASIC METABOLIC PANEL
Anion gap: 10 (ref 5–15)
BUN: 13 mg/dL (ref 6–20)
CO2: 24 mmol/L (ref 22–32)
Calcium: 8.8 mg/dL — ABNORMAL LOW (ref 8.9–10.3)
Chloride: 105 mmol/L (ref 98–111)
Creatinine, Ser: 0.53 mg/dL (ref 0.44–1.00)
GFR, Estimated: >60 mL/min (ref >60)
Glucose, Bld: 120 mg/dL — ABNORMAL HIGH (ref 70–99)
Potassium: 3.6 mmol/L (ref 3.5–5.1)
Sodium: 139 mmol/L (ref 135–145)

## 2021-09-01 LAB — GROUP A STREP BY PCR: Group A Strep by PCR: NOT DETECTED

## 2021-09-01 MED ORDER — LIDOCAINE VISCOUS HCL 2 % MT SOLN
15.0000 mL | Freq: Once | OROMUCOSAL | Status: AC
  Administered 2021-09-01: 15 mL via OROMUCOSAL
  Filled 2021-09-01: qty 15

## 2021-09-01 MED ORDER — LIDOCAINE VISCOUS HCL 2 % MT SOLN: 15.0000 mL | OROMUCOSAL | 0 refills | Status: AC | PRN

## 2021-09-01 NOTE — ED Provider Notes (Signed)
[facility] EMERGENCY DEPARTMENT Provider Note   CSN: [id number] Arrival date & time: 09/01/21  1738     History Chief Complaint  Patient presents with   Sore Throat    Amy Hall is a 56 y.o. female patient who presents to the emergency department with a sore throat and generalized weakness that is been ongoing for a week.  Patient was seen evaluated at [facility].  Care everywhere reveals that she was seen there and found to be anemic.  She was given 2 units of blood.  Strep was negative at that time.  Chest x-ray did not reveal any signs of pneumonia.  No evidence of GI bleeding.  Patient still having persistent sore throat.  She is also endorsing right ear pain which was present at her visit with [facility] as well.   Sore Throat       Home Medications Prior to Admission medications   Medication Sig Start Date End Date Taking? Authorizing Provider  albuterol (PROVENTIL HFA;VENTOLIN HFA) 108 (90 Base) MCG/ACT inhaler Inhale 2 puffs into the lungs every 6 (six) hours as needed for wheezing or shortness of breath. 03/20/16  Yes Charlott Rakes, MD  cephALEXin (KEFLEX) 500 MG capsule Take 1 capsule (500 mg total) by mouth 2 (two) times daily. 10/13/20  Yes   DULoxetine (CYMBALTA) 30 MG capsule Take three capsules (90 mg dose) by mouth at bedtime. 10/13/20  Yes   ferrous gluconate (FERGON) 324 MG tablet Take 324 mg by mouth 2 (two) times daily. 04/26/21  Yes [provider]  folic acid (FOLVITE) 1 MG tablet Take by mouth. 12/26/20 12/26/21 Yes [provider]  lidocaine (XYLOCAINE) 2 % solution Use as directed 15 mLs in the mouth or throat as needed for mouth pain. 09/01/21  Yes Raul Del, Tiarrah Saville M, PA-C  omeprazole (PRILOSEC) 40 MG capsule Take 40 mg by mouth daily. 09/14/16  Yes [provider]  pantoprazole (PROTONIX) 20 MG tablet Take one tablet (20 mg dose) by mouth daily. 06/10/20  Yes   predniSONE (DELTASONE) 5 MG tablet Take 1 tablet (5 mg total) by mouth daily.  07/20/21  Yes Delo, Nathaneil Canary, MD  Upadacitinib ER (RINVOQ) 15 MG TB24 Take 15 mg by mouth daily. 02/18/20  Yes [provider]  hydrocortisone ointment 0.5 % Apply 1 application topically 2 (two) times daily. 09/25/17   Charlott Rakes, MD  hydrOXYzine (VISTARIL) 25 MG capsule Take one capsule (25 mg dose) by mouth 3 (three) times a day as needed. Patient not taking: Reported on 09/01/2021 10/13/20     pantoprazole (PROTONIX) 20 MG tablet TAKE ONE TABLET (20 MG DOSE) BY MOUTH DAILY. 04/14/20 04/14/21  Adalberto Cole, NP-C      Allergies    Gabapentin    Review of Systems   Review of Systems  All other systems reviewed and are negative.   Physical Exam Updated Vital Signs BP 138/89   Pulse 98   Temp 98 F (36.7 C) (Oral)   Resp 18   Ht '5\' 2"'$  (1.575 m)   Wt 54.4 kg   LMP 01/16/2015   SpO2 99%   BMI 21.95 kg/m  Physical Exam Vitals and nursing note reviewed.  Constitutional:      General: She is not in acute distress.    Appearance: Normal appearance.  HENT:     Head: Normocephalic and atraumatic.     Right Ear: Tympanic membrane, ear canal and external ear normal.     Left Ear: Tympanic membrane, ear canal  and external ear normal.     Mouth/Throat:     Mouth: Mucous membranes are moist.     Pharynx: Oropharynx is clear. Uvula midline. No pharyngeal swelling, oropharyngeal exudate, posterior oropharyngeal erythema or uvula swelling.     Tonsils: No tonsillar exudate or tonsillar abscesses.  Eyes:     General:        Right eye: No discharge.        Left eye: No discharge.  Cardiovascular:     Comments: Regular rate and rhythm.  S1/S2 are distinct without any evidence of murmur, rubs, or gallops.  Radial pulses are 2+ bilaterally.  Dorsalis pedis pulses are 2+ bilaterally.  No evidence of pedal edema. Pulmonary:     Comments: Clear to auscultation bilaterally.  Normal effort.  No respiratory distress.  No evidence of wheezes, rales, or rhonchi heard  throughout. Abdominal:     General: Abdomen is flat. Bowel sounds are normal. There is no distension.     Tenderness: There is no abdominal tenderness. There is no guarding or rebound.  Musculoskeletal:        General: Normal range of motion.     Cervical back: Neck supple.  Skin:    General: Skin is warm and dry.     Findings: No rash.  Neurological:     General: No focal deficit present.     Mental Status: She is alert.  Psychiatric:        Mood and Affect: Mood normal.        Behavior: Behavior normal.     ED Results / Procedures / Treatments   Labs (all labs ordered are listed, but only abnormal results are displayed) Labs Reviewed  CBC WITH DIFFERENTIAL/PLATELET - Abnormal; Notable for the following components:      Result Value   WBC 12.6 (*)    Hemoglobin 10.4 (*)    MCV 79.6 (*)    MCH 22.8 (*)    MCHC 28.7 (*)    RDW 22.3 (*)    Platelets 578 (*)    Neutro Abs 8.5 (*)    Monocytes Absolute 1.1 (*)    Abs Immature Granulocytes 0.31 (*)    All other components within normal limits  BASIC METABOLIC PANEL - Abnormal; Notable for the following components:   Glucose, Bld 120 (*)    Calcium 8.8 (*)    All other components within normal limits  GROUP A STREP BY PCR    EKG None  Radiology DG Chest 2 View  Result Date: 09/01/2021 CLINICAL DATA:  Right earache and sore throat x1 week. EXAM: CHEST - 2 VIEW COMPARISON:  October 06, 2016 FINDINGS: The heart size and mediastinal contours are within normal limits. Diffuse, chronic appearing increased interstitial lung markings are seen. There is no evidence of acute infiltrate, pleural effusion or pneumothorax. There is a stable, small to moderate sized hiatal hernia. A chronic compression fracture deformities again seen at the level of L1. IMPRESSION: Chronic appearing increased interstitial lung markings without acute or active cardiopulmonary disease. Electronically Signed   By: Virgina Norfolk M.D.   On: 09/01/2021  18:41    Procedures Procedures    Medications Ordered in ED Medications  lidocaine (XYLOCAINE) 2 % viscous mouth solution 15 mL (15 mLs Mouth/Throat Given 09/01/21 1921)    ED Course/ Medical Decision Making/ A&P Clinical Course as of 09/01/21 1936  Fri Sep 01, 2021  1935 CBC with Differential(!) There is evidence of leukocytosis and anemia but improved from  previous result on Care Everywhere.  Patient does take prednisone daily which could explain this leukocytosis. [CF]  6546 Basic metabolic panel(!) Normal. [CF]  1935 Group A Strep by PCR Normal. [CF]    Clinical Course User Index [CF] Hendricks Limes, PA-C                           Medical Decision Making Amy Hall is a 56 y.o. female patient who presents to the emergency department today for further evaluation of sore throat and right ear pain.  I do not see any evidence of otitis media or otitis externa.  Ear canal is normal bilaterally.  Given that she was given 2 units of blood recently I will get some basic labs to reevaluate her hemoglobin status.  We will also get a repeat strep to see if anything is changed.  Chest x-ray to evaluate for pneumonia.  I will plan to reassess.  Patient feeling somewhat better after Xylocaine Viscous solution.  I went over all labs and imaging with her at the bedside.  This is likely viral pharyngitis.  Should resolve on its own.  I have a low suspicion for PTA or RPA at this time.  Strict return precautions discussed.  She will follow-up with her primary care doctor for further evaluation next week.   Amount and/or Complexity of Data Reviewed Labs: ordered. Radiology: ordered.  Risk Prescription drug management.   Final Clinical Impression(s) / ED Diagnoses Final diagnoses:  Pharyngitis, unspecified etiology    Rx / DC Orders ED Discharge Orders          Ordered    lidocaine (XYLOCAINE) 2 % solution  As needed        09/01/21 1931              Cherrie Gauze 09/01/21 1936    Milton Ferguson, MD 09/02/21 1018

## 2021-09-01 NOTE — Discharge Instructions (Signed)
You may use Xylocaine Viscous solution to help with your throat.  Please drink plenty of fluids.  You can also use warm salt water gargles in addition to hot teas.  This should resolve on its own.  I would like for you to follow-up with your primary care doctor next week for further evaluation.  Return to the emergency room for any worsening symptoms.

## 2021-09-01 NOTE — ED Triage Notes (Signed)
Pt presents with right ear ache and sore throat x 1 week.

## 2021-10-22 ENCOUNTER — Encounter (HOSPITAL_COMMUNITY): Payer: Self-pay | Admitting: *Deleted

## 2021-10-22 ENCOUNTER — Emergency Department (HOSPITAL_COMMUNITY)
Admission: EM | Admit: 2021-10-22 | Discharge: 2021-10-22 | Disposition: A | Discharge status: Home / Self Care | Payer: Medicaid Other | Attending: Emergency Medicine | Admitting: Emergency Medicine

## 2021-10-22 ENCOUNTER — Other Ambulatory Visit: Payer: Self-pay

## 2021-10-22 ENCOUNTER — Emergency Department (HOSPITAL_COMMUNITY): Payer: Medicaid Other

## 2021-10-22 DIAGNOSIS — M170 Bilateral primary osteoarthritis of knee: Secondary | ICD-10-CM

## 2021-10-22 DIAGNOSIS — M2540 Effusion, unspecified joint: Secondary | ICD-10-CM

## 2021-10-22 DIAGNOSIS — M25561 Pain in right knee: Secondary | ICD-10-CM | POA: Diagnosis present

## 2021-10-22 MED ORDER — OXYCODONE HCL 5 MG PO TABS: 5.0000 mg | ORAL_TABLET | Freq: Four times a day (QID) | ORAL | 0 refills | Status: AC | PRN

## 2021-10-22 MED ORDER — OXYCODONE HCL 5 MG PO TABS
10.0000 mg | ORAL_TABLET | Freq: Once | ORAL | Status: AC
  Administered 2021-10-22: 10 mg via ORAL
  Filled 2021-10-22: qty 2

## 2021-10-22 NOTE — ED Triage Notes (Signed)
Pt c/o bilateral knee pain and weakness x 1 week. Denies known injury.

## 2021-10-22 NOTE — Discharge Instructions (Signed)
Follow up this week with your orthopedic surgeon regarding knee pain and joint effusion. Rest, elevate leg, ice, and pain medication as needed.

## 2021-10-22 NOTE — ED Provider Notes (Signed)
[facility] EMERGENCY DEPARTMENT Provider Note   CSN: [id number] Arrival date & time: 10/22/21  1000     History  Chief Complaint  Patient presents with   Leg Pain    Amy Hall is a 56 y.o. female.  Patient is a 56 year old female with past medical history of osteoarthritis in bilateral knees presenting for bilateral knee pain.  Patient admits to worsening pain in the left knee as compared to the right knee however states they are both severe resulting in difficulty ambulating due to pain.  Pain has been present for the past 1 to 2 weeks.  Denies any falls or injuries.  States she was scheduled for a knee replacement surgery for the left knee at the end of this month however it got pushed back due to her chronic anemia and low iron levels.  Patient denies any swelling, warmth, redness, or wounds.  The history is provided by the patient. No language interpreter was used.  Leg Pain Associated symptoms: no back pain and no fever        Home Medications Prior to Admission medications   Medication Sig Start Date End Date Taking? Authorizing Provider  albuterol (PROVENTIL HFA;VENTOLIN HFA) 108 (90 Base) MCG/ACT inhaler Inhale 2 puffs into the lungs every 6 (six) hours as needed for wheezing or shortness of breath. 03/20/16   Charlott Rakes, MD  cephALEXin (KEFLEX) 500 MG capsule Take 1 capsule (500 mg total) by mouth 2 (two) times daily. 10/13/20     DULoxetine (CYMBALTA) 30 MG capsule Take three capsules (90 mg dose) by mouth at bedtime. 10/13/20     ferrous gluconate (FERGON) 324 MG tablet Take 324 mg by mouth 2 (two) times daily. 04/26/21   [provider]  folic acid (FOLVITE) 1 MG tablet Take by mouth. 12/26/20 12/26/21  [provider]  hydrocortisone ointment 0.5 % Apply 1 application topically 2 (two) times daily. 09/25/17   Charlott Rakes, MD  hydrOXYzine (VISTARIL) 25 MG capsule Take one capsule (25 mg dose) by mouth 3 (three) times a day as needed. Patient  not taking: Reported on 09/01/2021 10/13/20     lidocaine (XYLOCAINE) 2 % solution Use as directed 15 mLs in the mouth or throat as needed for mouth pain. 09/01/21   Myna Bright M, PA-C  omeprazole (PRILOSEC) 40 MG capsule Take 40 mg by mouth daily. 09/14/16   [provider]  pantoprazole (PROTONIX) 20 MG tablet TAKE ONE TABLET (20 MG DOSE) BY MOUTH DAILY. 04/14/20 04/14/21  Ulyses Jarred C, NP-C  pantoprazole (PROTONIX) 20 MG tablet Take one tablet (20 mg dose) by mouth daily. 06/10/20     predniSONE (DELTASONE) 5 MG tablet Take 1 tablet (5 mg total) by mouth daily. 07/20/21   Veryl Speak, MD  Upadacitinib ER (RINVOQ) 15 MG TB24 Take 15 mg by mouth daily. 02/18/20   [provider]      Allergies    Gabapentin    Review of Systems   Review of Systems  Constitutional:  Negative for chills and fever.  HENT:  Negative for ear pain and sore throat.   Eyes:  Negative for pain and visual disturbance.  Respiratory:  Negative for cough and shortness of breath.   Cardiovascular:  Negative for chest pain and palpitations.  Gastrointestinal:  Negative for abdominal pain and vomiting.  Genitourinary:  Negative for dysuria and hematuria.  Musculoskeletal:  Negative for arthralgias and back pain.  Skin:  Negative for color change and rash.  Neurological:  Negative for seizures and syncope.  All other systems reviewed and are negative.   Physical Exam Updated Vital Signs BP 134/89   Pulse 94   Temp 98.2 F (36.8 C) (Oral)   Resp 19   Ht '4\' 11"'$  (1.499 m)   Wt 52.2 kg   LMP 01/16/2015   SpO2 100%   BMI 23.23 kg/m  Physical Exam Vitals and nursing note reviewed.  Constitutional:      General: She is not in acute distress.    Appearance: She is well-developed.  HENT:     Head: Normocephalic and atraumatic.  Eyes:     Conjunctiva/sclera: Conjunctivae normal.  Cardiovascular:     Rate and Rhythm: Normal rate and regular rhythm.     Pulses:          Dorsalis pedis pulses  are 2+ on the right side and 2+ on the left side.     Heart sounds: No murmur heard. Pulmonary:     Effort: Pulmonary effort is normal. No respiratory distress.     Breath sounds: Normal breath sounds.  Abdominal:     Palpations: Abdomen is soft.     Tenderness: There is no abdominal tenderness.  Musculoskeletal:        General: No swelling.     Cervical back: Neck supple.     Right knee: Bony tenderness present. No swelling, deformity or erythema. Normal range of motion.     Left knee: Bony tenderness present. No swelling, deformity or erythema. Normal range of motion.  Skin:    General: Skin is warm and dry.     Capillary Refill: Capillary refill takes less than 2 seconds.  Neurological:     Mental Status: She is alert.     Sensory: Sensation is intact.     Motor: Motor function is intact.  Psychiatric:        Mood and Affect: Mood normal.     ED Results / Procedures / Treatments   Labs (all labs ordered are listed, but only abnormal results are displayed) Labs Reviewed - No data to display  EKG None  Radiology DG Knee 2 Views Right  Result Date: 10/22/2021 CLINICAL DATA:  Bilateral knee pain and weakness for 1 week. No injury. EXAM: RIGHT KNEE - 1-2 VIEW COMPARISON:  None Available. FINDINGS: No fracture.  No bone lesion. Moderate to marked lateral compartment narrowing. Minor marginal osteophytes from the medial and lateral compartments. Moderate joint effusion. Soft tissues are unremarkable. IMPRESSION: 1. No fracture. 2. Moderate degenerative/arthropathic changes predominantly involving the lateral compartment, associated with a moderate joint effusion. Changes are less severe than that on the left. Electronically Signed   By: Lajean Manes M.D.   On: 10/22/2021 11:06   DG Knee 2 Views Left  Result Date: 10/22/2021 CLINICAL DATA:  Bilateral knee pain and weakness.  No injury. EXAM: LEFT KNEE - 1-2 VIEW COMPARISON:  07/19/2021. FINDINGS: No fracture or bone lesion. Marked  lateral joint space compartment narrowing with mild medial compartment narrowing. Small marginal osteophytes from all 3 compartments, most evident laterally. Mild lateral compartment subchondral sclerosis. Moderate joint effusion. Focus of sclerosis in the distal femur at the metadiaphysis consistent with healed cartilages lesion, unchanged. IMPRESSION: 1. No fracture or acute finding. 2. Advanced degenerative/arthropathic changes predominantly involving the lateral compartment, associated with a moderate joint effusion, similar to the prior radiographs. Electronically Signed   By: Lajean Manes M.D.   On: 10/22/2021 11:05    Procedures Procedures    Medications  Ordered in ED Medications  oxyCODONE (Oxy IR/ROXICODONE) immediate release tablet 10 mg (10 mg Oral Given 10/22/21 1046)    ED Course/ Medical Decision Making/ A&P                           Medical Decision Making Amount and/or Complexity of Data Reviewed Radiology: ordered.  Risk Prescription drug management.   Marland Kitchennwo 56 year old female with past medical history of osteoarthritis in bilateral knees presenting for bilateral knee pain.  She is alert and oriented x3, no acute distress, afebrile, vital signs.  Physical Sam demonstrates tenderness to palpation of the bilateral knees.  No erythema, warmth, swelling, wounds.  No evidence of acute trauma.  No cellulitis.  No concerns for septic joints.  X-rays demonstrate: 1. No fracture. 2. Moderate degenerative/arthropathic changes predominantly involving the lateral compartment, associated with a moderate joint effusion. Changes are less severe than that on the left.    Pt recommended for close follow up with established orthopedic surgeon, rest, ice, elevation, and medication for pain control.   Patient in no distress and overall condition improved here in the ED. Detailed discussions were had with the patient regarding current findings, and need for close f/u with PCP or on call  doctor. The patient has been instructed to return immediately if the symptoms worsen in any way for re-evaluation. Patient verbalized understanding and is in agreement with current care plan. All questions answered prior to discharge.         Final Clinical Impression(s) / ED Diagnoses Final diagnoses:  Osteoarthritis of both knees, unspecified osteoarthritis type  Effusion of joint, unspecified location    Rx / DC Orders ED Discharge Orders     None         Lianne Cure, DO 97/67/34 1200

## 2021-10-25 ENCOUNTER — Emergency Department (HOSPITAL_COMMUNITY)
Admission: EM | Admit: 2021-10-25 | Discharge: 2021-10-25 | Disposition: A | Discharge status: Home / Self Care | Payer: Medicaid Other | Attending: Emergency Medicine | Admitting: Emergency Medicine

## 2021-10-25 ENCOUNTER — Encounter (HOSPITAL_COMMUNITY): Payer: Self-pay | Admitting: Emergency Medicine

## 2021-10-25 ENCOUNTER — Other Ambulatory Visit: Payer: Self-pay

## 2021-10-25 DIAGNOSIS — M130 Polyarthritis, unspecified: Secondary | ICD-10-CM | POA: Insufficient documentation

## 2021-10-25 DIAGNOSIS — J45909 Unspecified asthma, uncomplicated: Secondary | ICD-10-CM | POA: Diagnosis not present

## 2021-10-25 DIAGNOSIS — M25561 Pain in right knee: Secondary | ICD-10-CM | POA: Diagnosis present

## 2021-10-25 MED ORDER — PREDNISONE 10 MG PO TABS: 20.0000 mg | ORAL_TABLET | Freq: Every day | ORAL | 0 refills | Status: DC

## 2021-10-25 MED ORDER — PREDNISONE 20 MG PO TABS
40.0000 mg | ORAL_TABLET | Freq: Once | ORAL | Status: AC
  Administered 2021-10-25: 40 mg via ORAL
  Filled 2021-10-25: qty 2

## 2021-10-25 MED ORDER — KETOROLAC TROMETHAMINE 60 MG/2ML IM SOLN
30.0000 mg | Freq: Once | INTRAMUSCULAR | Status: AC
  Administered 2021-10-25: 30 mg via INTRAMUSCULAR
  Filled 2021-10-25: qty 2
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
## 2021-10-25 NOTE — Discharge Instructions (Signed)
A prescription for stress dose prednisone was sent to your pharmacy.  Take this as prescribed for the neck 7 days.  Keep your follow-up appointment with your rheumatologist for ongoing management of your chronic condition.  Return to emergency department for any new or worsening symptoms of concern.

## 2021-11-26 ENCOUNTER — Emergency Department (HOSPITAL_COMMUNITY)
Admission: EM | Admit: 2021-11-26 | Discharge: 2021-11-26 | Disposition: A | Discharge status: Home / Self Care | Payer: Medicaid Other | Attending: Emergency Medicine | Admitting: Emergency Medicine

## 2021-11-26 ENCOUNTER — Other Ambulatory Visit: Payer: Self-pay

## 2021-11-26 ENCOUNTER — Encounter (HOSPITAL_COMMUNITY): Payer: Self-pay | Admitting: Emergency Medicine

## 2021-11-26 DIAGNOSIS — J45909 Unspecified asthma, uncomplicated: Secondary | ICD-10-CM | POA: Insufficient documentation

## 2021-11-26 DIAGNOSIS — M069 Rheumatoid arthritis, unspecified: Secondary | ICD-10-CM | POA: Diagnosis not present

## 2021-11-26 DIAGNOSIS — Z79899 Other long term (current) drug therapy: Secondary | ICD-10-CM | POA: Insufficient documentation

## 2021-11-26 DIAGNOSIS — M25561 Pain in right knee: Secondary | ICD-10-CM | POA: Diagnosis present

## 2021-11-26 MED ORDER — KETOROLAC TROMETHAMINE 15 MG/ML IJ SOLN
15.0000 mg | Freq: Once | INTRAMUSCULAR | Status: AC
  Administered 2021-11-26: 15 mg via INTRAMUSCULAR
  Filled 2021-11-26: qty 1

## 2021-11-26 MED ORDER — PREDNISONE 10 MG PO TABS: 10.0000 mg | ORAL_TABLET | Freq: Every day | ORAL | 0 refills | Status: DC

## 2021-11-26 MED ORDER — HYDROCODONE-ACETAMINOPHEN 5-325 MG PO TABS
1.0000 | ORAL_TABLET | Freq: Once | ORAL | Status: AC
  Administered 2021-11-26: 1 via ORAL
  Filled 2021-11-26: qty 1

## 2021-11-26 NOTE — ED Provider Notes (Signed)
[facility] EMERGENCY DEPARTMENT Provider Note   CSN: [id number] Arrival date & time: 11/26/21  7782     History  Chief Complaint  Patient presents with   Joint Pain    Amy Hall is a 56 y.o. female with rheumatoid arthritis, anemia, asthma, B12 deficiency, IDA, history of gastritis, GI bleed, symptomatic anemia, long-term steroid user, tobacco abuse presents with joint pains.   Patient presents with bilateral knee ankle and foot pain that began over the last couple of days.  Patient states that she ran out of her prednisone and this is consistent with her normal rheumatoid arthritis flare.  She denies any fevers but states she had chills last week, not since then.  Denies any other symptoms including chest pain, shortness of breath, abdominal pain, urinary symptoms.  Joints have been a little bit swollen but not erythematous.  She has not had any trauma.  Patient states that she is supposed to see her rheumatologist on Wednesday but the pain kept her up all night last night so she came to the emergency department today.  Per chart review, patient was seen on 10/22/2021 and 10/25/2021 also for joint pains. She has a history of rheumatoid arthritis and is followed by [facility] rheumatology.  She has been on chronic steroid therapy for years.  She has trialed multiple Biologics in the past without improvement in symptoms. Per chart review, plan from her rheumatologist for RA flares is a 20 mg dose of prednisone daily for 7 days.    On 11/07/2021 patient noted a diffuse rash and itching after starting Actemra.  Per rheumatology note from 11/05/2021: "ED wishing for assistance on prednisone therapy. We recommended a longer taper to carry her through to her next appointment with Dr. Manuella Ghazi on 11/29/2021. Prednisone 20 mg for 7 days, 15 for 7 days, 10 for 7 days, and 5 for 7 days."   HPI     Home Medications Prior to Admission medications   Medication Sig Start Date End Date Taking?  Authorizing Provider  albuterol (PROVENTIL HFA;VENTOLIN HFA) 108 (90 Base) MCG/ACT inhaler Inhale 2 puffs into the lungs every 6 (six) hours as needed for wheezing or shortness of breath. 03/20/16   Charlott Rakes, MD  cephALEXin (KEFLEX) 500 MG capsule Take 1 capsule (500 mg total) by mouth 2 (two) times daily. 10/13/20     DULoxetine (CYMBALTA) 30 MG capsule Take three capsules (90 mg dose) by mouth at bedtime. 10/13/20     ferrous gluconate (FERGON) 324 MG tablet Take 324 mg by mouth 2 (two) times daily. 04/26/21   [provider]  folic acid (FOLVITE) 1 MG tablet Take by mouth. 12/26/20 12/26/21  [provider]  hydrocortisone ointment 0.5 % Apply 1 application topically 2 (two) times daily. 09/25/17   Charlott Rakes, MD  hydrOXYzine (VISTARIL) 25 MG capsule Take one capsule (25 mg dose) by mouth 3 (three) times a day as needed. Patient not taking: Reported on 09/01/2021 10/13/20     lidocaine (XYLOCAINE) 2 % solution Use as directed 15 mLs in the mouth or throat as needed for mouth pain. 09/01/21   Myna Bright M, PA-C  omeprazole (PRILOSEC) 40 MG capsule Take 40 mg by mouth daily. 09/14/16   [provider]  pantoprazole (PROTONIX) 20 MG tablet TAKE ONE TABLET (20 MG DOSE) BY MOUTH DAILY. 04/14/20 04/14/21  Ulyses Jarred C, NP-C  pantoprazole (PROTONIX) 20 MG tablet Take one tablet (20 mg dose) by mouth daily. 06/10/20  predniSONE (DELTASONE) 10 MG tablet Take 2 tablets (20 mg total) by mouth daily for 7 days. 10/25/21 11/01/21  Godfrey Pick, MD  predniSONE (DELTASONE) 5 MG tablet Take 1 tablet (5 mg total) by mouth daily. 07/20/21   Veryl Speak, MD  Upadacitinib ER (RINVOQ) 15 MG TB24 Take 15 mg by mouth daily. 02/18/20   [provider]      Allergies    Gabapentin    Review of Systems   Review of Systems Review of systems negative for fevers.  A 10 point review of systems was performed and is negative unless otherwise reported in HPI.  Physical  Exam Updated Vital Signs BP (!) 146/93 (BP Location: Right Arm)   Pulse 78   Temp 98.1 F (36.7 C) (Oral)   Resp 18   LMP 01/16/2015   SpO2 100%  Physical Exam General: Normal appearing female, lying in bed.  HEENT: Sclera anicteric, MMM, trachea midline. Cardiology: RRR, no murmurs/rubs/gallops. BL radial and DP pulses equal bilaterally.  Resp: Normal respiratory rate and effort. CTAB, no wheezes, rhonchi, crackles.  Abd: Soft, non-tender, non-distended. No rebound tenderness or guarding.  GU: Deferred. MSK: Ulnar deviation of BL MCPs.  BL knees w/ effusions, R>L. No erythema to knees, they are symmetrically warm to touch. Diffuse TTP of BL Knees and ankles. No edema of the ankles or feet. Lateral deviation of toes bilaterally. FROM intact with some pain with active and passive movement. Intact DP/PT pulses. Intact sensation.  Skin: warm, dry. No rashes or lesions. Back: No CVA tenderness Neuro: A&Ox4, CNs II-XII grossly intact. MAEs. Sensation grossly intact.  Psych: Normal mood and affect.   ED Results / Procedures / Treatments   Labs (all labs ordered are listed, but only abnormal results are displayed) Labs Reviewed - No data to display  EKG None  Radiology No results found.  Procedures Procedures    Medications Ordered in ED Medications  ketorolac (TORADOL) 15 MG/ML injection 15 mg (15 mg Intramuscular Given 11/26/21 0845)    ED Course/ Medical Decision Making/ A&P                          Medical Decision Making Risk Prescription drug management.   Patient is overall very well-appearing and HDS, afebrile.   Patient reports pain c/w her normal RA flare. She ran out of her prednisone a couple of days ago and then started to have pain in her legs.  She states the right is worse than the left but that it switches back and forth, sometimes the left is worse than the right.  On exam they are with effusion, right greater than left, but no erythema.  Diffuse joint  tenderness palpation in multiple joints.  Overall low concern for septic arthritis given the nature of the multiple joints with pain as well as patient reporting this is consistent with her normal rheumatoid thrice flare and temporally running out of her prednisone recently.  Per chart review patient has been seen multiple times for joint pains that are similar to this, and in the absence of trauma I do not believe that imaging is necessary nor would it be beneficial at this time.  Patient was supposed to have prednisone to last her until her rheumatology appointment in a couple of days, will prescribe patient a few days of prednisone to get her to her appointment where she can be managed with rheumatology.  We will also give Toradol here today as  she has had that oxycodone does not help the pain.  Patient has no other complaints and is otherwise overall well-appearing. Patient will be given 5 days of prednisone 10 mg to get her to her appointment. She can then be managed by rheumatology.   Will be discharged with discharge instructions/return precautions. Patient reports understanding, all questions answered to patient satisfaction.           Final Clinical Impression(s) / ED Diagnoses Final diagnoses:  Rheumatoid arthritis involving both knees, unspecified whether rheumatoid factor present ([place])    Rx / DC Orders ED Discharge Orders          Ordered    predniSONE (DELTASONE) 10 MG tablet  Daily        11/26/21 0904             This note was created using dictation software, which may contain spelling or grammatical errors.    Audley Hose, MD 11/26/21 [phone]

## 2021-11-26 NOTE — Discharge Instructions (Addendum)
Thank you for coming to [facility] Emergency Department. You were seen for joint pains. We did an exam, and these showed small effusions but low concern for joint infection.  Will prescribe prednisone 10 mg for 5 days to gauge her rheumatology appointment.  Please follow-up with rheumatologist for further management and care. Please follow up with your rheumatologist in 3 days on 11/29/21. They will help manage your rheumatoid arthritis.   Do not hesitate to return to the ED or call 911 if you experience: -Worsening symptoms -Lightheadedness, passing out -Fevers/chills -Anything else that concerns you

## 2021-11-26 NOTE — ED Notes (Signed)
Pt wheeled to waiting room. Pt verbalized understanding of discharge instructions.   

## 2021-11-26 NOTE — ED Triage Notes (Signed)
Pt reports bilateral knee pain. Pt reports " I am having an arthritis flare up I hurt all over." Pt reports running out of her prednisone. OTC tylenol not helping.

## 2021-11-29 ENCOUNTER — Other Ambulatory Visit: Payer: Self-pay

## 2021-11-29 ENCOUNTER — Encounter (HOSPITAL_COMMUNITY): Payer: Self-pay

## 2021-11-29 ENCOUNTER — Emergency Department (HOSPITAL_COMMUNITY): Payer: Medicaid Other

## 2021-11-29 ENCOUNTER — Emergency Department (HOSPITAL_COMMUNITY)
Admission: EM | Admit: 2021-11-29 | Discharge: 2021-11-30 | Disposition: A | Discharge status: Home / Self Care | Payer: Medicaid Other | Attending: Emergency Medicine | Admitting: Emergency Medicine

## 2021-11-29 DIAGNOSIS — J45909 Unspecified asthma, uncomplicated: Secondary | ICD-10-CM | POA: Insufficient documentation

## 2021-11-29 DIAGNOSIS — Z20822 Contact with and (suspected) exposure to covid-19: Secondary | ICD-10-CM | POA: Diagnosis not present

## 2021-11-29 DIAGNOSIS — Z7951 Long term (current) use of inhaled steroids: Secondary | ICD-10-CM | POA: Diagnosis not present

## 2021-11-29 DIAGNOSIS — R0602 Shortness of breath: Secondary | ICD-10-CM | POA: Diagnosis not present

## 2021-11-29 DIAGNOSIS — R091 Pleurisy: Secondary | ICD-10-CM | POA: Diagnosis not present

## 2021-11-29 DIAGNOSIS — R0781 Pleurodynia: Secondary | ICD-10-CM | POA: Insufficient documentation

## 2021-11-29 LAB — CBC WITH DIFFERENTIAL/PLATELET
Abs Immature Granulocytes: 0.22 10*3/uL — ABNORMAL HIGH (ref 0.00–0.07)
Basophils Absolute: 0 10*3/uL (ref 0.0–0.1)
Basophils Relative: 0 %
Eosinophils Absolute: 0.1 10*3/uL (ref 0.0–0.5)
Eosinophils Relative: 1 %
HCT: 36.3 % (ref 36.0–46.0)
Hemoglobin: 10.8 g/dL — ABNORMAL LOW (ref 12.0–15.0)
Immature Granulocytes: 2 %
Lymphocytes Relative: 7 %
Lymphs Abs: 0.8 10*3/uL (ref 0.7–4.0)
MCH: 24.9 pg — ABNORMAL LOW (ref 26.0–34.0)
MCHC: 29.8 g/dL — ABNORMAL LOW (ref 30.0–36.0)
MCV: 83.6 fL (ref 80.0–100.0)
Monocytes Absolute: 0.3 10*3/uL (ref 0.1–1.0)
Monocytes Relative: 2 %
Neutro Abs: 10.9 10*3/uL — ABNORMAL HIGH (ref 1.7–7.7)
Neutrophils Relative %: 88 %
Platelets: 498 10*3/uL — ABNORMAL HIGH (ref 150–400)
RBC: 4.34 MIL/uL (ref 3.87–5.11)
RDW: 15.4 % (ref 11.5–15.5)
WBC: 12.3 10*3/uL — ABNORMAL HIGH (ref 4.0–10.5)
nRBC: 0 % (ref 0.0–0.2)

## 2021-11-29 LAB — BASIC METABOLIC PANEL
Anion gap: 10 (ref 5–15)
BUN: 12 mg/dL (ref 6–20)
CO2: 24 mmol/L (ref 22–32)
Calcium: 8.9 mg/dL (ref 8.9–10.3)
Chloride: 102 mmol/L (ref 98–111)
Creatinine, Ser: 0.47 mg/dL (ref 0.44–1.00)
GFR, Estimated: >60 mL/min (ref >60)
Glucose, Bld: 179 mg/dL — ABNORMAL HIGH (ref 70–99)
Potassium: 4 mmol/L (ref 3.5–5.1)
Sodium: 136 mmol/L (ref 135–145)

## 2021-11-29 LAB — TROPONIN I (HIGH SENSITIVITY): Troponin I (High Sensitivity): <2 ng/L (ref <18)

## 2021-11-29 MED ORDER — IOHEXOL 350 MG/ML SOLN
100.0000 mL | Freq: Once | INTRAVENOUS | Status: AC | PRN
  Administered 2021-11-29: 100 mL via INTRAVENOUS

## 2021-11-29 MED ORDER — KETOROLAC TROMETHAMINE 30 MG/ML IJ SOLN
15.0000 mg | Freq: Once | INTRAMUSCULAR | Status: AC
  Administered 2021-11-29: 15 mg via INTRAVENOUS
  Filled 2021-11-29: qty 1

## 2021-11-29 MED ORDER — SODIUM CHLORIDE 0.9 % IV BOLUS
1000.0000 mL | Freq: Once | INTRAVENOUS | Status: AC
  Administered 2021-11-29: 1000 mL via INTRAVENOUS

## 2021-11-29 NOTE — ED Provider Notes (Signed)
[facility] EMERGENCY DEPARTMENT Provider Note   CSN: [id number] Arrival date & time: 11/29/21  1849     History  Chief Complaint  Patient presents with   pain in rib    Amy Hall is a 56 y.o. female with a history of rheumatoid arthritis, also ongoing anemia for which she is currently being worked up, degenerative disc disease, asthma presenting for evaluation of left-sided rib pain and pain with deep inspiration.  She reports having some soreness in her left chest area for more than a week but today she suddenly developed pain with breathing, also endorses some mild shortness of breath.  She has had no significant cough or congestion, no recent URI type symptoms, she has however had some subjective fevers prior to arrival.  She was seen by her rheumatologist at [facility] today at which time lab tests were drawn, a CT of her chest has also been ordered.  Patient presents here secondary to worsening pain.  Review of outside records show that multiple labs have resulted from her rheumatology appointment today.  Significant including an elevated sed rate of 105 a CRP of 102.1 and a D-dimer of 1950.  She report some soreness behind her left knee, no swelling.   The history is provided by the patient and medical records.       Home Medications Prior to Admission medications   Medication Sig Start Date End Date Taking? Authorizing Provider  albuterol (PROVENTIL HFA;VENTOLIN HFA) 108 (90 Base) MCG/ACT inhaler Inhale 2 puffs into the lungs every 6 (six) hours as needed for wheezing or shortness of breath. 03/20/16   Charlott Rakes, MD  cephALEXin (KEFLEX) 500 MG capsule Take 1 capsule (500 mg total) by mouth 2 (two) times daily. 10/13/20     DULoxetine (CYMBALTA) 30 MG capsule Take three capsules (90 mg dose) by mouth at bedtime. 10/13/20     ferrous gluconate (FERGON) 324 MG tablet Take 324 mg by mouth 2 (two) times daily. 04/26/21   [provider]  folic acid (FOLVITE) 1 MG  tablet Take by mouth. 12/26/20 12/26/21  [provider]  hydrocortisone ointment 0.5 % Apply 1 application topically 2 (two) times daily. 09/25/17   Charlott Rakes, MD  hydrOXYzine (VISTARIL) 25 MG capsule Take one capsule (25 mg dose) by mouth 3 (three) times a day as needed. Patient not taking: Reported on 09/01/2021 10/13/20     lidocaine (XYLOCAINE) 2 % solution Use as directed 15 mLs in the mouth or throat as needed for mouth pain. 09/01/21   Myna Bright M, PA-C  omeprazole (PRILOSEC) 40 MG capsule Take 40 mg by mouth daily. 09/14/16   [provider]  pantoprazole (PROTONIX) 20 MG tablet TAKE ONE TABLET (20 MG DOSE) BY MOUTH DAILY. 04/14/20 04/14/21  Ulyses Jarred C, NP-C  pantoprazole (PROTONIX) 20 MG tablet Take one tablet (20 mg dose) by mouth daily. 06/10/20     predniSONE (DELTASONE) 10 MG tablet Take 1 tablet (10 mg total) by mouth daily for 5 days. 11/26/21 12/01/21  Audley Hose, MD  Upadacitinib ER (RINVOQ) 15 MG TB24 Take 15 mg by mouth daily. 02/18/20   [provider]      Allergies    Gabapentin    Review of Systems   Review of Systems  Constitutional:  Positive for fever.  HENT:  Negative for congestion and sore throat.   Eyes: Negative.   Respiratory:  Positive for shortness of breath. Negative for chest tightness.   Cardiovascular:  Positive  for palpitations. Negative for chest pain and leg swelling.  Gastrointestinal:  Negative for abdominal pain and nausea.  Genitourinary: Negative.   Musculoskeletal:  Positive for myalgias. Negative for arthralgias, joint swelling and neck pain.  Skin: Negative.  Negative for rash and wound.  Neurological:  Negative for dizziness, weakness, light-headedness, numbness and headaches.  Psychiatric/Behavioral: Negative.      Physical Exam Updated Vital Signs BP 112/79   Pulse (!) 124   Temp 98.2 F (36.8 C) (Oral)   Resp 17   Ht 5' (1.524 m)   Wt 49.9 kg   LMP 01/16/2015   SpO2 100%   BMI 21.48  kg/m  Physical Exam Vitals and nursing note reviewed.  Constitutional:      Appearance: She is well-developed.  HENT:     Head: Normocephalic and atraumatic.  Eyes:     Conjunctiva/sclera: Conjunctivae normal.  Cardiovascular:     Rate and Rhythm: Normal rate and regular rhythm.     Heart sounds: Normal heart sounds.  Pulmonary:     Effort: Pulmonary effort is normal.     Breath sounds: Normal breath sounds. No wheezing.  Abdominal:     General: Bowel sounds are normal.     Palpations: Abdomen is soft.     Tenderness: There is no abdominal tenderness.  Musculoskeletal:        General: Normal range of motion.     Cervical back: Normal range of motion.     Right lower leg: No edema.     Left lower leg: No edema.  Skin:    General: Skin is warm and dry.  Neurological:     General: No focal deficit present.     Mental Status: She is alert.     ED Results / Procedures / Treatments   Labs (all labs ordered are listed, but only abnormal results are displayed) Labs Reviewed  CBC WITH DIFFERENTIAL/PLATELET - Abnormal; Notable for the following components:      Result Value   WBC 12.3 (*)    Hemoglobin 10.8 (*)    MCH 24.9 (*)    MCHC 29.8 (*)    Platelets 498 (*)    Neutro Abs 10.9 (*)    Abs Immature Granulocytes 0.22 (*)    All other components within normal limits  BASIC METABOLIC PANEL - Abnormal; Notable for the following components:   Glucose, Bld 179 (*)    All other components within normal limits  RESP PANEL BY RT-PCR (FLU A&B, COVID) ARPGX2  TROPONIN I (HIGH SENSITIVITY)    EKG EKG Interpretation  Date/Time:  Wednesday November 29 2021 19:07:53 EST Ventricular Rate:  85 PR Interval:  105 QRS Duration: 84 QT Interval:  394 QTC Calculation: 469 R Axis:   85 Text Interpretation: Sinus rhythm Short PR interval similar to prior tracing Confirmed by Wynona Dove ([id number]) on 11/29/2021 11:23:51 PM  Radiology CT Angio Chest PE W and/or Wo Contrast  Result  Date: 11/29/2021 CLINICAL DATA:  Left rib pain. EXAM: CT ANGIOGRAPHY CHEST WITH CONTRAST TECHNIQUE: Multidetector CT imaging of the chest was performed using the standard protocol during bolus administration of intravenous contrast. Multiplanar CT image reconstructions and MIPs were obtained to evaluate the vascular anatomy. RADIATION DOSE REDUCTION: This exam was performed according to the departmental dose-optimization program which includes automated exposure control, adjustment of the mA and/or kV according to patient size and/or use of iterative reconstruction technique. CONTRAST:  168m OMNIPAQUE IOHEXOL 350 MG/ML SOLN COMPARISON:  None Available. FINDINGS: Cardiovascular:  There is mild calcification of the aortic arch, without evidence of aortic aneurysm or dissection. Satisfactory opacification of the pulmonary arteries to the segmental level. No evidence of pulmonary embolism. Normal heart size with marked severity coronary artery calcification. No pericardial effusion. Mediastinum/Nodes: No enlarged mediastinal, hilar, or axillary lymph nodes. Thyroid gland, trachea, and esophagus demonstrate no significant findings. Lungs/Pleura: Lungs are clear. No pleural effusion or pneumothorax. Upper Abdomen: There is a large gastric hernia. Musculoskeletal: Multilevel degenerative changes seen throughout the thoracic spine. Review of the MIP images confirms the above findings. IMPRESSION: 1. No evidence of pulmonary embolism or other acute intrathoracic process. 2. Large gastric hernia. 3. Marked severity coronary artery calcification. 4. Aortic atherosclerosis. Aortic Atherosclerosis (ICD10-I70.0). Electronically Signed   By: Virgina Norfolk M.D.   On: 11/29/2021 22:50    Procedures Procedures    Medications Ordered in ED Medications  ketorolac (TORADOL) 30 MG/ML injection 15 mg (has no administration in time range)  iohexol (OMNIPAQUE) 350 MG/ML injection 100 mL (100 mLs Intravenous Contrast Given  11/29/21 2229)  sodium chloride 0.9 % bolus 1,000 mL (1,000 mLs Intravenous New Bag/Given 11/29/21 2321)    ED Course/ Medical Decision Making/ A&P                           Medical Decision Making Patient with chronic pain from rheumatoid arthritis presenting with a several day history of left chest pain, pleuritic in character, worse today.  Seen by her rheumatologist this morning who obtained blood work including a D-dimer which showed a significantly elevated result at [place].  She presents with tachycardia as well.  No nausea or vomiting, she has had subjective fever, mild shortness of breath, no cough.  CT angio was completed over concern of pulmonary embolism, the this test was negative for PE, also negative for pneumonia.  She states she has been hydrating well, but we will give IV fluids, pain medication prescribed.  She does endorse aching all over, probably from her rheumatoid arthritis but also could be myalgias from an infectious process.  She does have a WBC count of 12.3.  Given her scan is negative for PE, added respiratory panel looking for a viral process as a source of her symptoms.  She denies being particularly anxious and does not feel this is the reason for her tachycardia.   Patient will be dispo by Dr. Dayna Barker once respiratory panel is completed and IV fluids have been completed.  Amount and/or Complexity of Data Reviewed Labs: ordered.    Details: Per above Radiology: ordered.    Details: Per above  Risk Prescription drug management.           Final Clinical Impression(s) / ED Diagnoses Final diagnoses:  Pleurisy    Rx / DC Orders ED Discharge Orders     None         Landis Martins 11/29/21 2340    Mesner, Corene Cornea, MD 11/30/21 [phone]

## 2021-11-29 NOTE — ED Triage Notes (Signed)
Pt reports left rib pain. States it hurts when breathing in. Pt states "I hurt everywhere."

## 2021-11-29 NOTE — ED Notes (Signed)
Pt ambulatory to the restroom and back to room unassisted.

## 2021-11-30 LAB — RESP PANEL BY RT-PCR (FLU A&B, COVID) ARPGX2
Influenza A by PCR: NEGATIVE
Influenza B by PCR: NEGATIVE
SARS Coronavirus 2 by RT PCR: NEGATIVE

## 2022-03-25 ENCOUNTER — Emergency Department (HOSPITAL_COMMUNITY)
Admission: EM | Admit: 2022-03-25 | Discharge: 2022-03-25 | Disposition: A | Discharge status: Home / Self Care | Payer: Medicaid Other | Attending: Emergency Medicine | Admitting: Emergency Medicine

## 2022-03-25 ENCOUNTER — Emergency Department (HOSPITAL_COMMUNITY): Payer: Medicaid Other

## 2022-03-25 ENCOUNTER — Encounter (HOSPITAL_COMMUNITY): Payer: Self-pay

## 2022-03-25 ENCOUNTER — Other Ambulatory Visit: Payer: Self-pay

## 2022-03-25 DIAGNOSIS — M25552 Pain in left hip: Secondary | ICD-10-CM | POA: Diagnosis not present

## 2022-03-25 DIAGNOSIS — S7001XA Contusion of right hip, initial encounter: Secondary | ICD-10-CM

## 2022-03-25 DIAGNOSIS — M25532 Pain in left wrist: Secondary | ICD-10-CM | POA: Diagnosis not present

## 2022-03-25 DIAGNOSIS — W19XXXA Unspecified fall, initial encounter: Secondary | ICD-10-CM | POA: Diagnosis not present

## 2022-03-25 DIAGNOSIS — S79911A Unspecified injury of right hip, initial encounter: Secondary | ICD-10-CM | POA: Diagnosis present

## 2022-03-25 DIAGNOSIS — H9201 Otalgia, right ear: Secondary | ICD-10-CM | POA: Insufficient documentation

## 2022-03-25 MED ORDER — PREDNISONE 10 MG PO TABS
10.0000 mg | ORAL_TABLET | Freq: Once | ORAL | Status: AC
  Administered 2022-03-25: 10 mg via ORAL
  Filled 2022-03-25: qty 1

## 2022-03-25 MED ORDER — AMOXICILLIN 250 MG PO CAPS
1000.0000 mg | ORAL_CAPSULE | Freq: Once | ORAL | Status: AC
  Administered 2022-03-25: 1000 mg via ORAL
  Filled 2022-03-25: qty 4

## 2022-03-25 MED ORDER — PREDNISONE 10 MG PO TABS: 10.0000 mg | ORAL_TABLET | Freq: Every day | ORAL | 0 refills | Status: DC

## 2022-03-25 MED ORDER — HYDROCODONE-ACETAMINOPHEN 5-325 MG PO TABS
1.0000 | ORAL_TABLET | Freq: Once | ORAL | Status: AC
  Administered 2022-03-25: 1 via ORAL
  Filled 2022-03-25: qty 1

## 2022-03-25 MED ORDER — AMOXICILLIN 500 MG PO CAPS: 500.0000 mg | ORAL_CAPSULE | Freq: Three times a day (TID) | ORAL | 0 refills | Status: AC

## 2022-03-25 NOTE — ED Provider Notes (Cosign Needed)
[facility] Provider Note   CSN: [id number] Arrival date & time: 03/25/22  1528     History  Chief Complaint  Patient presents with   Fall   HPI Amy Hall is a 57 y.o. female with rheumatoid arthritis, degenerative disease, osteoporosis of the lumbar spine, cocaine abuse, and depression presenting for evaluation status post fall.  Fall occurred last night.  Patient states she was walking in her house and tripped over a bag.  Denies head injury or loss of consciousness.  States she landed on her left side and caught herself with her left hand.  She noted some some pain and swelling in her left wrist and states that both her left hip and right hip are hurting as well.  Right is more painful than left.  Patient also mention that she is having difficulty moving her arm and that her left shoulder hurts. Patient also endorsed right ear pain.   Fall       Home Medications Prior to Admission medications   Medication Sig Start Date End Date Taking? Authorizing Provider  amoxicillin (AMOXIL) 500 MG capsule Take 1 capsule (500 mg total) by mouth 3 (three) times daily. 03/25/22  Yes Harriet Pho, PA-C  albuterol (PROVENTIL HFA;VENTOLIN HFA) 108 (90 Base) MCG/ACT inhaler Inhale 2 puffs into the lungs every 6 (six) hours as needed for wheezing or shortness of breath. 03/20/16   Charlott Rakes, MD  cephALEXin (KEFLEX) 500 MG capsule Take 1 capsule (500 mg total) by mouth 2 (two) times daily. 10/13/20     DULoxetine (CYMBALTA) 30 MG capsule Take three capsules (90 mg dose) by mouth at bedtime. 10/13/20     ferrous gluconate (FERGON) 324 MG tablet Take 324 mg by mouth 2 (two) times daily. 04/26/21   [provider]  hydrocortisone ointment 0.5 % Apply 1 application topically 2 (two) times daily. 09/25/17   Charlott Rakes, MD  hydrOXYzine (VISTARIL) 25 MG capsule Take one capsule (25 mg dose) by mouth 3 (three) times a day as needed. Patient  not taking: Reported on 09/01/2021 10/13/20     lidocaine (XYLOCAINE) 2 % solution Use as directed 15 mLs in the mouth or throat as needed for mouth pain. 09/01/21   Myna Bright M, PA-C  omeprazole (PRILOSEC) 40 MG capsule Take 40 mg by mouth daily. 09/14/16   [provider]  pantoprazole (PROTONIX) 20 MG tablet TAKE ONE TABLET (20 MG DOSE) BY MOUTH DAILY. 04/14/20 04/14/21  Ulyses Jarred C, NP-C  pantoprazole (PROTONIX) 20 MG tablet Take one tablet (20 mg dose) by mouth daily. 06/10/20     Upadacitinib ER (RINVOQ) 15 MG TB24 Take 15 mg by mouth daily. 02/18/20   [provider]      Allergies    Gabapentin    Review of Systems   See HPI for pertinent positives   Physical Exam   Vitals:   03/25/22 2018 03/25/22 2318  BP: 139/80 112/62  Pulse: (!) 101 (!) 101  Resp: 20 18  Temp: 98.8 F (37.1 C) 98.7 F (37.1 C)  SpO2: 97% 97%    CONSTITUTIONAL: well-appearing, NAD NEURO:  Alert and oriented x 3, CN 3-12 grossly intact EYES:  eyes equal and reactive ENT/NECK:  Supple, no stridor, right ear: TM erythematous, bulging, effusion noted CARDIO: tachycardic and regular rhythm, appears well-perfused  PULM:  No respiratory distress, CTAB GI/GU:  non-distended, soft, non tender MSK/SPINE: Obvious bony prominence in the left wrist, no swelling  noted, range of motion in the left hip normal without pain, range of motion of the right hip limited due to pain.  Patient refused to fully flex or extend her leg during assessment.  DP pulses +2 bilaterally.  Radial pulses +2 bilaterally.  Normal sensation in the upper and lower extremities. SKIN:  no rash, atraumatic  *Additional and/or pertinent findings included in MDM below  ED Results / Procedures / Treatments   Labs (all labs ordered are listed, but only abnormal results are displayed) Labs Reviewed - No data to display  EKG None  Radiology CT Hip Right Wo Contrast  Result Date: 03/25/2022 CLINICAL DATA:  Hip fracture  suspected after fall.  Right hip pain EXAM: CT OF THE RIGHT HIP WITHOUT CONTRAST TECHNIQUE: Multidetector CT imaging of the right hip was performed according to the standard protocol. Multiplanar CT image reconstructions were also generated. RADIATION DOSE REDUCTION: This exam was performed according to the departmental dose-optimization program which includes automated exposure control, adjustment of the mA and/or kV according to patient size and/or use of iterative reconstruction technique. COMPARISON:  Radiographs earlier today FINDINGS: Bones/Joint/Cartilage No acute fracture or dislocation. Small hip joint effusion. Mild degenerative change about the hip. Ligaments Suboptimally assessed by CT. Muscles and Tendons Unremarkable CT appearance. Soft tissues Edema within the subcutaneous fat of the medial buttock and posteromedial upper right thigh IMPRESSION: 1. No acute fracture or dislocation. 2. Edema within the subcutaneous fat of the medial buttock and posteromedial upper right thigh possibly due to contusion in the setting of trauma. Electronically Signed   By: Placido Sou M.D.   On: 03/25/2022 21:06   DG Shoulder Left  Result Date: 03/25/2022 CLINICAL DATA:  Fall with injury to the shoulder EXAM: LEFT SHOULDER - 3 VIEW COMPARISON:  None Available. FINDINGS: There is no evidence of fracture or dislocation. There is no evidence of arthropathy or other focal bone abnormality. Soft tissues are unremarkable. IMPRESSION: No acute fracture or dislocation. Electronically Signed   By: Darrin Nipper M.D.   On: 03/25/2022 19:05   DG Hip Unilat W or Wo Pelvis 2-3 Views Right  Result Date: 03/25/2022 CLINICAL DATA:  Fall.  Injury. EXAM: DG HIP (WITH OR WITHOUT PELVIS) 2-3V RIGHT COMPARISON:  None Available. FINDINGS: There is no evidence of hip fracture or dislocation. Degenerative changes are seen in the LOWER lumbar spine. Visualized bowel gas pattern is nonobstructive. IMPRESSION: No evidence of hip fracture  or dislocation. Electronically Signed   By: Nolon Nations M.D.   On: 03/25/2022 16:34   DG Wrist Complete Left  Result Date: 03/25/2022 CLINICAL DATA:  Fall, left wrist pain and swelling EXAM: LEFT WRIST - COMPLETE 3+ VIEW COMPARISON:  None Available. FINDINGS: Diffuse osseous demineralization. No evidence of an acute fracture. Severe arthropathy of the left wrist with bony erosions or resorption involving the scaphoid, lunate, and the distal radius and ulna. Proximal migration of the capitate. There is a ovoid 9 mm bony density along the dorsal aspect of the ulnar neck. Diffuse soft tissue swelling about the wrist. IMPRESSION: 1. No evidence of an acute fracture. 2. Severe arthropathy of the left wrist with bony erosions or resorption involving the scaphoid, lunate, and the distal radius and ulna. Findings are most compatible with an underlying inflammatory arthropathy such as rheumatoid arthritis. 3. SLAC wrist with proximal migration of the capitate. Electronically Signed   By: Davina Poke D.O.   On: 03/25/2022 15:55    Procedures Procedures    Medications Ordered  in ED Medications  HYDROcodone-acetaminophen (NORCO/VICODIN) 5-325 MG per tablet 1 tablet (1 tablet Oral Given 03/25/22 1557)  amoxicillin (AMOXIL) capsule 1,000 mg (1,000 mg Oral Given 03/25/22 1800)  predniSONE (DELTASONE) tablet 10 mg (10 mg Oral Given 03/25/22 1801)  HYDROcodone-acetaminophen (NORCO/VICODIN) 5-325 MG per tablet 1 tablet (1 tablet Oral Given 03/25/22 1938)    ED Course/ Medical Decision Making/ A&P                             Medical Decision Making Amount and/or Complexity of Data Reviewed Radiology: ordered.  Risk Prescription drug management.   57 year old female who is well-appearing presenting for fall.  Exam notable for deformity in the left wrist and limited mobility due to pain in the right hip, and findings consistent with otitis media.  Asked patient about the bony prominence in her left  wrist she stated that is not new.  X-ray of the left wrist also noted bony erosion in the left wrist consistent with rheumatoid arthritis.  Treated pain with Norco.  Symptoms improved minimally.  Discussed with patient who stated that pain in her wrist and hips is similar to her rheumatoid arthritis flares.  Treated with 10 mg of prednisone.  Did review her chart which revealed she is followed by rheumatology at [facility] and plan at this time is to wean her off of her steroids and potentially try another agent for her rheumatoid arthritis.  She is scheduled to follow-up with them in few days.  Ambulated patient and she still endorse some pain in the right hip which prompted further workup with CT of the right hip which revealed findings consistent with possible contusion in the right buttock.  This is likely cause of her pain.  No evidence of hip fracture.  Able to ambulate and bear weight with walker.  Advised her to follow-up with orthopedics and encouraged her to follow-up with a rheumatologist next week.  Discussed return precautions.  Also patient requested that I evaluate her right ear due to ear pain for the last week.  Clinical findings suggest otitis media.  Started on amoxicillin.  Follow-up with her PCP.     Final Clinical Impression(s) / ED Diagnoses Final diagnoses:  Contusion of right hip, initial encounter    Rx / DC Orders ED Discharge Orders          Ordered    amoxicillin (AMOXIL) 500 MG capsule  3 times daily        03/25/22 1733    predniSONE (DELTASONE) 10 MG tablet  Daily,   Status:  Discontinued        03/25/22 [place]              Harriet Pho, PA-C 03/25/22 1737    Harriet Pho, PA-C 03/26/22 1250    Cristie Hem, MD 03/26/22 1524

## 2022-03-25 NOTE — ED Notes (Signed)
Patient ambulating in room, reports discomfort when bearing weight on right leg.

## 2022-03-25 NOTE — ED Triage Notes (Signed)
Pt fell today and injured L wrist, swelling and deformity noted. Did not hit head, denies LOC. Denies blood thinners.   C/o R knee pain.

## 2022-03-25 NOTE — ED Notes (Signed)
Patient ambulating in room with walker, reports discomfort when bearing weight to right leg.

## 2022-03-25 NOTE — Discharge Instructions (Addendum)
Evaluation for your fall was overall reassuring x-rays and CT scans did not reveal any acute process that may be causing her symptoms.  Symptoms could be a flare of your rheumatoid arthritis.  I do recommend that you follow-up with your rheumatologist at your scheduled appointment next week.  Also recommend you follow-up with your PCP for your persistent hip pain.  In the meantime you can apply ice to areas of pain to reduce swelling.  You can also take ibuprofen and Tylenol for pain relief.

## 2022-03-25 NOTE — ED Provider Notes (Incomplete Revision)
[facility] Provider Note   CSN: [id number] Arrival date & time: 03/25/22  1528     History  Chief Complaint  Patient presents with   Fall   HPI Amy Hall is a 57 y.o. female with rheumatoid arthritis, degenerative disease, osteoporosis of the lumbar spine, cocaine abuse, and depression presenting for evaluation status post fall.  Fall occurred last night.  Patient states she was walking in her house and tripped over a bag.  Denies head injury or loss of consciousness.  States she landed on her left side and caught herself with her left hand.  She noted some some pain and swelling in her left wrist and states that both her left hip and right hip are hurting as well.  Right is more painful than left.  Patient also mention that she is having difficulty moving her arm and that her left shoulder hurts. Patient also endorsed right ear pain.   Fall       Home Medications Prior to Admission medications   Medication Sig Start Date End Date Taking? Authorizing Provider  amoxicillin (AMOXIL) 500 MG capsule Take 1 capsule (500 mg total) by mouth 3 (three) times daily. 03/25/22  Yes Harriet Pho, PA-C  albuterol (PROVENTIL HFA;VENTOLIN HFA) 108 (90 Base) MCG/ACT inhaler Inhale 2 puffs into the lungs every 6 (six) hours as needed for wheezing or shortness of breath. 03/20/16   Charlott Rakes, MD  cephALEXin (KEFLEX) 500 MG capsule Take 1 capsule (500 mg total) by mouth 2 (two) times daily. 10/13/20     DULoxetine (CYMBALTA) 30 MG capsule Take three capsules (90 mg dose) by mouth at bedtime. 10/13/20     ferrous gluconate (FERGON) 324 MG tablet Take 324 mg by mouth 2 (two) times daily. 04/26/21   [provider]  hydrocortisone ointment 0.5 % Apply 1 application topically 2 (two) times daily. 09/25/17   Charlott Rakes, MD  hydrOXYzine (VISTARIL) 25 MG capsule Take one capsule (25 mg dose) by mouth 3 (three) times a day as needed. Patient  not taking: Reported on 09/01/2021 10/13/20     lidocaine (XYLOCAINE) 2 % solution Use as directed 15 mLs in the mouth or throat as needed for mouth pain. 09/01/21   Myna Bright M, PA-C  omeprazole (PRILOSEC) 40 MG capsule Take 40 mg by mouth daily. 09/14/16   [provider]  pantoprazole (PROTONIX) 20 MG tablet TAKE ONE TABLET (20 MG DOSE) BY MOUTH DAILY. 04/14/20 04/14/21  Ulyses Jarred C, NP-C  pantoprazole (PROTONIX) 20 MG tablet Take one tablet (20 mg dose) by mouth daily. 06/10/20     predniSONE (DELTASONE) 10 MG tablet Take 1 tablet (10 mg total) by mouth daily for 5 days. 03/25/22 03/30/22  Harriet Pho, PA-C  Upadacitinib ER (RINVOQ) 15 MG TB24 Take 15 mg by mouth daily. 02/18/20   [provider]      Allergies    Gabapentin    Review of Systems   See HPI for pertinent positives   Physical Exam   Vitals:   03/25/22 1533 03/25/22 1700  BP: (!) 145/100 (!) 143/83  Pulse: (!) 108 100  Resp: (!) 22 20  Temp: 99.4 F (37.4 C) 98.9 F (37.2 C)  SpO2: 96% 97%    CONSTITUTIONAL: well-appearing, NAD NEURO:  Alert and oriented x 3, CN 3-12 grossly intact EYES:  eyes equal and reactive ENT/NECK:  Supple, no stridor, right ear: TM erythematous, bulging, effusion noted CARDIO: tachycardic and  regular rhythm, appears well-perfused  PULM:  No respiratory distress, CTAB GI/GU:  non-distended, soft, non tender MSK/SPINE: Obvious bony prominence in the left wrist, no swelling noted, range of motion in the left hip normal without pain, range of motion of the right hip limited due to pain.  Patient refused to fully flex or extend her leg during assessment.  DP pulses +2 bilaterally.  Radial pulses +2 bilaterally.  Normal sensation in the upper and lower extremities. SKIN:  no rash, atraumatic  *Additional and/or pertinent findings included in MDM below  ED Results / Procedures / Treatments   Labs (all labs ordered are listed, but only abnormal results are  displayed) Labs Reviewed - No data to display  EKG None  Radiology DG Hip Unilat W or Wo Pelvis 2-3 Views Right  Result Date: 03/25/2022 CLINICAL DATA:  Fall.  Injury. EXAM: DG HIP (WITH OR WITHOUT PELVIS) 2-3V RIGHT COMPARISON:  None Available. FINDINGS: There is no evidence of hip fracture or dislocation. Degenerative changes are seen in the LOWER lumbar spine. Visualized bowel gas pattern is nonobstructive. IMPRESSION: No evidence of hip fracture or dislocation. Electronically Signed   By: Nolon Nations M.D.   On: 03/25/2022 16:34   DG Wrist Complete Left  Result Date: 03/25/2022 CLINICAL DATA:  Fall, left wrist pain and swelling EXAM: LEFT WRIST - COMPLETE 3+ VIEW COMPARISON:  None Available. FINDINGS: Diffuse osseous demineralization. No evidence of an acute fracture. Severe arthropathy of the left wrist with bony erosions or resorption involving the scaphoid, lunate, and the distal radius and ulna. Proximal migration of the capitate. There is a ovoid 9 mm bony density along the dorsal aspect of the ulnar neck. Diffuse soft tissue swelling about the wrist. IMPRESSION: 1. No evidence of an acute fracture. 2. Severe arthropathy of the left wrist with bony erosions or resorption involving the scaphoid, lunate, and the distal radius and ulna. Findings are most compatible with an underlying inflammatory arthropathy such as rheumatoid arthritis. 3. SLAC wrist with proximal migration of the capitate. Electronically Signed   By: Davina Poke D.O.   On: 03/25/2022 15:55    Procedures Procedures    Medications Ordered in ED Medications  amoxicillin (AMOXIL) capsule 1,000 mg (has no administration in time range)  predniSONE (DELTASONE) tablet 10 mg (has no administration in time range)  HYDROcodone-acetaminophen (NORCO/VICODIN) 5-325 MG per tablet 1 tablet (1 tablet Oral Given 03/25/22 1557)    ED Course/ Medical Decision Making/ A&P                             Medical Decision  Making Amount and/or Complexity of Data Reviewed Radiology: ordered.  Risk Prescription drug management.   57 year old female who is well-appearing presenting for fall.  Exam notable for deformity in the left wrist and limited mobility due to pain in the right hip, and findings consistent with otitis media.  Asked patient about the bony prominence in her left wrist she stated that is not new.  X-ray of the left wrist also noted bony erosion in the left wrist consistent with rheumatoid arthritis.  Treated pain with Norco.  Symptoms improved minimally.  Discussed with patient who stated that pain in her wrist and hips is similar to her rheumatoid arthritis flares.  Treated with 10 mg of prednisone.  Also sent 5 tablets of prednisone to her pharmacy.  States she does have follow-up with her rheumatologist on the 18th of this month.  I encouraged her to go to this appointment as her symptoms are likely related to a flare of her rheumatoid arthritis.  Started her on amoxicillin for what is likely otitis media in the right ear. advised her to follow-up with her PCP.    Final Clinical Impression(s) / ED Diagnoses Final diagnoses:  None    Rx / DC Orders ED Discharge Orders          Ordered    amoxicillin (AMOXIL) 500 MG capsule  3 times daily        03/25/22 1733    predniSONE (DELTASONE) 10 MG tablet  Daily        03/25/22 1734              Harriet Pho, PA-C 03/25/22 1737

## 2022-03-26 IMAGING — CT CT RENAL STONE PROTOCOL
2 of 4 series · 15 of 46 positions shown, 17 images · non-contrast
Comparison: None.

CLINICAL DATA: Flank pain.  Dysuria.  Low back pain.



[Series 2: axial st · axial · 0.92mm/px · z∈[+778,+1173]mm · 12 of 91 slices shown, 14 images]
[im 6/91  soft-tissue]
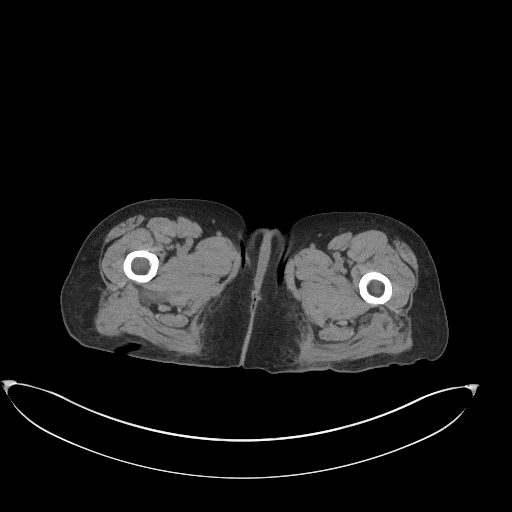
[im 6/91  bone]
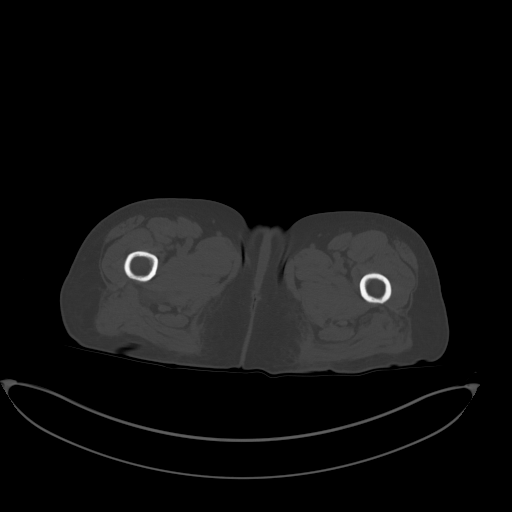
[im 16/91  soft-tissue]
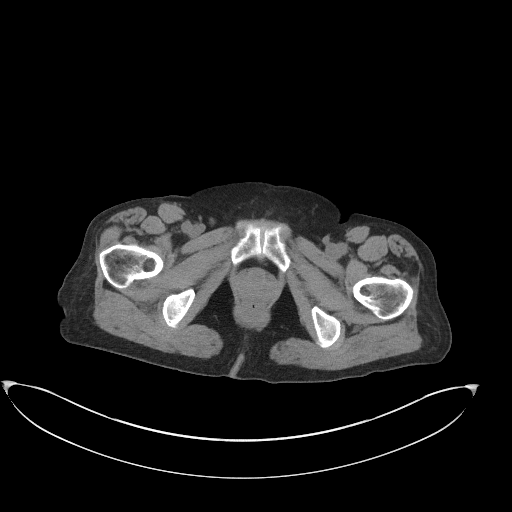
[im 22/91  soft-tissue]
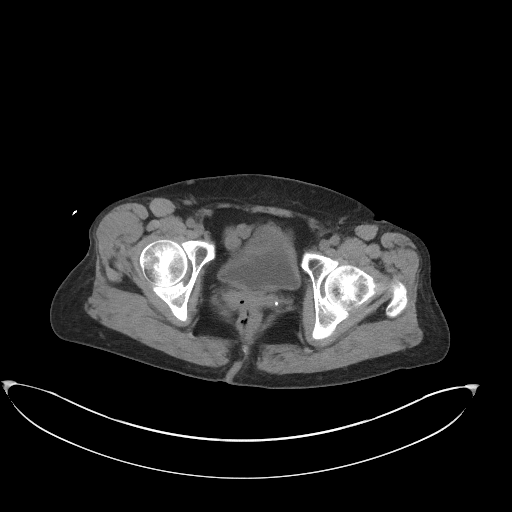
[im 27/91  soft-tissue]
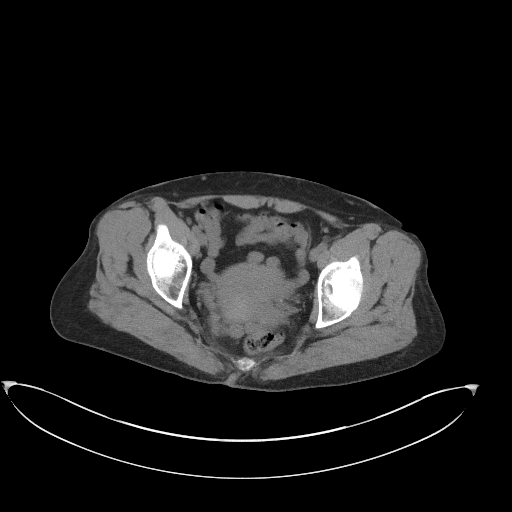
[im 38/91  soft-tissue]
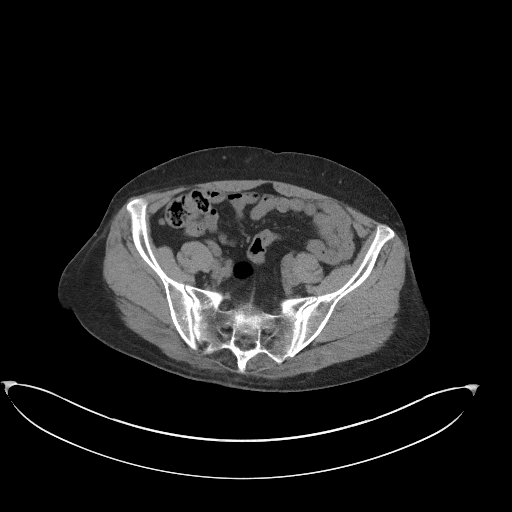
[im 43/91  soft-tissue]
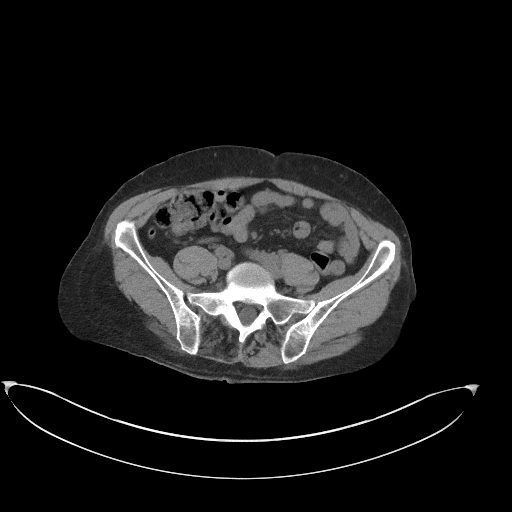
[im 48/91  soft-tissue]
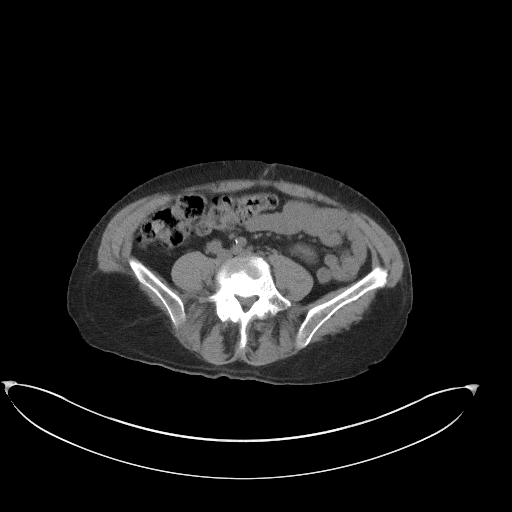
[im 59/91  soft-tissue]
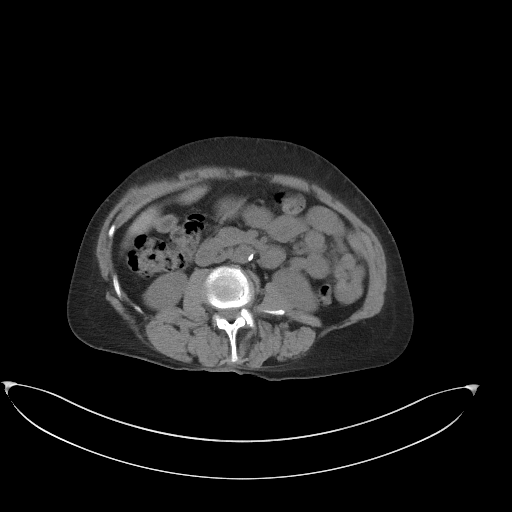
[im 64/91  soft-tissue]
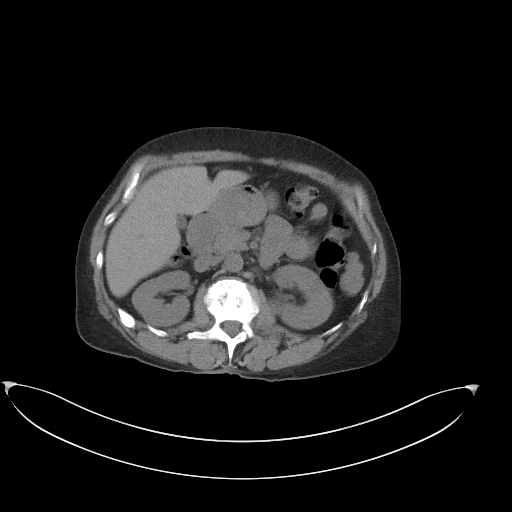
[im 64/91  bone]
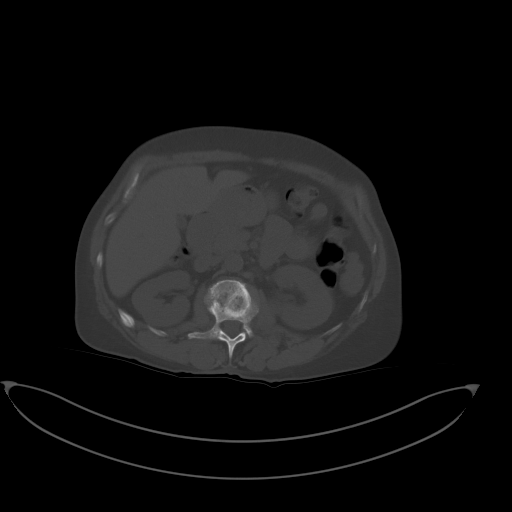
[im 69/91  soft-tissue]
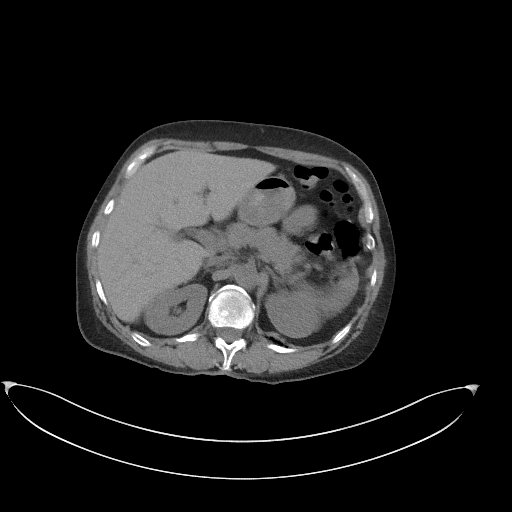
[im 80/91  soft-tissue]
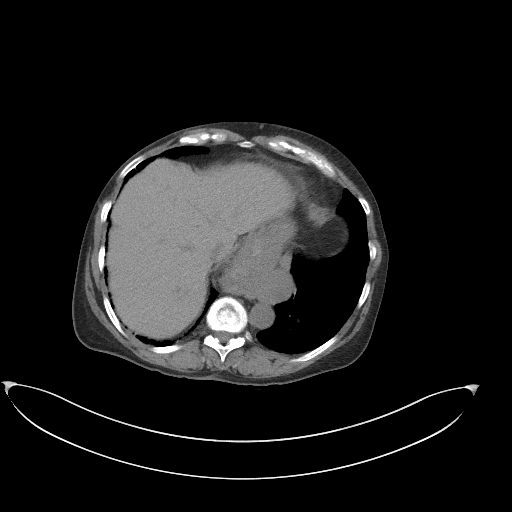
[im 85/91  soft-tissue]
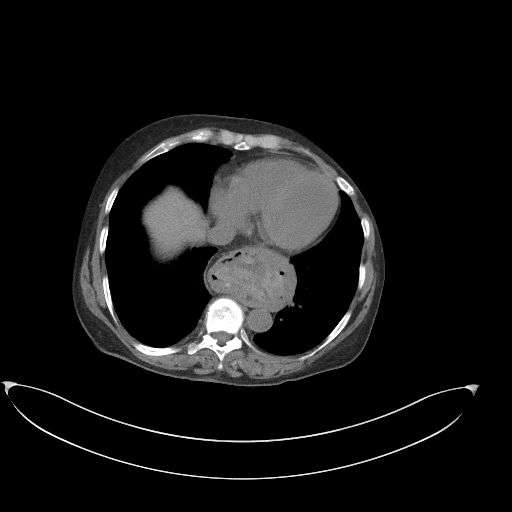

[Series 5: coronal st · coronal · 0.76mm/px · 3 of 103 slices shown]
[im 35/103  soft-tissue]
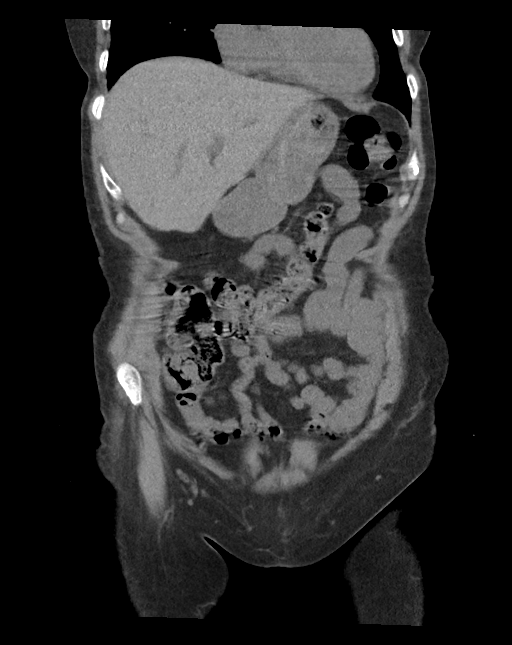
[im 46/103  soft-tissue]
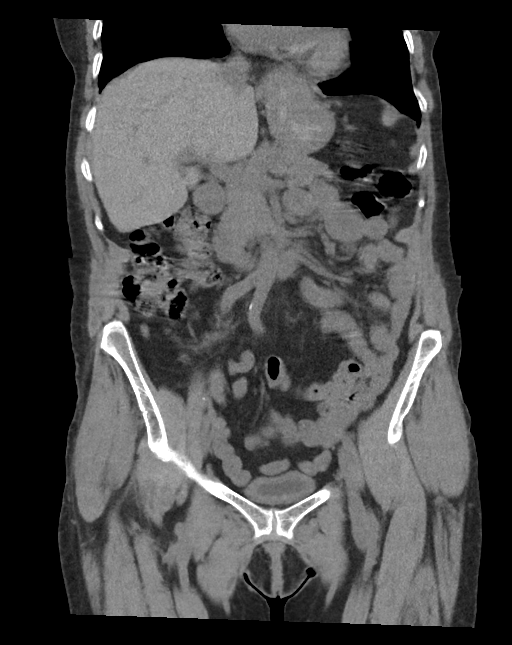
[im 57/103  soft-tissue]
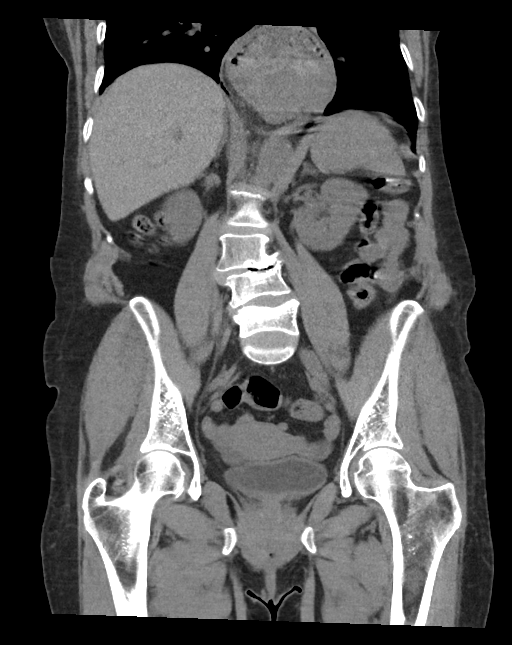

[15 of 46 positions shown; findings below may reference images not displayed]

FINDINGS: Lower chest: Mild atelectasis in the dependent right lung base.
Moderate-sized hiatal hernia.

Hepatobiliary: Borderline increased hepatic density. No focal liver
lesion on this unenhanced exam. The gallbladder is near completely
decompressed. No calcified gallstone or pericholecystic fat
stranding. No biliary dilatation.

Pancreas: Mild motion artifact through the pancreas, allowing for
this, no pancreatic inflammation. No ductal dilatation.

Spleen: Normal in size without focal abnormality.

Adrenals/Urinary Tract: Normal adrenal glands. No hydronephrosis or
renal calculi. No perinephric edema. Evidence of focal renal
abnormality on this unenhanced exam. The urinary bladder is only
minimally distended, no bladder stone. No perivesicular
inflammation.

Stomach/Bowel: Moderate-sized hiatal hernia. There is fluid/ingested
material within the stomach above and below the diaphragm. No
abnormal small bowel distension or small bowel obstruction. No small
bowel inflammation. Normal appendix. Small volume of colonic stool
without colonic inflammation.

Vascular/Lymphatic: Minimal aortic atherosclerosis. No aortic
aneurysm. No portal venous or mesenteric gas. No abdominopelvic
adenopathy.

Reproductive: Quiescent appearance of the uterus and ovaries. No
adnexal mass

Other: No ascites or free air.  No abdominal wall hernia.

Musculoskeletal: Chronic compression deformity of L1 is unchanged
from 07/07/2015 lumbar radiographs. L5 compression fracture
involving superior endplate is new from prior imaging. There is mild
sclerosis of right aspect of L5 vertebral body. Prominent Schmorl's
node within L4 superior endplate is chronic. There is scoliosis and
diffuse degenerative change. Suspected remote unfused right L3
transverse process fracture.
IMPRESSION: 1. No renal stones or obstructive uropathy.
2. L5 compression fracture involving superior endplate, new from
5277 imaging, age indeterminate. Sclerosis of right aspect of L5
vertebral body, possibly degenerative.
3. Moderate-sized hiatal hernia.
4. Borderline increased hepatic density, can be seen with iron
deposition or amiodarone therapy.

Aortic Atherosclerosis ([id number]).
